# Patient Record
Sex: Female | Born: 1965 | Race: White | Hispanic: No | Marital: Married | Smoking: Never smoker
Health system: Southern US, Community
[De-identification: ages and names within clinical notes are randomized; demographics above are authoritative.]

## PROBLEM LIST (undated history)

## (undated) DIAGNOSIS — Z8614 Personal history of Methicillin resistant Staphylococcus aureus infection: Secondary | ICD-10-CM

## (undated) DIAGNOSIS — D649 Anemia, unspecified: Secondary | ICD-10-CM

## (undated) DIAGNOSIS — F419 Anxiety disorder, unspecified: Secondary | ICD-10-CM

## (undated) DIAGNOSIS — M75120 Complete rotator cuff tear or rupture of unspecified shoulder, not specified as traumatic: Secondary | ICD-10-CM

## (undated) DIAGNOSIS — R4589 Other symptoms and signs involving emotional state: Secondary | ICD-10-CM

## (undated) DIAGNOSIS — F329 Major depressive disorder, single episode, unspecified: Secondary | ICD-10-CM

## (undated) DIAGNOSIS — F322 Major depressive disorder, single episode, severe without psychotic features: Secondary | ICD-10-CM

## (undated) DIAGNOSIS — K56609 Unspecified intestinal obstruction, unspecified as to partial versus complete obstruction: Secondary | ICD-10-CM

## (undated) DIAGNOSIS — F32A Depression, unspecified: Secondary | ICD-10-CM

## (undated) DIAGNOSIS — K219 Gastro-esophageal reflux disease without esophagitis: Secondary | ICD-10-CM

## (undated) DIAGNOSIS — Z9889 Other specified postprocedural states: Secondary | ICD-10-CM

## (undated) DIAGNOSIS — J302 Other seasonal allergic rhinitis: Secondary | ICD-10-CM

## (undated) DIAGNOSIS — R112 Nausea with vomiting, unspecified: Secondary | ICD-10-CM

## (undated) DIAGNOSIS — R4689 Other symptoms and signs involving appearance and behavior: Secondary | ICD-10-CM

## (undated) DIAGNOSIS — C44519 Basal cell carcinoma of skin of other part of trunk: Secondary | ICD-10-CM

## (undated) DIAGNOSIS — J189 Pneumonia, unspecified organism: Secondary | ICD-10-CM

## (undated) DIAGNOSIS — N261 Atrophy of kidney (terminal): Secondary | ICD-10-CM

## (undated) DIAGNOSIS — A159 Respiratory tuberculosis unspecified: Secondary | ICD-10-CM

## (undated) DIAGNOSIS — M1990 Unspecified osteoarthritis, unspecified site: Secondary | ICD-10-CM

## (undated) DIAGNOSIS — K589 Irritable bowel syndrome without diarrhea: Secondary | ICD-10-CM

## (undated) HISTORY — PX: CARPAL TUNNEL RELEASE: SHX101

## (undated) HISTORY — DX: Other symptoms and signs involving emotional state: R45.89

## (undated) HISTORY — PX: DILATION AND CURETTAGE OF UTERUS: SHX78

## (undated) HISTORY — PX: COLONOSCOPY: SHX174

## (undated) HISTORY — PX: TUBAL LIGATION: SHX77

## (undated) HISTORY — DX: Major depressive disorder, single episode, severe without psychotic features: F32.2

## (undated) HISTORY — PX: HEMORROIDECTOMY: SUR656

## (undated) HISTORY — DX: Other symptoms and signs involving appearance and behavior: R46.89

## (undated) HISTORY — DX: Basal cell carcinoma of skin of other part of trunk: C44.519

## (undated) HISTORY — PX: ABDOMINAL HYSTERECTOMY: SHX81

## (undated) HISTORY — PX: ERCP: SHX60

---

## 2000-10-11 HISTORY — PX: CHOLECYSTECTOMY: SHX55

## 2004-01-23 ENCOUNTER — Ambulatory Visit (HOSPITAL_COMMUNITY): Admission: RE | Admit: 2004-01-23 | Discharge: 2004-01-23 | Payer: Self-pay | Admitting: Internal Medicine

## 2004-01-29 ENCOUNTER — Observation Stay (HOSPITAL_COMMUNITY): Admission: RE | Admit: 2004-01-29 | Discharge: 2004-01-30 | Payer: Self-pay | Admitting: Internal Medicine

## 2004-12-07 ENCOUNTER — Ambulatory Visit: Payer: Self-pay | Admitting: Internal Medicine

## 2005-10-11 HISTORY — PX: DIAGNOSTIC LAPAROSCOPY: SUR761

## 2006-09-13 ENCOUNTER — Ambulatory Visit (HOSPITAL_COMMUNITY): Admission: RE | Admit: 2006-09-13 | Discharge: 2006-09-13 | Payer: Self-pay | Admitting: Internal Medicine

## 2006-09-13 ENCOUNTER — Ambulatory Visit: Payer: Self-pay | Admitting: Internal Medicine

## 2006-09-16 ENCOUNTER — Ambulatory Visit: Payer: Self-pay | Admitting: Internal Medicine

## 2006-09-20 ENCOUNTER — Encounter (INDEPENDENT_AMBULATORY_CARE_PROVIDER_SITE_OTHER): Payer: Self-pay | Admitting: Specialist

## 2006-09-20 ENCOUNTER — Ambulatory Visit: Payer: Self-pay | Admitting: Internal Medicine

## 2006-09-21 ENCOUNTER — Inpatient Hospital Stay (HOSPITAL_COMMUNITY): Admission: RE | Admit: 2006-09-21 | Discharge: 2006-09-25 | Payer: Self-pay | Admitting: Internal Medicine

## 2006-09-29 ENCOUNTER — Ambulatory Visit (HOSPITAL_COMMUNITY): Admission: RE | Admit: 2006-09-29 | Discharge: 2006-09-29 | Payer: Self-pay | Admitting: Internal Medicine

## 2006-10-06 ENCOUNTER — Ambulatory Visit: Payer: Self-pay | Admitting: Internal Medicine

## 2006-10-11 DIAGNOSIS — Z8614 Personal history of Methicillin resistant Staphylococcus aureus infection: Secondary | ICD-10-CM

## 2006-10-11 HISTORY — DX: Personal history of Methicillin resistant Staphylococcus aureus infection: Z86.14

## 2007-01-16 ENCOUNTER — Ambulatory Visit: Payer: Self-pay | Admitting: Internal Medicine

## 2009-04-15 ENCOUNTER — Ambulatory Visit (HOSPITAL_COMMUNITY): Admission: RE | Admit: 2009-04-15 | Discharge: 2009-04-15 | Payer: Self-pay | Admitting: Internal Medicine

## 2010-07-21 ENCOUNTER — Ambulatory Visit: Payer: Self-pay | Admitting: Internal Medicine

## 2010-08-24 ENCOUNTER — Ambulatory Visit (HOSPITAL_COMMUNITY): Admission: RE | Admit: 2010-08-24 | Discharge: 2010-08-24 | Payer: Self-pay | Admitting: Internal Medicine

## 2010-10-31 ENCOUNTER — Encounter (INDEPENDENT_AMBULATORY_CARE_PROVIDER_SITE_OTHER): Payer: Self-pay | Admitting: Internal Medicine

## 2011-02-26 NOTE — Op Note (Signed)
NAMERUNA, WHITTINGHAM               ACCOUNT NO.:  [id number]   MEDICAL RECORD NO.:  [id number]          PATIENT TYPE:  OBV   LOCATION:  A337                          FACILITY:  [facility]   PHYSICIAN:  R. Roetta Sessions, M.D. DATE OF BIRTH:  [birth year]   DATE OF PROCEDURE:  09/20/2006  DATE OF DISCHARGE:                               OPERATIVE REPORT   PROCEDURE:  ERCP with sphincterotomy.   INDICATIONS FOR PROCEDURE:  The patient is a 45 year old lady status  post distant cholecystectomy.  She was seen by [place] in early 2005 for  papillary stenosis and intermittent biliary obstruction.  She underwent  ERCP with sphincterotomy.  This alleviated her biliary symptoms  completely until recently when she has had recurrent, nearly identical,  biliary type pain with a temporal significant bump in her transaminases.   An ultrasound of the biliary tree revealed some central intrahepatic  biliary dilation.  She comes for ERCP with probable sphincterotomy as  appropriate.  This approach has been discussed with the patient and the  patient's husband at length last week and again today at the bedside.  We talked about the increased risk of pancreatitis in this setting post  procedure, risk of perforation, bleeding, reaction to medications.  Her  questions were answered, she is agreeable.  I offered her a referral in  to [facility] for intervention with biliary manometry, etc. and  she wished to have biliary decompression done here.   PROCEDURE NOTE:  The patient was placed under general anesthesia by Dr.  [facility].  The patient received Levaquin 250 mg IV prior  to the procedure.   INSTRUMENT:  Olympus video chip side-viewing duodenoscope.   FINDINGS:  Cursory examination of the distal esophagus, stomach and  duodenum through the second portion was undertaken.  The ampulla of  Vater was readily identified on the medial wall of the second portion of  the duodenum.  The scope was  pulled back to the short position 55 cm  from the incisors, scout film was taken.  Examination of the ampulla and  periampullary tissue revealed some adenomatous appearing periampullary  mucosa.  The papilla itself was pinpoint and there was no evidence of  prior sphincterotomy.  Using the __________ sphincterotome, the ampulla  was approached and using guide wire palpation a deep cannulation of the  guide wire fairly rapidly obtained.  As the sphincterotome passed  through the ampullary orifice, the thick stenosis was noted to become  dilated and there was a gush of bile with advancement with the ever so  slightly __________ sphincterotome across this orifice; please see  photos.  A cholangiogram was performed.  The biliary tree appeared  normal status post cholecystectomy, although it was noted not to drain  very well.  The sphincterotome was pulled across the ampullary orifice  at 12 o'clock position and, using the Erbe Unit, a generous  sphincterotomy was performed with excellent flow of dark yellow bile.  Contrast was reinjected into the biliary tree and the scope was pulled  back to the stomach and we watched rapid emptying of the  biliary tree.  There was no filling defect or stricture seen.  Subsequently, the  duodenoscope was reintroduced into the duodenum and biopsies of  periampullary tissue were taken for histologic study.   The pancreatic duct was not manipulated, i.e. injected (it was not  injected or cannulated).  The patient tolerated the procedure well and  is being taken to the recovery room.   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  The stenotic papilla was  somewhat dilated with passage of the sphincterotome.  Normal  cholangiogram status post cholecystectomy, although the biliary tree  would not drain well.  Status post endoscopic sphincterotomy and  periampullary tissue biopsy at the  conclusion of the procedure.  Pancreatic duct not cannulated or injected.   RECOMMENDATIONS:  1. Twenty-four hour observation.  Will advance her diet later today.      If she is doing well, will recheck labs tomorrow morning.  2. No aspirin or arthritis medications for the next 10 days.      Jonathon Bellows, M.D.  Electronically Signed     RMR/MEDQ  D:  09/20/2006  T:  09/20/2006  Job:  [id number]   cc:   Fara Chute  Fax: [phone]

## 2011-02-26 NOTE — H&P (Signed)
NAMEANSHU, WEHNER               ACCOUNT NO.:  [id number]   MEDICAL RECORD NO.:  [id number]          PATIENT TYPE:  OUT   LOCATION:  RAD                           FACILITY:  [facility]   PHYSICIAN:  R. Roetta Sessions, M.D. DATE OF BIRTH:  [birth year]   DATE OF ADMISSION:  09/29/2006  DATE OF DISCHARGE:  LH                              HISTORY & PHYSICAL   Ms. Cordle had an ERCP sphincterotomy in the last week for a papillary  stenosis.  She had pain postprocedure and found to have retroperitoneal  air on a CT.  She was treated with NG tube decompression and n.p.o.  status.  Repeat CT five days ago demonstrated less retroperitoneal air  but no sign of extravasation of oral contrast, etc.  Clinically, she  improved and was discharged.  She called the office today stating she  was still having some pain similar to that immediately post ERCP but it  had lessened significantly.  She had stopped taking any pain  medications, no fever, no chills.  She was at work when she called.  Given the potential complicated nature of this scenario, I have had her  come over to [facility] and she had a CT with IV and oral  contrast which I personally reviewed with Dr. Alver Fisher.  This study looks  really good.  Almost all her retroperitoneal has resolved.  No fluid  collection, no abscess and there was air in the biliary tree.  Her CBC  is reported to be entirely normal which I do not the report yet for  review.  I have seen her today in the surgery area.  She looks well.  Temperature 98, pulse 82, respiratory rate 18, BP 129/81.  She is not  jaundiced.  Abdomen is flat and soft.  She has a very minimal if any  right upper quadrant abdominal tenderness.   ASSESSMENT:  Resolving retroperitoneal irritation/perforation.  This has  more than likely sealed several days ago.  She continues on Cipro and  Flagyl.  She is eating.  We called in a prescription for Vicodin 5/500  one q.6h.  She has Darvocet on hand  for migraine headaches.  She wants  to use Darvocet and use Vicodin for a backup.  Plan to see her back in  the office next week.      Jonathon Bellows, M.D.  Electronically Signed     RMR/MEDQ  D:  09/29/2006  T:  09/29/2006  Job:  [id number]   cc:   Patient's chart

## 2011-02-26 NOTE — Discharge Summary (Signed)
Marie Hurley, WEIGAND               ACCOUNT NO.:  [id number]   MEDICAL RECORD NO.:  [id number]          PATIENT TYPE:  INP   LOCATION:  A337                          FACILITY:  [facility]   PHYSICIAN:  Kassie Mends, M.D.      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   HOSPITAL COURSE:  Ms. Fojtik is a 45 year old female who has a  significant past medical history of having a cholecystectomy.  She was  seen in 2005 for papillary stenosis.  She had an ERCP with  sphincterotomy which alleviated her biliary symptoms.  She began to have  identical biliary type pain, with a bump in her transaminases.  An  ultrasound revealed central intrahepatic biliary dilatation.  She  presented for ERCP on September 20, 2006.  On the day postprocedure, she  developed nausea, vomiting, and abdominal pain.  She was afebrile and  hemodynamically stable.  CT scan of the abdomen and pelvis revealed  extensive retroperitoneal gas in the upper abdomen, predominantly in the  right anterior perirenal and perinephric spaces.  An NG tube was placed.  Surgery was consulted.  She was started on Cipro and Flagyl.  Her white  count was elevated at 11.9, with 85% neutrophils.  She remained on Cipro  and Flagyl and n.p.o. until repeat CT scan was performed on hospital day  4.  The CT scan of the abdomen without IV contrast but with p.o.  contrast again showed extensive retroperitoneal air which had decreased  and no extravasation of oral contrast.  The NG tube was removed, and her  diet was advanced.  On the day of discharge, she had no nausea,  vomiting, or abdominal pain.  She was tolerating a regular diet.  She  was  not requiring any antiemetics or pain medicine.  She was afebrile  and hemodynamically stable.  Her white count was 6.9.  She did have a  potassium of 3.2.  She is discharged on oral potassium supplementation  and continued on antibiotics to complete a 7-day course.   DISCHARGE MEDICATIONS:  1. Cipro 500 mg p.o. q.12 h. for 3 days.  2. Flagyl 500 mg q.8 h. for 3 days.  3. Compazine 10 mg q.8 h. as needed for nausea and vomiting.   FOLLOWUP:  Dr. Jena Gauss will contact Ms. Delford Field to schedule a follow-up  appointment.      Kassie Mends, M.D.  Electronically Signed     SM/MEDQ  D:  09/26/2006  T:  09/27/2006  Job:  [id number]   cc:   Fara Chute  Fax: [phone]   R. Roetta Sessions, M.D.  [address]  [place]  [place] [address]

## 2011-02-26 NOTE — Op Note (Signed)
NAME:  Marie Hurley, Marie Hurley                         ACCOUNT NO.:  [id number]   MEDICAL RECORD NO.:  [id number]                   PATIENT TYPE:  AMB   LOCATION:  DAY                                  FACILITY:  [facility]   PHYSICIAN:  R. Roetta Sessions, M.D.              DATE OF BIRTH:  Jan 10, 1966   DATE OF PROCEDURE:  DATE OF DISCHARGE:                                 OPERATIVE REPORT   PROCEDURE:  Diagnostic endoscopic retrograde cholangiopancreatography (ERCP)  with sphincterotomy and balloon dredging of bile duct.   INDICATIONS FOR PROCEDURE:  The patient is a 45 year old lady with right  upper quadrant biliary-type pain; and, on at least 2 episodes of significant  elevation in her transaminases was noted.  MRCP suggested a meniscus/cutoff  distal CBD suggestive of a stone.  She has continued to have waxing and  waning symptoms even through repeat LFTs came back normal.  ERCP is now  being done to further evaluate her symptoms.  I talked to her and her  husband at length about the likelihood of sphincterotomy with balloon  dredging of the bile duct, potential for pancreatic duct stent placement.  We talked about at least a 1 in 10 chance of pancreatitis as well as a risk  of perforation, bleeding, interaction to medications.  All questions were  answered.  All parties were agreeable.  Please see the documentation in the  medical record.   PROCEDURE NOTE:  The patient was placed in semi-prone position on the  fluoroscopy table in the radiology department.  O2 saturation, blood  pressure, and pulse oximetry were monitored throughout the entire procedure.   CONSCIOUS SEDATION:  Versed 5 mg IV, Demerol 100 mg IV in divided doses.  Cetacaine spray for topical oropharyngeal anesthesia.  Levaquin 500 mg IV  prior to procedure.   INSTRUMENT:  Olympus, video chip, therapeutic, side-viewing duodenoscope.   FINDINGS:  A cursory examination of the distal esophagus, stomach, and  duodenum revealed no  abnormalities. The ampulla of Vater was readily  identified on the medial wall of the second portion of the duodenum.  It was  somewhat bulging, but otherwise appeared normal.  The scope was pulled back  to the short position, 55 cm from the incisors.  A scout film was taken.  Using the Microvasive sphincterotome the ampulla was impacted and a deep  cannulation of the biliary tree was easily accomplished.   Cholangiogram was performed.  Cholangiogram revealed an undilated biliary  tree.  There were no obvious filling defects seen initially status post  cholecystectomy.  The pancreatic duct could not be injected.  The catheter  was pulled out of the biliary tree and the biliary tree was observed. It  appeared not to drain well at all.  Subsequently the sphincter and catheter  was reintroduced into the bile duct.  A 0.035 safety wire was placed deep in  the biliary tree.  Under fluoroscopic  control a sphincterotomy was performed  at the 12 o'clock position using the Erbie unit.  This was done without  difficulty.  There appeared to be a very good flow of contrast and bile  after this maneuver was accomplished.  I railed a graduated balloon deep  into the biliary tree at the level of the common hepatic duct and partially  inflated it and did a balloon occlusion cholangiogram. No stone material was  recovered; but, again, the bile duct appeared to drain very well.   I was planing to put a pancreatic duct stent in to protect the pancreas;  however. Because the biliary portion of the procedure went very well and I  was unable to initially inject the pancreatic duct, I elected not to pursue  a pancreatogram/PD stent placement.  The patient tolerated the relatively  brief procedure well and was reacted in the radiology unit.   IMPRESSION:  Somewhat bulging appearing ampulla of Vater.  Cholangiogram  appeared normal, status post cholecystectomy.  Bile duct appeared not to  drain spontaneously  status post endoscopic sphincterotomy with balloon  dredging.  Pancreatic duct not injected.   RECOMMENDATIONS:  1. A 15-minute delayed film pending.  2. Review of all films with the radiologist, pending.   PLAN:  1. Plan will be overnight observation.  2. Recheck amylase and LFTs tomorrow morning.  3. Further recommendations to follow.      ___________________________________________                                            Jonathon Bellows, M.D.   RMR/MEDQ  D:  01/29/2004  T:  01/29/2004  Job:  [id number]   cc:   Almetta Lovely  [address], [address]  [place]  [place] [address]  Fax: [phone]   Fara Chute  [address] [place]  [place] [address]  Fax: [phone]   R. Roetta Sessions, M.D.  [address]  [place]  [place] [address]  Fax: [phone]

## 2011-03-01 ENCOUNTER — Ambulatory Visit (INDEPENDENT_AMBULATORY_CARE_PROVIDER_SITE_OTHER): Payer: PRIVATE HEALTH INSURANCE | Admitting: Internal Medicine

## 2011-03-01 DIAGNOSIS — R1011 Right upper quadrant pain: Secondary | ICD-10-CM

## 2011-03-01 DIAGNOSIS — R1013 Epigastric pain: Secondary | ICD-10-CM

## 2011-03-01 DIAGNOSIS — K589 Irritable bowel syndrome without diarrhea: Secondary | ICD-10-CM

## 2011-04-12 ENCOUNTER — Ambulatory Visit (INDEPENDENT_AMBULATORY_CARE_PROVIDER_SITE_OTHER): Payer: Self-pay | Admitting: Internal Medicine

## 2011-06-07 ENCOUNTER — Other Ambulatory Visit (INDEPENDENT_AMBULATORY_CARE_PROVIDER_SITE_OTHER): Payer: Self-pay | Admitting: *Deleted

## 2011-06-07 DIAGNOSIS — R1011 Right upper quadrant pain: Secondary | ICD-10-CM

## 2011-06-07 DIAGNOSIS — R194 Change in bowel habit: Secondary | ICD-10-CM

## 2011-06-07 MED ORDER — NORTRIPTYLINE HCL 10 MG PO CAPS: ORAL_CAPSULE | ORAL | Status: DC

## 2011-06-07 NOTE — Telephone Encounter (Signed)
Prescription last filled 04/23/2011

## 2011-06-25 ENCOUNTER — Telehealth (INDEPENDENT_AMBULATORY_CARE_PROVIDER_SITE_OTHER): Payer: Self-pay | Admitting: *Deleted

## 2011-06-25 NOTE — Telephone Encounter (Signed)
Toshi left message, wants [name] to call, she has question about a couple of prescriptions, wants you to call her today

## 2011-06-25 NOTE — Telephone Encounter (Signed)
Rec'd fax request from [facility] for refills on Clonazepam 1 mg #90 - 1 Tablet Three Times daily As Needed- last filled 05/25/2011, and Hydrocodone/APAP/5/500 #20 - Take 1 Tablet Once Daily as Needed for Migraine Headache. Last filled 05/26/11. I gave this to Delrae Rend to address and she called Dr. Karilyn Cota. She states that per Dr. Karilyn Cota the patient will need to have PCP address these prescriptions. I called [place] and made her aware.

## 2011-07-11 ENCOUNTER — Emergency Department (HOSPITAL_COMMUNITY)
Admission: EM | Admit: 2011-07-11 | Discharge: 2011-07-11 | Disposition: A | Discharge status: Home / Self Care | Payer: PRIVATE HEALTH INSURANCE | Attending: Emergency Medicine | Admitting: Emergency Medicine

## 2011-07-11 ENCOUNTER — Emergency Department (HOSPITAL_COMMUNITY): Payer: PRIVATE HEALTH INSURANCE

## 2011-07-11 ENCOUNTER — Encounter: Payer: Self-pay | Admitting: *Deleted

## 2011-07-11 DIAGNOSIS — G43909 Migraine, unspecified, not intractable, without status migrainosus: Secondary | ICD-10-CM | POA: Insufficient documentation

## 2011-07-11 DIAGNOSIS — Z79899 Other long term (current) drug therapy: Secondary | ICD-10-CM | POA: Insufficient documentation

## 2011-07-11 DIAGNOSIS — R1084 Generalized abdominal pain: Secondary | ICD-10-CM | POA: Insufficient documentation

## 2011-07-11 DIAGNOSIS — K219 Gastro-esophageal reflux disease without esophagitis: Secondary | ICD-10-CM | POA: Insufficient documentation

## 2011-07-11 DIAGNOSIS — R630 Anorexia: Secondary | ICD-10-CM | POA: Insufficient documentation

## 2011-07-11 DIAGNOSIS — K589 Irritable bowel syndrome without diarrhea: Secondary | ICD-10-CM | POA: Insufficient documentation

## 2011-07-11 HISTORY — DX: Gastro-esophageal reflux disease without esophagitis: K21.9

## 2011-07-11 HISTORY — DX: Unspecified intestinal obstruction, unspecified as to partial versus complete obstruction: K56.609

## 2011-07-11 HISTORY — DX: Irritable bowel syndrome, unspecified: K58.9

## 2011-07-11 LAB — DIFFERENTIAL
Basophils Absolute: 0 10*3/uL (ref 0.0–0.1)
Basophils Relative: 1 % (ref 0–1)
Eosinophils Relative: 6 % — ABNORMAL HIGH (ref 0–5)
Lymphocytes Relative: 50 % — ABNORMAL HIGH (ref 12–46)
Neutro Abs: 2.9 10*3/uL (ref 1.7–7.7)

## 2011-07-11 LAB — CBC
HCT: 36.4 % (ref 36.0–46.0)
Hemoglobin: 12.2 g/dL (ref 12.0–15.0)
MCH: 30 pg (ref 26.0–34.0)
MCHC: 33.5 g/dL (ref 30.0–36.0)
MCV: 89.4 fL (ref 78.0–100.0)
Platelets: 388 K/uL (ref 150–400)
RBC: 4.07 MIL/uL (ref 3.87–5.11)
RDW: 13.4 % (ref 11.5–15.5)
WBC: 8.2 K/uL (ref 4.0–10.5)

## 2011-07-11 LAB — BASIC METABOLIC PANEL WITH GFR
BUN: 4 mg/dL — ABNORMAL LOW (ref 6–23)
CO2: 32 meq/L (ref 19–32)
Calcium: 9.8 mg/dL (ref 8.4–10.5)
Chloride: 101 meq/L (ref 96–112)
Creatinine, Ser: 0.83 mg/dL (ref 0.50–1.10)
GFR calc Af Amer: >60 mL/min (ref >60)
GFR calc non Af Amer: >60 mL/min (ref >60)
Glucose, Bld: 86 mg/dL (ref 70–99)
Potassium: 3.7 meq/L (ref 3.5–5.1)
Sodium: 140 meq/L (ref 135–145)

## 2011-07-11 LAB — HEPATIC FUNCTION PANEL
ALT: 19 U/L (ref 0–35)
AST: 17 U/L (ref 0–37)
Albumin: 3.9 g/dL (ref 3.5–5.2)
Alkaline Phosphatase: 58 U/L (ref 39–117)
Bilirubin, Direct: 0.1 mg/dL (ref 0.0–0.3)
Indirect Bilirubin: 0.1 mg/dL — ABNORMAL LOW (ref 0.3–0.9)
Total Bilirubin: 0.2 mg/dL — ABNORMAL LOW (ref 0.3–1.2)
Total Protein: 7.1 g/dL (ref 6.0–8.3)

## 2011-07-11 LAB — URINALYSIS, ROUTINE W REFLEX MICROSCOPIC
Bilirubin Urine: NEGATIVE
Glucose, UA: NEGATIVE mg/dL
Hgb urine dipstick: NEGATIVE
Ketones, ur: NEGATIVE mg/dL
Leukocytes, UA: NEGATIVE
Nitrite: NEGATIVE
Protein, ur: NEGATIVE mg/dL
Specific Gravity, Urine: 1.01 (ref 1.005–1.030)
Urobilinogen, UA: 0.2 mg/dL (ref 0.0–1.0)
pH: 7.5 (ref 5.0–8.0)

## 2011-07-11 MED ORDER — SODIUM CHLORIDE 0.9 % IV SOLN
999.0000 mL | Freq: Once | INTRAVENOUS | Status: AC
  Administered 2011-07-11: 999 mL via INTRAVENOUS

## 2011-07-11 MED ORDER — ONDANSETRON HCL 4 MG/2ML IJ SOLN
4.0000 mg | Freq: Once | INTRAMUSCULAR | Status: AC
  Administered 2011-07-11: 4 mg via INTRAVENOUS
  Filled 2011-07-11: qty 2

## 2011-07-11 MED ORDER — HYDROMORPHONE HCL 1 MG/ML IJ SOLN
1.0000 mg | Freq: Once | INTRAMUSCULAR | Status: AC
  Administered 2011-07-11: 1 mg via INTRAVENOUS
  Filled 2011-07-11: qty 1

## 2011-07-11 MED ORDER — ONDANSETRON HCL 4 MG PO TABS: 4.0000 mg | ORAL_TABLET | Freq: Four times a day (QID) | ORAL | Status: AC

## 2011-07-11 NOTE — ED Notes (Signed)
Pt has significant hx of bowel obstructions per pt.

## 2011-07-11 NOTE — ED Notes (Signed)
Upper abd pain since 1500, nausea

## 2011-07-11 NOTE — ED Provider Notes (Signed)
History     CSN: [id number] Arrival date & time: 07/11/2011  3:29 AM  Chief Complaint  Patient presents with  . Abdominal Pain    (Consider location/radiation/quality/duration/timing/severity/associated sxs/prior treatment) HPI Comments: Examined at 36.  Patient is a 45 y.o. female presenting with abdominal pain. The history is provided by the patient.  Abdominal Pain The primary symptoms of the illness include abdominal pain. The current episode started 3 to 5 hours ago. The onset of the illness was sudden. The problem has not changed since onset. The abdominal pain is located in the LUQ, RUQ and epigastric region. The abdominal pain does not radiate. The severity of the abdominal pain is 9/10. The abdominal pain is relieved by nothing. The abdominal pain is exacerbated by coughing and deep breathing.  The patient states that she believes she is currently not pregnant. The patient has not had a change in bowel habit. Additional symptoms associated with the illness include anorexia. Symptoms associated with the illness do not include chills, diaphoresis, heartburn, urgency, hematuria or back pain. Significant associated medical issues include GERD.    Past Medical History  Diagnosis Date  . Bowel obstruction   . IBS (irritable bowel syndrome)   . GERD (gastroesophageal reflux disease)   . Migraine     Past Surgical History  Procedure Date  . Tubal ligation   . Cholecystectomy   . Carpal tunnel release   . Ercp     No family history on file.  History  Substance Use Topics  . Smoking status: Never Smoker   . Smokeless tobacco: Not on file  . Alcohol Use: 0.5 oz/week    1 drink(s) per week    OB History    Grav Para Term Preterm Abortions TAB SAB Ect Mult Living                  Review of Systems  Constitutional: Negative for chills and diaphoresis.  Gastrointestinal: Positive for abdominal pain and anorexia. Negative for heartburn.  Genitourinary: Negative for  urgency and hematuria.  Musculoskeletal: Negative for back pain.  All other systems reviewed and are negative.    Allergies  Penicillins and Phenergan  Home Medications   Current Outpatient Rx  Name Route Sig Dispense Refill  . CLONAZEPAM 1 MG PO TABS Oral Take 1 mg by mouth 3 (three) times daily as needed.      Marland Kitchen DICYCLOMINE HCL 10 MG PO CAPS Oral Take 10 mg by mouth 4 (four) times daily -  before meals and at bedtime.      Marland Kitchen ESOMEPRAZOLE MAGNESIUM 40 MG PO CPDR Oral Take 40 mg by mouth 2 times daily at 12 noon and 4 pm.      . HYDROCODONE-ACETAMINOPHEN 5-500 MG PO CAPS Oral Take 1 capsule by mouth every 6 (six) hours as needed.      Marland Kitchen HYOSCYAMINE SULFATE 0.375 MG PO TB12 Oral Take 0.375 mg by mouth every 12 (twelve) hours as needed.      Marland Kitchen NORTRIPTYLINE HCL 10 MG PO CAPS Oral Take 10 mg by mouth at bedtime.      Marland Kitchen NORTRIPTYLINE HCL 10 MG PO CAPS  Take 1 Capsule In The Morning And 2 In The Evening 90 capsule 3    BP 140/88  Pulse 89  Temp(Src) 97.8 F (36.6 C) (Oral)  Resp 15  Ht 5\' 3"  (1.6 m)  Wt 127 lb (57.607 kg)  BMI 22.50 kg/m2  SpO2 100%  LMP 06/24/2011  Physical Exam  Nursing note and vitals reviewed. Constitutional: She is oriented to person, place, and time. She appears well-developed and well-nourished. She appears distressed.  HENT:  Head: Normocephalic.  Eyes: EOM are normal.  Neck: Normal range of motion. Neck supple.  Cardiovascular: Normal rate, normal heart sounds and intact distal pulses.   Pulmonary/Chest: Effort normal and breath sounds normal.  Abdominal: Soft. She exhibits no mass. There is tenderness. There is no rebound and no guarding.  Musculoskeletal: Normal range of motion.  Neurological: She is alert and oriented to person, place, and time.  Skin: Skin is warm and dry.    ED Course  Procedures (including critical care time)  Labs Reviewed  DIFFERENTIAL - Abnormal; Notable for the following:    Neutrophils Relative 36 (*)     Lymphocytes Relative 50 (*)    Lymphs Abs 4.1 (*)    Eosinophils Relative 6 (*)    All other components within normal limits  BASIC METABOLIC PANEL - Abnormal; Notable for the following:    BUN 4 (*)    All other components within normal limits  HEPATIC FUNCTION PANEL - Abnormal; Notable for the following:    Total Bilirubin 0.2 (*)    Indirect Bilirubin 0.1 (*)    All other components within normal limits  CBC  URINALYSIS, ROUTINE W REFLEX MICROSCOPIC   Dg Abd Acute W/chest  07/11/2011  *RADIOLOGY REPORT*  Clinical Data: Abdominal pain and nausea.  ACUTE ABDOMEN SERIES (ABDOMEN 2 VIEW & CHEST 1 VIEW)  Comparison: CT 09/29/2006  Findings: Normal heart size and pulmonary vascularity.  No focal consolidation in the lungs.  No blunting of costophrenic angles.  Scattered gas and stool in the colon.  Scattered air-fluid levels in the colon suggesting liquid stool.  No small or large bowel dilatation.  No free intra-abdominal air.  No radiopaque stones.  IMPRESSION: No evidence of active pulmonary disease.  Nonobstructive bowel gas pattern.  Liquid stool in the colon.  Original Report Authenticated By: Marlon Pel, M.D.     No diagnosis found.    Patient with abdominal pain that began last night associated with nausea. No vomiting, diarrhea, fever, chills, back pain. Patient with h/o IBS and GERD. Labs unremarkable, xrays with colon suggesting liquid stool. Patient given IVF, antiemetic and analgesic with relief of pain. Reviewed results with patient.Pt stable in ED with no significant deterioration in condition.Pt feels improved after observation and/or treatment in ED. MDM Reviewed: previous chart, nursing note and vitals Reviewed previous: labs and x-ray Interpretation: labs and x-ray Total time providing critical care: 40.          Nicoletta Dress. Colon Branch, MD 07/11/11 1610

## 2011-08-25 ENCOUNTER — Telehealth (INDEPENDENT_AMBULATORY_CARE_PROVIDER_SITE_OTHER): Payer: Self-pay | Admitting: Internal Medicine

## 2011-08-25 NOTE — Telephone Encounter (Signed)
Needs her Nortriptyline refilled. Would like to see if the doctor would please up the mg. The return phone number is [phone].

## 2011-08-25 NOTE — Telephone Encounter (Signed)
I forwarded this to Terri to address as Dr. Karilyn Cota is out of the office.

## 2011-08-25 NOTE — Telephone Encounter (Signed)
I advised her to call Dr. Sherril Croon office for the increase

## 2011-09-06 ENCOUNTER — Ambulatory Visit (INDEPENDENT_AMBULATORY_CARE_PROVIDER_SITE_OTHER): Payer: PRIVATE HEALTH INSURANCE | Admitting: Internal Medicine

## 2011-09-13 ENCOUNTER — Other Ambulatory Visit: Payer: Self-pay | Admitting: Obstetrics & Gynecology

## 2011-09-23 ENCOUNTER — Other Ambulatory Visit (INDEPENDENT_AMBULATORY_CARE_PROVIDER_SITE_OTHER): Payer: Self-pay | Admitting: Internal Medicine

## 2011-09-23 DIAGNOSIS — K219 Gastro-esophageal reflux disease without esophagitis: Secondary | ICD-10-CM

## 2011-10-20 ENCOUNTER — Telehealth (INDEPENDENT_AMBULATORY_CARE_PROVIDER_SITE_OTHER): Payer: Self-pay | Admitting: *Deleted

## 2011-10-20 NOTE — Telephone Encounter (Signed)
Marie Hurley called and says that she is having abdominal tenderness. Recently had a Physical by her PCP and she felt that she Marie Hurley) needed to see another Gastroenterologist for further evaluation of this and also she needed a Surgical Referral for excessive hemorrhoids. Marie Hurley had an appointment with Gastroenterologist in [place] on the same day that she had an appointment here but canceled both. She says that she wants to stay with Dr. Karilyn Cota and ask if he would make a referral to a Surgeon. I told her that I would ask Dr. Karilyn Cota and get back to her. She also mentioned that the next appointment that she had been given here was 12-21-11.  I spoke with Dr.Rehman and he states that he will need to re-evaluate her before making an appointment for a Surgical Consult. An appointment has been given for January 21 @ 2:30 PM, Marie Hurley was called and made aware.

## 2011-11-01 ENCOUNTER — Ambulatory Visit (INDEPENDENT_AMBULATORY_CARE_PROVIDER_SITE_OTHER): Payer: PRIVATE HEALTH INSURANCE | Admitting: Internal Medicine

## 2011-11-01 ENCOUNTER — Other Ambulatory Visit (INDEPENDENT_AMBULATORY_CARE_PROVIDER_SITE_OTHER): Payer: Self-pay | Admitting: *Deleted

## 2011-11-01 ENCOUNTER — Encounter (INDEPENDENT_AMBULATORY_CARE_PROVIDER_SITE_OTHER): Payer: Self-pay | Admitting: Internal Medicine

## 2011-11-01 VITALS — BP 130/68 | HR 66 | Temp 97.8°F | Resp 12 | Ht 63.0 in | Wt 129.0 lb

## 2011-11-01 DIAGNOSIS — R1011 Right upper quadrant pain: Secondary | ICD-10-CM | POA: Insufficient documentation

## 2011-11-01 DIAGNOSIS — G43909 Migraine, unspecified, not intractable, without status migrainosus: Secondary | ICD-10-CM | POA: Insufficient documentation

## 2011-11-01 DIAGNOSIS — K649 Unspecified hemorrhoids: Secondary | ICD-10-CM | POA: Insufficient documentation

## 2011-11-01 DIAGNOSIS — R1010 Upper abdominal pain, unspecified: Secondary | ICD-10-CM | POA: Insufficient documentation

## 2011-11-01 DIAGNOSIS — R109 Unspecified abdominal pain: Secondary | ICD-10-CM

## 2011-11-01 DIAGNOSIS — K589 Irritable bowel syndrome without diarrhea: Secondary | ICD-10-CM

## 2011-11-01 MED ORDER — HYOSCYAMINE SULFATE ER 0.375 MG PO TB12: 375.0000 ug | ORAL_TABLET | Freq: Two times a day (BID) | ORAL | Status: DC

## 2011-11-01 MED ORDER — NORTRIPTYLINE HCL 25 MG PO CAPS: 25.0000 mg | ORAL_CAPSULE | Freq: Two times a day (BID) | ORAL | Status: DC

## 2011-11-01 NOTE — Patient Instructions (Signed)
Take hyoscyamine points 375 mg twice daily. Please note dicyclomine has been discontinued. Pamelor increased to 25 mg twice daily by mouth. Consultation with Dr. Malvin Johns regarding problem with hemorrhoids. Physician will contact you with results of CT.

## 2011-11-01 NOTE — Progress Notes (Signed)
Presenting complaint; Abdomen hemorrhoids. Recurrent upper abdominal pain. Subjective: Marie Hurley is 46 year old Caucasian female who is here for scheduled visit. She was last seen in may 2012 for recurrent RUQ and epigastric pain as well as irregular bowel movements. She continues to experience pain in her upper abdomen right often if not every day. Now she is also complaining of having problem with hemorrhoids. She has soreness in a recurrent rectal bleeding. She has tried various topicals but nothing has worked. She either has diarrhea and/or constipation and she has bleeding with either situation. She denies frank bleeding or melena. Her appetite is very good but she always watching her diet and staying on bland foods. Her weight has been stable since her last visit. 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 On spell in September 2012 landed her in emergency room when she was seen by Dr. Greta Doom and a blood work was normal including LFTs she's had 3 more spells of pain but she decided to stay at home. She generally recovers in 2-3 days. He also complains of flatulence and lower back pain in she's constipated she continues to have intermittent episodes of migraine which also seemed to trigger abdominal pain. She feels quite frustrated with her ongoing symptoms as she is unable to work. Current Medications: Current Outpatient Prescriptions  Medication Sig Dispense Refill  . Ascorbic Acid (VITAMIN C) 1000 MG tablet Take 1,000 mg by mouth daily.      Marland Kitchen b complex vitamins capsule Take 1 capsule by mouth daily.      . clonazePAM (KLONOPIN) 1 MG tablet Take 1 mg by mouth 3 (three) times daily as needed.        . dicyclomine (BENTYL) 10 MG capsule Take 10 mg by mouth 4 (four) times daily -  before meals and at bedtime.        . hydrocodone-acetaminophen (LORCET-HD) 5-500 MG  per capsule Take 1 capsule by mouth every 6 (six) hours as needed.        . hyoscyamine (LEVBID) 0.375 MG 12 hr tablet Take 0.375 mg by mouth every 12 (twelve) hours as needed.        . Multiple Vitamin (DAILY VITAMIN PO) Take by mouth daily.      Marland Kitchen NEXIUM 40 MG capsule TAKE (1) CAPSULE TWICE DAILY.  60 each  5  . nortriptyline (PAMELOR) 10 MG capsule Take 1 Capsule In The Morning And 2 In The Evening  90 capsule  3  . polyethylene glycol (MIRALAX / GLYCOLAX) packet Take 17 g by mouth 2 (two) times daily.        Objective: Blood pressure 130/68, pulse 66, temperature 97.8 F (36.6 C), temperature source Oral, resp. rate 12, height 5\' 3"  (1.6 m), weight 129 lb (58.514 kg), last menstrual period 09/23/2011. Conjunctiva is pink. Sclera is nonicteric Oral pharyngeal mucosa is normal. No neck masses or thyromegaly noted. Cardiac exam with regular rhythm normal S1 and S2. No murmur or gallop noted. Lungs are clear to auscultation. Abdomen is symmetrical. Bowel sounds are hyperactive. No bruits noted. On palpation soft abdomen with moderate tenderness across upper abdomen with some guarding in mid epigastric region. No superficial tenderness noted. No organomegaly or masses. Rectal examination reveals prominent skin tag and visible Cosa of the dentate line which is intensely erythematous no active bleeding noted. Digital exam reveals  no stool in the vault. No clubbing or edema noted. No LE edema or clubbing noted.  Assessment: #1. Recurrent hematochezia felt to be secondary to hemorrhoids; she appears to have both internal and external hemorrhoids. She will be referred for surgical opinion. Her last colonoscopy was about 10 years ago. Unfortunately this problem will come back unless her bowels move regularly. #2. Chronic upper abdominal pain with intermittent episodes of intractable right upper quadrant biliary type of pain but LFTs have been repeatedly normal. She had ultrasound in November 2011  revealing normal-size common bile duct. Some of her symptoms are suggestive of biliary colic and left-sided pain may be secondary to IBS. Please note patient had 2 ERCPs with sphincterotomy by Dr. Jena Gauss. First procedure provider Nedra Hai for about 2 years for the second one did not. She was sent over to [facility] over 2 years ago also seen by Dr. Parks Ranger group at [facility]. I believe her abdomen needs to be surveyed with CT to make sure she has developed chronic pancreatitis or other abnormalities.    Plan: Surgical consultation for further treatment of her hemorrhoids. Increase Pamelor to 25 mg by mouth twice a day which is primarily for her abdominal pain but may also help with migraine Abdominopelvic CT with contrast. I told patient that I will confer with Dr. Danny Lawless of [place] to determine if she would benefit from biliary manometry.

## 2011-11-08 ENCOUNTER — Ambulatory Visit (HOSPITAL_COMMUNITY): Payer: PRIVATE HEALTH INSURANCE

## 2011-11-08 ENCOUNTER — Telehealth (INDEPENDENT_AMBULATORY_CARE_PROVIDER_SITE_OTHER): Payer: Self-pay | Admitting: *Deleted

## 2011-11-08 NOTE — Telephone Encounter (Signed)
LM stating she would like to speak with Reba. She has questions about apts Dr. Karilyn Cota has made for her. The return phone number is [phone].

## 2011-11-09 NOTE — Telephone Encounter (Signed)
This really needs to go to [place] as I am unaware of the appts we made for patient while in the office.  I will have to talk with Dewayne Hatch or Dewayne Hatch can call patient to discuss these appts.  

## 2011-11-09 NOTE — Telephone Encounter (Signed)
Spoke to [place] and answered questions

## 2011-11-10 ENCOUNTER — Ambulatory Visit (HOSPITAL_COMMUNITY): Payer: PRIVATE HEALTH INSURANCE

## 2011-11-17 ENCOUNTER — Ambulatory Visit (HOSPITAL_COMMUNITY): Payer: PRIVATE HEALTH INSURANCE

## 2011-11-23 ENCOUNTER — Ambulatory Visit (INDEPENDENT_AMBULATORY_CARE_PROVIDER_SITE_OTHER): Payer: PRIVATE HEALTH INSURANCE | Admitting: Internal Medicine

## 2011-11-25 ENCOUNTER — Telehealth (INDEPENDENT_AMBULATORY_CARE_PROVIDER_SITE_OTHER): Payer: Self-pay | Admitting: *Deleted

## 2011-11-25 DIAGNOSIS — R140 Abdominal distension (gaseous): Secondary | ICD-10-CM

## 2011-11-25 DIAGNOSIS — K589 Irritable bowel syndrome without diarrhea: Secondary | ICD-10-CM

## 2011-11-25 NOTE — Telephone Encounter (Signed)
Patient left a voice message at 4:44 pm , stating that she is having another episode with her stomach. She wants to know if she should go for lab work or have done the CT that she has not had done yet or be worked in to see doctor tomorrow. At 4:50 pm I talked with Humna she states that she and her husband went out to eat for anniversary. As she was eating her stomach began to swell or expand. She says she looks like she is 4 months pregnant. Can't pass gas, had a small BM this morning and has the feeling that she needs to have BM but she can't. I told her that I would talk with Dr. Karilyn Cota and call her back. 5:02 pm - Per Dr. Karilyn Cota the patient needs to have LFT's drawn now. Lab order noted , faxed and patient made aware.

## 2011-11-26 LAB — HEPATIC FUNCTION PANEL
Albumin: 4.1 g/dL (ref 3.5–5.2)
Alkaline Phosphatase: 55 U/L (ref 39–117)
Bilirubin, Direct: 0.1 mg/dL (ref 0.0–0.3)
Indirect Bilirubin: 0.2 mg/dL (ref 0.0–0.9)
Total Bilirubin: 0.3 mg/dL (ref 0.3–1.2)

## 2011-12-02 ENCOUNTER — Ambulatory Visit (HOSPITAL_COMMUNITY): Payer: PRIVATE HEALTH INSURANCE

## 2011-12-07 ENCOUNTER — Ambulatory Visit (HOSPITAL_COMMUNITY): Payer: PRIVATE HEALTH INSURANCE

## 2011-12-09 ENCOUNTER — Telehealth (INDEPENDENT_AMBULATORY_CARE_PROVIDER_SITE_OTHER): Payer: Self-pay | Admitting: *Deleted

## 2011-12-09 NOTE — Telephone Encounter (Addendum)
Emaline left a voice message on 12-08-11. She is asking if we have heard anything from Dr. Fredric Mare as to whom he may want to refer her to to do the procedure,since she cannot go to [facility].She ask for a call at [phone].Forwarded to Dr. Karilyn Cota to address

## 2011-12-09 NOTE — Telephone Encounter (Signed)
I haven't heard anything, may need to send to Dr Karilyn Cota, he has talked to Dr Marilynne Drivers

## 2011-12-09 NOTE — Telephone Encounter (Signed)
Marie Hurley, please call [name]; patient needs ov with Dr. Logan Bores for ERCP with biliary manometry.

## 2011-12-09 NOTE — Telephone Encounter (Signed)
Caitrin called back and wants to sch'd appt to see Dr Karilyn Cota to talk about ERCP, appt sch'd 01/03/12, she is aware

## 2011-12-09 NOTE — Telephone Encounter (Signed)
Per [name], they don't do ERCP w/ biliary manometry, [facility] is the only one that does those, Kailah will think about it and let me know

## 2011-12-10 ENCOUNTER — Ambulatory Visit (HOSPITAL_COMMUNITY)
Admission: RE | Admit: 2011-12-10 | Discharge: 2011-12-10 | Disposition: A | Discharge status: Home / Self Care | Payer: PRIVATE HEALTH INSURANCE | Source: Ambulatory Visit | Attending: Internal Medicine | Admitting: Internal Medicine

## 2011-12-10 DIAGNOSIS — N83209 Unspecified ovarian cyst, unspecified side: Secondary | ICD-10-CM | POA: Insufficient documentation

## 2011-12-10 DIAGNOSIS — R1011 Right upper quadrant pain: Secondary | ICD-10-CM | POA: Insufficient documentation

## 2011-12-10 DIAGNOSIS — R1012 Left upper quadrant pain: Secondary | ICD-10-CM | POA: Insufficient documentation

## 2011-12-10 DIAGNOSIS — R1010 Upper abdominal pain, unspecified: Secondary | ICD-10-CM

## 2011-12-10 MED ORDER — IOHEXOL 300 MG/ML  SOLN
100.0000 mL | Freq: Once | INTRAMUSCULAR | Status: AC | PRN
  Administered 2011-12-10: 100 mL via INTRAVENOUS

## 2011-12-10 NOTE — Telephone Encounter (Signed)
Patient has office visit to discuss treatment options

## 2011-12-13 ENCOUNTER — Telehealth (INDEPENDENT_AMBULATORY_CARE_PROVIDER_SITE_OTHER): Payer: Self-pay | Admitting: *Deleted

## 2011-12-13 NOTE — Telephone Encounter (Signed)
addressed

## 2011-12-13 NOTE — Telephone Encounter (Addendum)
Mrs. Marie Hurley called our office leaving a message today. She wanted to know if we had received the test results from her CT done on 12-10-11. I will address this with Dr. Karilyn Cota then call the patient. Per Dr. Karilyn Cota CT normal. The patient has an appointment on 01-03-12 and at that time her condition will be discussed.Patient called and made aware.

## 2012-01-03 ENCOUNTER — Encounter (INDEPENDENT_AMBULATORY_CARE_PROVIDER_SITE_OTHER): Payer: Self-pay | Admitting: Internal Medicine

## 2012-01-03 ENCOUNTER — Ambulatory Visit (INDEPENDENT_AMBULATORY_CARE_PROVIDER_SITE_OTHER): Payer: PRIVATE HEALTH INSURANCE | Admitting: Internal Medicine

## 2012-01-03 VITALS — BP 108/78 | HR 74 | Temp 98.0°F | Resp 16 | Ht 63.0 in | Wt 125.4 lb

## 2012-01-03 DIAGNOSIS — K8020 Calculus of gallbladder without cholecystitis without obstruction: Secondary | ICD-10-CM

## 2012-01-03 DIAGNOSIS — K8050 Calculus of bile duct without cholangitis or cholecystitis without obstruction: Secondary | ICD-10-CM | POA: Insufficient documentation

## 2012-01-03 NOTE — Patient Instructions (Signed)
Will arrange for consultation with Dr. Gayland Curry off [facility].

## 2012-01-03 NOTE — Progress Notes (Signed)
Presenting complaint;   Followup for abdominal pain.  Subjective:  Marie Hurley is a 46 year old Caucasian female who is here to discuss results of blood work and abdominopelvic CT. She is accompanied by her sister and has multiple written questions. About 6 weeks ago she had LFTs following an episode of severe right upper quadrant pain. Transaminases are mildly elevated. On 12/10/2011 she had abdominopelvic CT and there was no evidence of biliary dilation. I had recommended an effort to tertiary Center for biliary manometry. She has had 2 ERCPs in the past and second ERCP was complicated by microperforation healed with medical therapy. She has done her own research by going online. She is interested in seeing Dr. Gayland Curry of [facility]. She continues to complain of intermittent postprandial pain which is centered in right lower quadrant but other times she has abdominal pain in other regions. She has daily abdominal pain  She denies vomiting fever or chills.  Current Medications: Current Outpatient Prescriptions  Medication Sig Dispense Refill  . Ascorbic Acid (VITAMIN C) 1000 MG tablet Take 1,000 mg by mouth daily.      Marland Kitchen b complex vitamins capsule Take 1 capsule by mouth daily.      . clonazePAM (KLONOPIN) 1 MG tablet Take 1 mg by mouth 3 (three) times daily as needed.        . fexofenadine (ALLEGRA) 180 MG tablet Take 180 mg by mouth daily.      . hydrocodone-acetaminophen (LORCET-HD) 5-500 MG per capsule Take 1 capsule by mouth every 6 (six) hours as needed.        . hydrocortisone (ANUSOL-HC) 2.5 % rectal cream Place 1 application rectally 2 (two) times daily.      . hyoscyamine (LEVBID) 0.375 MG 12 hr tablet Take 1 tablet (375 mcg total) by mouth 2 (two) times daily before a meal.  60 tablet  5  . Multiple Vitamin (DAILY VITAMIN PO) Take by mouth daily.      Marland Kitchen NEXIUM 40 MG capsule TAKE (1) CAPSULE TWICE DAILY.  60 each  5  . nortriptyline (PAMELOR) 25 MG capsule Take 1 capsule (25 mg  total) by mouth 2 (two) times daily.  60 capsule  5  . polyethylene glycol (MIRALAX / GLYCOLAX) packet Take 17 g by mouth 2 (two) times daily.         Objective: Blood pressure 108/78, pulse 74, temperature 98 F (36.7 C), temperature source Oral, resp. rate 16, height 5\' 3"  (1.6 m), weight 125 lb 6.4 oz (56.881 kg), last menstrual period 12/22/2011.  patient does not appear to be in any distress.  Abdomen is symmetrical. It is soft but with diffuse tenderness on mild as well as deep palpation. She is most tender and right upper quadrant. Organomegaly or masses noted.  Labs/studies Results: LFTs 11/25/2011. Bilirubin oh 0.3 AP 55 AST 73 ALT 39 albumin 4.1. 5 months ago AST was 17 and ALT 19. Abdominopelvic CT from 12/10/2011 failed to show biliary dilation or pancreatic abnormality.   Assessment:  Patient has chronic abdominal pain which appears to be multifactorial in one component of this pain is SOD dysfunction or biliary colic. Some of her pain would appear to be due to irritable bowel syndrome but I believe most of her pain is superficial in musculoskeletal. Therapeutic ERCP with only help her better we pain which seemed to have been most bothersome to her. One also has to wonder if she has abdominal migraine; however she has not responded to TCA.   Plan:  Will refer her to Dr. Gayland Curry of due to biliary service for better he manometry and sphincterotomy. All of patient's questions were answered.

## 2012-01-13 ENCOUNTER — Telehealth (INDEPENDENT_AMBULATORY_CARE_PROVIDER_SITE_OTHER): Payer: Self-pay | Admitting: *Deleted

## 2012-01-13 NOTE — Telephone Encounter (Signed)
Dr. Karilyn Cota - [facility] has called a few times wanting to know if you have spoken with Dr. Wyline Mood about her? She states that she has not heard from them as of yet. Dewayne Hatch has faxed all of her information. She may be reached at [phone].

## 2012-02-10 ENCOUNTER — Telehealth (INDEPENDENT_AMBULATORY_CARE_PROVIDER_SITE_OTHER): Payer: Self-pay | Admitting: *Deleted

## 2012-02-10 NOTE — Telephone Encounter (Signed)
Patient wanted you to know she will have her ERCP 02/11/12

## 2012-02-21 ENCOUNTER — Telehealth (INDEPENDENT_AMBULATORY_CARE_PROVIDER_SITE_OTHER): Payer: Self-pay | Admitting: *Deleted

## 2012-02-21 NOTE — Telephone Encounter (Signed)
Marie Hurley called to ask how soon her bowel movements (functions) would return to normal. Post ERCP she had diarrhea 1-3 days , now constipation which causes bloating and discomfort and on yesterday she had to take something. There was not made a post Op visit for her at [facility]. Per Dr. Karilyn Cota he has already spoken with Marie Hurley about the ERCP and advised her of what to do, and that is take 17 grams of Miralax every day. Office appointment in 3 months.

## 2012-02-22 NOTE — Telephone Encounter (Signed)
Apt has been scheduled for 05/23/12 at 2:00 pm with Dr. Karilyn Cota.

## 2012-02-23 ENCOUNTER — Other Ambulatory Visit (INDEPENDENT_AMBULATORY_CARE_PROVIDER_SITE_OTHER): Payer: Self-pay | Admitting: Internal Medicine

## 2012-02-23 ENCOUNTER — Telehealth (INDEPENDENT_AMBULATORY_CARE_PROVIDER_SITE_OTHER): Payer: Self-pay | Admitting: *Deleted

## 2012-02-23 NOTE — Telephone Encounter (Signed)
Already addressed

## 2012-02-23 NOTE — Telephone Encounter (Signed)
Please make an appointment in 3 months

## 2012-02-23 NOTE — Telephone Encounter (Signed)
Apt has been scheduled for 05/23/12 at 2:00 pm with Dr. Rehman. 

## 2012-02-23 NOTE — Telephone Encounter (Signed)
Needs Miralax refilled

## 2012-03-29 ENCOUNTER — Telehealth (INDEPENDENT_AMBULATORY_CARE_PROVIDER_SITE_OTHER): Payer: Self-pay | Admitting: *Deleted

## 2012-03-29 NOTE — Telephone Encounter (Signed)
Patient was called and made aware. 

## 2012-03-29 NOTE — Telephone Encounter (Signed)
This recommendation will not come from [place]. She has to apply for disability and they will ask for GI records which I would be glad to send. Please let patient know.

## 2012-03-29 NOTE — Telephone Encounter (Signed)
Patient wants to know if you would be willing to write her a letter that she can her lawyer stating that you feel she needs to be on disability due to her medical condition

## 2012-05-18 ENCOUNTER — Other Ambulatory Visit (INDEPENDENT_AMBULATORY_CARE_PROVIDER_SITE_OTHER): Payer: Self-pay | Admitting: Internal Medicine

## 2012-05-23 ENCOUNTER — Ambulatory Visit (INDEPENDENT_AMBULATORY_CARE_PROVIDER_SITE_OTHER): Payer: PRIVATE HEALTH INSURANCE | Admitting: Internal Medicine

## 2012-05-23 ENCOUNTER — Encounter (INDEPENDENT_AMBULATORY_CARE_PROVIDER_SITE_OTHER): Payer: Self-pay | Admitting: Internal Medicine

## 2012-05-23 VITALS — BP 110/70 | HR 74 | Temp 97.7°F | Resp 20 | Ht 63.0 in | Wt 124.5 lb

## 2012-05-23 DIAGNOSIS — R109 Unspecified abdominal pain: Secondary | ICD-10-CM

## 2012-05-23 NOTE — Progress Notes (Signed)
Presenting complaint;  Persistent abdominal pain.  Subjective:  Patient is 46 year old Caucasian female who is here for scheduled visit. I last saw on 01/03/2012 and I felt her right upper quadrant pain was secondary to injury colic since she was documented to have bumped in her transaminases. She was therefore referred to Dr. Duffy Rhody branch of [facility]. She had ERCP on 02/21/2012. There was no room for sphincterotomy to be enlarged, she therefore had sphincter dilated to 10 mm with a balloon(sphincteroplasty). She has noted decrease in intensity of her pain episodes and then also occurring less frequently. However she has had 2 bad episodes since her ERCP. She is staying on bland diet. Continues to complain of postprandial bloating and has pain primarily across her upper abdomen and some at lower abdomen. Bowels are moving as long as she stays on MiraLax. She states her abdominal pain triggers a migraine and migraine triggers or abdominal pain. She has an appointment to see another neurologist in [place]. She feels her depression is secondary to her symptoms and she is unable to work and therefore applying for disability. Her weight has been stable since her last visit. No melena or rectal bleeding reported. Heartburn is well controlled with therapy. She is not having any side effects with current dose of nortriptyline.  Current Medications: Current Outpatient Prescriptions  Medication Sig Dispense Refill  . b complex vitamins capsule Take 1 capsule by mouth daily.      . Calcium Carbonate Antacid (ALKA-SELTZER ANTACID PO) Take by mouth as needed.      . clonazePAM (KLONOPIN) 1 MG tablet Take 1 mg by mouth 3 (three) times daily as needed.        . fexofenadine (ALLEGRA) 180 MG tablet Take 180 mg by mouth daily.      . hydrocodone-acetaminophen (LORCET-HD) 5-500 MG per capsule Take 1 capsule by mouth every 6 (six) hours as needed.        . hydrocortisone (ANUSOL-HC) 2.5 % rectal cream  Place 1 application rectally 2 (two) times daily.      . hyoscyamine (LEVBID) 0.375 MG 12 hr tablet TAKE 1 TABLET TWICE DAILY BEFORE MEAL.  60 tablet  5  . Multiple Vitamin (DAILY VITAMIN PO) Take by mouth daily.      Marland Kitchen NEXIUM 40 MG capsule TAKE (1) CAPSULE TWICE DAILY.  60 each  5  . nortriptyline (PAMELOR) 25 MG capsule TAKE ONE CAPSULE TWICE DAILY  30 capsule  5  . polyethylene glycol powder (GLYCOLAX/MIRALAX) powder 17 GRAMS IN 8 OZ LIQUID TWICE DAILY AS NEEDED  527 g  5    Objective: Blood pressure 110/70, pulse 74, temperature 97.7 F (36.5 C), temperature source Oral, resp. rate 20, height 5\' 3"  (1.6 m), weight 124 lb 8 oz (56.473 kg), last menstrual period 05/23/2012. Patient is in no acute distress. She is quite anxious and in tears at times. Conjunctiva is pink. Sclera is nonicteric Oropharyngeal mucosa is normal. No neck masses or thyromegaly noted. Cardiac exam with regular rhythm normal S1 and S2. No murmur or gallop noted. Lungs are clear to auscultation. Abdomen is flat. Bowel sounds are normal. She has generalized tenderness on superficial and deep palpation but most pronounced at epigastric region. No organomegaly or masses noted.  No LE edema or clubbing noted.    Assessment:  Marie Hurley has chronic abdominal pain appears to be multifactorial. She is still having biliary type of pain these episodes are not as severe or frequent as they used to be prior  to going dilation of biliary sphincter 3 months ago at [facility]. Per Dr. Theodis Shove note there was no room for sphincterotomy to be extended. Some of her her pain appears to be wall pain and some secondary to IBS. I also wonder if she has abdominal migraine among other things. There appears to be significant interaction between her abdominal pain and migraine. One leads to the other and vice versa. I believe if her migraine could be controlled more effectively may have less abdominal pain. She is seen a neurologist in  [place] I hope active therapy for her migraine can be found.   Plan:  Continue nortriptyline at 25 mg by mouth daily seen by neurologist. LFTs if she has another episode of severe right upper quadrant or epigastric pain. Office visit in 4 months.

## 2012-05-23 NOTE — Patient Instructions (Signed)
LFTs if you have another episode of severe right upper quadrant pain. Please do blood work within 4-6 hours after onset of this pain.

## 2012-05-25 ENCOUNTER — Telehealth (INDEPENDENT_AMBULATORY_CARE_PROVIDER_SITE_OTHER): Payer: Self-pay | Admitting: *Deleted

## 2012-05-25 NOTE — Telephone Encounter (Signed)
Patient wants to go ahead and be referred to Dr. Lovell Sheehan for her hemorrhoids.  Any day anytime.  She said that Dr. Karilyn Cota had told her once she had her procedure at tertiary center then they would worry about the hemorrhoids.  She wants to proceed.

## 2012-05-28 ENCOUNTER — Encounter (INDEPENDENT_AMBULATORY_CARE_PROVIDER_SITE_OTHER): Payer: Self-pay | Admitting: Internal Medicine

## 2012-05-31 NOTE — Telephone Encounter (Signed)
Dr.Rehman may we go ahead and make the surgical consult?

## 2012-05-31 NOTE — Telephone Encounter (Signed)
Can refer to Dr. Lovell Sheehan

## 2012-06-01 NOTE — Telephone Encounter (Signed)
appt w/ Dr Leretha Pol 06/08/12 @ 1045 (1030), patient aware

## 2012-07-06 NOTE — H&P (Signed)
  NTS SOAP Note  Vital Signs:  Vitals as of: 07/06/2012: Systolic 149: Diastolic 88: Heart Rate 85: Temp 99.42F: Height 4ft 3in: Weight 132Lbs 0 Ounces: BMI 23  BMI : 23.38 kg/m2  Subjective: This 14 Years 68 Months old Female presents for of    HEMORRHOIDS: ,Has had intermittent problems with hemorrhoids for many years since her 9's.  Made worse with childbirth.  Occassionally are sore and swollen.  Does have bowel constipation and takes miralax daily.Occassional blood on toilet paper when she wipes herself.  Has had TCS by Dr. Karilyn Cota in the past.  Review of Symptoms:  Constitutional:  fatigue    headaches Eyes:unremarkable   sinus problems Cardiovascular:  unremarkable   Respiratory:unremarkable   Gastrointestin    abdominal pain,nausea,vomiting,constipation,heartburn Genitourinary:unremarkable       joint and neck pain   hay fever   Past Medical History:    Reviewed   Past Medical History  Surgical History: multiple ERCP's, left foot, lap cholecystectomy, right carpal tunnel release BTL Medical Problems: migraines, GERD, IBS Psychiatric History:  Depression Allergies: pcn,phenergan Medications: hyoscyamine, nortrypthyline, nexium, miralax, flexeril, lortab, clonazepam, inderal la 60mg    Social History:Reviewed  Social History  Preferred Language: English ([place]) Race:  White Ethnicity: Hispanic / Latino Age: 46 Years 6 Months Marital Status:  M Alcohol: socially Recreational drug(s):  No   Smoking Status: Never smoker reviewed on 07/06/2012  Family History:  Reviewed   Family History  Is there a family history of:CAD, arthritis    Objective Information: General:  Well appearing, well nourished in no distress. Head:Atraumatic; no masses; no abnormalities Neck:  Supple without lymphadenopathy.  Heart:  RRR, no murmur or gallop.  Normal S1, S2.  No S3, S4.  Lungs:    CTA bilaterally, no wheezes,  rhonchi, rales.  Breathing unlabored. Abdomen:Soft, NT/ND, no HSM, no masses.   Multiple external hemorrhoidal tissue noted.  Sphinter tone normal.  ?internal hemorrhoids also present.  Assessment:Hemorrhoidal disease  Diagnosis &amp; Procedure: DiagnosisCode: 455.6, ProcedureCode: 45409,    Plan:Scheduled for extensive hemorroidectomy on 07/14/12.  She knows that they could recur and she may need further surgery.   Patient Education:Alternative treatments to surgery were discussed with patient (and family).  Risks and benefits  of procedure were fully explained to the patient (and family) who gave informed consent. Patient/family questions were addressed.  Follow-up:Pending Surgery

## 2012-07-07 ENCOUNTER — Encounter (HOSPITAL_COMMUNITY): Payer: Self-pay | Admitting: Pharmacy Technician

## 2012-07-10 ENCOUNTER — Inpatient Hospital Stay (HOSPITAL_COMMUNITY): Admission: RE | Admit: 2012-07-10 | Discharge: 2012-07-10 | Payer: PRIVATE HEALTH INSURANCE | Source: Ambulatory Visit

## 2012-07-10 NOTE — Patient Instructions (Addendum)
20 Marie Hurley  07/10/2012   Your procedure is scheduled on:   07/14/2012  Report to [facility] at  1040  AM.  Call this number if you have problems the morning of surgery: [phone]   Remember:   Do not eat food:After Midnight.  May have clear liquids:until Midnight .    Take these medicines the morning of surgery with A SIP OF WATER: nexium,nortriptyline,klonopin,allegra,lorcet,levbid   Do not wear jewelry, make-up or nail polish.  Do not wear lotions, powders, or perfumes. You may wear deodorant.  Do not shave 48 hours prior to surgery. Men may shave face and neck.  Do not bring valuables to the hospital.  Contacts, dentures or bridgework may not be worn into surgery.  Leave suitcase in the car. After surgery it may be brought to your room.  For patients admitted to the hospital, checkout time is 11:00 AM the day of discharge.   Patients discharged the day of surgery will not be allowed to drive home.  Name and phone number of your driver: family  Special Instructions: Shower using CHG 2 nights before surgery and the night before surgery.  If you shower the day of surgery use CHG.  Use special wash - you have one bottle of CHG for all showers.  You should use approximately 1/3 of the bottle for each shower.   Please read over the following fact sheets that you were given: Pain Booklet, MRSA Information, Surgical Site Infection Prevention, Anesthesia Post-op Instructions and Care and Recovery After Surgery Hemorrhoidectomy Hemorrhoidectomy is surgery to remove hemorrhoids. Hemorrhoids are veins that have become swollen in the rectum. The rectum is the area from the bottom end of the intestines to the opening where bowel movements leave the body. Hemorrhoids can be uncomfortable. They can cause itching, bleeding and pain if a blood clot forms in them (thrombose). If hemorrhoids are small, surgery may not be needed. But if they cover a larger area, surgery is usually suggested.    LET YOUR CAREGIVER KNOW ABOUT:   Any allergies.   All medications you are taking, including:   Herbs, eyedrops, over-the-counter medications and creams.   Blood thinners (anticoagulants), aspirin or other drugs that could affect blood clotting.   Use of steroids (by mouth or as creams).   Previous problems with anesthetics, including local anesthetics.   Possibility of pregnancy, if this applies.   Any history of blood clots.   Any history of bleeding or other blood problems.   Previous surgery.   Smoking history.   Other health problems.  RISKS AND COMPLICATIONS All surgery carries some risk. However, hemorrhoid surgery usually goes smoothly. Possible complications could include:  Urinary retention.   Bleeding.   Infection.   A painful incision.   A reaction to the anesthesia (this is not common).  BEFORE THE PROCEDURE   Stop using aspirin and non-steroidal anti-inflammatory drugs (NSAIDs) for pain relief. This includes prescription drugs and over-the-counter drugs such as ibuprofen and naproxen. Also stop taking vitamin E. If possible, do this two weeks before your surgery.   If you take blood-thinners, ask your healthcare provider when you should stop taking them.   You will probably have blood and urine tests done several days before your surgery.   Do not eat or drink for about 8 hours before the surgery.   Arrive at least an hour before the surgery, or whenever your surgeon recommends. This will give you time to check in and  fill out any needed paperwork.   Hemorrhoidectomy is often an outpatient procedure. This means you will be able to go home the same day. Sometimes, though, people stay overnight in the hospital after the procedure. Ask your surgeon what to expect. Either way, make arrangements in advance for someone to drive you home.  PROCEDURE   The preparation:   You will change into a hospital gown.   You will be given an IV. A needle will be  inserted in your arm. Medication can flow directly into your body through this needle.   You might be given an enema to clear your rectum.   Once in the operating room, you will probably lie on your side or be repositioned later to lying on your stomach.   You will be given anesthesia (medication) so you will not feel anything during the surgery. The surgery often is done with local anesthesia (the area near the hemorrhoids will be numb and you will be drowsy but awake). Sometimes, general anesthesia is used (you will be asleep during the procedure).   The procedure:   There are a few different procedures for hemorrhoids. Be sure to ask you surgeon about the procedure, the risks and benefits.   Be sure to ask about what you need to do to take care of the wound, if there is one.  AFTER THE PROCEDURE  You will stay in a recovery area until the anesthesia has worn off. Your blood pressure and pulse will be checked every so often.   You may feel a lot of pain in the area of the rectum.   Take all pain medication prescribed by your surgeon. Ask before taking any over-the-counter pain medicines.   Sometimes sitting in a warm bath can help relieve your pain.   To make sure you have bowel movements without straining:   You will probably need to take stool softeners (usually a pill) for a few days.   You should drink 8 to 10 glasses of water each day.   Your activity will be restricted for awhile. Ask your caregiver for a list of what you should and should not do while you recover.  Document Released: 07/25/2009 Document Revised: 09/16/2011 Document Reviewed: 07/25/2009 [facility] Patient Information 2012 [place], [place].PATIENT INSTRUCTIONS POST-ANESTHESIA  IMMEDIATELY FOLLOWING SURGERY:  Do not drive or operate machinery for the first twenty four hours after surgery.  Do not make any important decisions for twenty four hours after surgery or while taking narcotic pain medications or  sedatives.  If you develop intractable nausea and vomiting or a severe headache please notify your doctor immediately.  FOLLOW-UP:  Please make an appointment with your surgeon as instructed. You do not need to follow up with anesthesia unless specifically instructed to do so.  WOUND CARE INSTRUCTIONS (if applicable):  Keep a dry clean dressing on the anesthesia/puncture wound site if there is drainage.  Once the wound has quit draining you may leave it open to air.  Generally you should leave the bandage intact for twenty four hours unless there is drainage.  If the epidural site drains for more than 36-48 hours please call the anesthesia department.  QUESTIONS?:  Please feel free to call your physician or the hospital operator if you have any questions, and they will be happy to assist you.

## 2012-07-14 ENCOUNTER — Ambulatory Visit: Admit: 2012-07-14 | Payer: PRIVATE HEALTH INSURANCE | Admitting: General Surgery

## 2012-07-14 SURGERY — HEMORRHOIDECTOMY
Anesthesia: General

## 2012-08-23 ENCOUNTER — Other Ambulatory Visit (INDEPENDENT_AMBULATORY_CARE_PROVIDER_SITE_OTHER): Payer: Self-pay | Admitting: Internal Medicine

## 2012-08-28 ENCOUNTER — Other Ambulatory Visit (HOSPITAL_COMMUNITY): Payer: PRIVATE HEALTH INSURANCE

## 2012-08-28 ENCOUNTER — Encounter (INDEPENDENT_AMBULATORY_CARE_PROVIDER_SITE_OTHER): Payer: Self-pay

## 2012-08-28 NOTE — Telephone Encounter (Signed)
Send to primary care 

## 2012-08-28 NOTE — Telephone Encounter (Signed)
This should go to primary care. This is not a GI medication

## 2012-09-04 ENCOUNTER — Ambulatory Visit: Admit: 2012-09-04 | Payer: Self-pay | Admitting: General Surgery

## 2012-09-04 SURGERY — HEMORRHOIDECTOMY
Anesthesia: General

## 2012-09-25 ENCOUNTER — Encounter (INDEPENDENT_AMBULATORY_CARE_PROVIDER_SITE_OTHER): Payer: Self-pay | Admitting: Internal Medicine

## 2012-09-25 ENCOUNTER — Ambulatory Visit (INDEPENDENT_AMBULATORY_CARE_PROVIDER_SITE_OTHER): Payer: PRIVATE HEALTH INSURANCE | Admitting: Internal Medicine

## 2012-09-25 VITALS — BP 112/70 | HR 72 | Temp 98.4°F | Resp 18 | Ht 63.0 in | Wt 129.9 lb

## 2012-09-25 DIAGNOSIS — K219 Gastro-esophageal reflux disease without esophagitis: Secondary | ICD-10-CM

## 2012-09-25 DIAGNOSIS — R1011 Right upper quadrant pain: Secondary | ICD-10-CM

## 2012-09-25 DIAGNOSIS — R1013 Epigastric pain: Secondary | ICD-10-CM

## 2012-09-25 NOTE — Progress Notes (Signed)
Presenting complaint;  Followup for epigastric and right upper quadrant abdominal pain.  Subjective:  Patient is 46 year old Caucasian female who is here for scheduled visit. She was last seen 4 months ago and begun on nortriptyline. Her dose was doubled from 25 mg daily to 25 mg twice a day by her neurologist. She feels little bit better than on her last visit. She has had few episodes of significant right upper quadrant/epigastric pain but she was not able to go to the lab for blood work . She has some pain virtually every day primarily in epigastric region. Facet pain does not occur daily she has at least 2-3 episodes per week. She believes nortriptyline has helped this pain. She also believes that balloon dilation of biliary sphincter that she underwent in May 2013 at [facility] may have helped. Her heartburn is well controlled with therapy. Only time she has heartburn is with certain foods. She is using MiraLax at least 4-5 times each week. On an average she takes 30 pain pills in a month both for migraine and abdominal pain. She is also taking ibuprofen and alternate Naprosyn primarily for her migraine no more than 10 pills of each per month. She has one or 2 episodes of hematochezia per month. She has hemorrhoids.  Current Medications: Current Outpatient Prescriptions  Medication Sig Dispense Refill  . b complex vitamins capsule Take 1 capsule by mouth daily.      . Calcium Carbonate Antacid (ALKA-SELTZER ANTACID PO) Take 2 tablets by mouth 2 (two) times daily as needed. Upset stomach.      . clonazePAM (KLONOPIN) 1 MG tablet Take 1 mg by mouth 3 (three) times daily as needed. Anxiety/depression.      . cyclobenzaprine (FLEXERIL) 10 MG tablet Take 10 mg by mouth 3 (three) times daily as needed. Migraines.      . dimenhyDRINATE (DRAMAMINE) 50 MG tablet Take 50 mg by mouth every 8 (eight) hours as needed. Upset stomach.      . fexofenadine (ALLEGRA) 180 MG tablet Take 180 mg by mouth daily.      .  hydrocodone-acetaminophen (LORCET-HD) 5-500 MG per capsule Take 1 capsule by mouth every 6 (six) hours as needed. Migraines/stomach pain.      . hydrocortisone (ANUSOL-HC) 2.5 % rectal cream Place 1 application rectally 2 (two) times daily.      Marland Kitchen ibuprofen (ADVIL,MOTRIN) 200 MG tablet Take 400 mg by mouth 2 (two) times daily as needed. Headaches/pain.      . Multiple Vitamin (DAILY VITAMIN PO) Take 1 tablet by mouth daily.       . naproxen sodium (ALEVE) 220 MG tablet Take 440 mg by mouth 2 (two) times daily as needed. Headaches/pain.      Marland Kitchen NEXIUM 40 MG capsule TAKE ONE CAPSULE TWICE DAILY  60 capsule  5  . nortriptyline (PAMELOR) 25 MG capsule TAKE ONE CAPSULE TWICE DAILY  30 capsule  5  . polyethylene glycol powder (GLYCOLAX/MIRALAX) powder 17 g daily. Patient states that she may take 1-2 doses a day,depending on her bowel movements      . propranolol ER (INDERAL LA) 60 MG 24 hr capsule Take 60 mg by mouth daily.      Marland Kitchen SALINE NASAL SPRAY NA Place 1 spray into the nose 4 (four) times daily as needed. Allergies.         Objective: Blood pressure 112/70, pulse 72, temperature 98.4 F (36.9 C), temperature source Oral, resp. rate 18, height 5\' 3"  (1.6 m), weight  129 lb 14.4 oz (58.922 kg), last menstrual period 09/21/2012. Patient is alert and in no acute distress. Conjunctiva is pink. Sclera is nonicteric Oropharyngeal mucosa is normal. No neck masses or thyromegaly noted. Cardiac exam with regular rhythm normal S1 and S2. No murmur or gallop noted. Lungs are clear to auscultation. Abdomen. Bowel sounds are normal. Abdomen is soft with mild to moderate midepigastric tenderness mild tenderness below the right costal margin and right midabdomen. No organomegaly or masses.  No LE edema or clubbing noted.   Assessment:  #1. Chronic abdominal pain which is primarily centered in right upper quadrant and epigastric region. Her pain is multi-factorial. Some of her pain appears to be abdominal  wall pain. She also appears to have biliary type of pain and epigastric pain may be part and parcel of her very pain or secondary to chronic dyspepsia. She may be slightly better since she's been on nortriptyline. #2. Chronic GERD. She is doing well with therapy. #3. Chronic constipation. He has history of IBS but her constipation is predominantly secondary to medications.   Plan:  She will continue current therapy. She will go to the lab for LFTs if she has another episode of significant right upper quadrant pain. Office visit in one year.

## 2012-09-25 NOTE — Patient Instructions (Signed)
Will check LFTs if you have another episode of severe RUQ abdominal pain.

## 2012-09-26 ENCOUNTER — Telehealth (INDEPENDENT_AMBULATORY_CARE_PROVIDER_SITE_OTHER): Payer: Self-pay | Admitting: *Deleted

## 2012-09-26 NOTE — Telephone Encounter (Signed)
Patient was seen in the office on 09/25/12 at this time she states that her PCP had placed her on an anti depressant. She called today with the name of it Citalopram 20 mg she takes 1 by mouth at bedtime This has been added to her medication list

## 2012-09-28 ENCOUNTER — Telehealth (INDEPENDENT_AMBULATORY_CARE_PROVIDER_SITE_OTHER): Payer: Self-pay | Admitting: *Deleted

## 2012-09-28 NOTE — Telephone Encounter (Signed)
We rec'd a fax request for refill on Nortriptyline 25 mg Per Dr.Rehman may call in the following  Nortriptyline 25 mg take 1 by mouth twice a day # 60 with 2 refills This was called to [facility] Also advised that the patient would have to call for further refills Patient called and made aware

## 2012-10-10 ENCOUNTER — Other Ambulatory Visit (INDEPENDENT_AMBULATORY_CARE_PROVIDER_SITE_OTHER): Payer: Self-pay | Admitting: Internal Medicine

## 2012-12-02 ENCOUNTER — Other Ambulatory Visit (INDEPENDENT_AMBULATORY_CARE_PROVIDER_SITE_OTHER): Payer: Self-pay | Admitting: Internal Medicine

## 2012-12-19 ENCOUNTER — Telehealth (INDEPENDENT_AMBULATORY_CARE_PROVIDER_SITE_OTHER): Payer: Self-pay | Admitting: *Deleted

## 2012-12-19 DIAGNOSIS — G8929 Other chronic pain: Secondary | ICD-10-CM

## 2012-12-19 NOTE — Telephone Encounter (Signed)
Marie Hurley stating she is have stomach spasms again. It is like when she had a blockage before and would like to see if Dr. Karilyn Cota would order lab work to check her LFT's? Her return phone number is [phone].

## 2012-12-19 NOTE — Telephone Encounter (Signed)
Per Dr.Rehman may order LFT

## 2012-12-19 NOTE — Telephone Encounter (Signed)
Per Dr.Rehman the patient may have LFT. Patient was called and made aware. She states that the pain stopped during the night. Since her last visit her mother died , and her son is trying to get joint custody of his son She has had some constipation , did not have bowel movement until Thursday morning , then again this morning, they were really good , she went about 4 times. This is about the time the pain stopped.  Marie Hurley says that she had spasms on yesterday, questions if there may still be a blockage?  She ask if this stress could cause IBS, cause this horrible pain?

## 2012-12-19 NOTE — Telephone Encounter (Signed)
Patient was called and advised that she should get the lab results.  And, yes stress can cause IBD to flare.

## 2012-12-21 ENCOUNTER — Telehealth (INDEPENDENT_AMBULATORY_CARE_PROVIDER_SITE_OTHER): Payer: Self-pay | Admitting: *Deleted

## 2012-12-21 NOTE — Telephone Encounter (Signed)
Marie Hurley would like for [name] to return her call at [phone]. She is wanting to let her know why she didn't get her lab work done.

## 2012-12-21 NOTE — Telephone Encounter (Signed)
I returned Marie Hurley's call. She states that she did not go and get the lab work done the other night because as she had explained that the pain had stopped after she started having BM's. Secondly, her husband is the only one working right down and there are many bills [employer]) coming in. She felt that the labs would have been normal ending with another bill. She stresses that Dr.Rehman had told her that she would need to get the lab work within 4-6 hours of the onset of pain. She is very concerned that she may have another blockage, and says that it has not been a year yet , will be in May. The Doctors have told her that this could happen again (Blockage)  Question is ? Is this a blockage or IBS due to the amount of stress that she is under (refer to previous note) Questions if Dr.Rehman would want to see her?

## 2012-12-27 NOTE — Telephone Encounter (Signed)
Dr. Rehman is aware. 

## 2013-01-10 ENCOUNTER — Other Ambulatory Visit (INDEPENDENT_AMBULATORY_CARE_PROVIDER_SITE_OTHER): Payer: Self-pay | Admitting: Internal Medicine

## 2013-01-11 ENCOUNTER — Telehealth (INDEPENDENT_AMBULATORY_CARE_PROVIDER_SITE_OTHER): Payer: Self-pay | Admitting: *Deleted

## 2013-01-11 NOTE — Telephone Encounter (Signed)
LM stating she needed her Nortriptyline refilled. Marie Hurley would like to see if Marie Hurley would write the rx for 90 pills instead of 60. Her return phone number is [phone].

## 2013-01-11 NOTE — Telephone Encounter (Signed)
Dr.Rehman would you write this for [place] and she ask that it be filled for 90 verses 60. FYI : Her father died on 2913/01/02 , and her nephew's grandfather , Marin Comment passed away on 22-Jan-2023 (house fire)

## 2013-01-14 NOTE — Telephone Encounter (Signed)
You can fill script for 30 or 90 days.

## 2013-01-15 ENCOUNTER — Encounter (INDEPENDENT_AMBULATORY_CARE_PROVIDER_SITE_OTHER): Payer: Self-pay | Admitting: *Deleted

## 2013-01-15 NOTE — Telephone Encounter (Signed)
Prescription for Nortriptyline 25 mg take 1 by mouth twice a day #90 with 2 refills called to [facility]. Patient called and made aware.

## 2013-04-16 ENCOUNTER — Other Ambulatory Visit (INDEPENDENT_AMBULATORY_CARE_PROVIDER_SITE_OTHER): Payer: Self-pay | Admitting: Internal Medicine

## 2013-04-18 ENCOUNTER — Telehealth (INDEPENDENT_AMBULATORY_CARE_PROVIDER_SITE_OTHER): Payer: Self-pay | Admitting: *Deleted

## 2013-04-18 NOTE — Telephone Encounter (Signed)
Marie Hurley is having stomach spasm and constipation x5 days. PCP gave her Linzess that didn't help so she took 1/2 bottle of Magnesium Citrate with Murelax. Pia Mau have moved very little. Now is sick to stomach, nauseated, burping with vomiting that started this am. The return phone number is [phone].

## 2013-04-18 NOTE — Telephone Encounter (Signed)
Per Dr.Rehman the patient should get 2 enema's and use . Patient called and made aware.

## 2013-06-15 ENCOUNTER — Other Ambulatory Visit (INDEPENDENT_AMBULATORY_CARE_PROVIDER_SITE_OTHER): Payer: Self-pay | Admitting: Internal Medicine

## 2013-08-03 ENCOUNTER — Telehealth (INDEPENDENT_AMBULATORY_CARE_PROVIDER_SITE_OTHER): Payer: Self-pay | Admitting: *Deleted

## 2013-08-03 ENCOUNTER — Telehealth: Payer: Self-pay | Admitting: Internal Medicine

## 2013-08-03 DIAGNOSIS — R109 Unspecified abdominal pain: Secondary | ICD-10-CM

## 2013-08-03 DIAGNOSIS — R112 Nausea with vomiting, unspecified: Secondary | ICD-10-CM

## 2013-08-03 LAB — HEPATIC FUNCTION PANEL
Bilirubin, Direct: 0.1 mg/dL (ref 0.0–0.3)
Total Bilirubin: 0.5 mg/dL (ref 0.3–1.2)

## 2013-08-03 NOTE — Telephone Encounter (Signed)
Patient called states that she would like to know about having blood work, she is having another spell with stomach, N/V and abdominal Pain. Ann called the patient to let her know that we got the message,she advised Dewayne Hatch that she was on her way here until I reached Dr.Rehman and would set here in our office. Dr.Rehman was paged.

## 2013-08-03 NOTE — Telephone Encounter (Signed)
Per Dr.Rehman'e previous OV note , if patient had this pain , she was to have LFT's drawn,. I also reviewed this with Birdena Jubilee who states that she is in agreement. Order done and given to patient.

## 2013-08-03 NOTE — Telephone Encounter (Signed)
Robin with [facility] labs called me after hours this evening to inform me pts liver profile was completely normal

## 2013-08-05 NOTE — Telephone Encounter (Signed)
I went over the results of LFT with patient.

## 2013-08-09 ENCOUNTER — Other Ambulatory Visit (INDEPENDENT_AMBULATORY_CARE_PROVIDER_SITE_OTHER): Payer: Self-pay | Admitting: Internal Medicine

## 2013-08-09 NOTE — Telephone Encounter (Signed)
Patient was called and made aware (message left) that the prescription was called into her pharmacy and that per Dr.Rehman she could take more than 20 mg of Citalopram while on th Nexium.

## 2013-08-09 NOTE — Telephone Encounter (Signed)
Please call patient and let her know that she should not take more than 20 mg of citalopram while on Nexium.

## 2013-08-27 ENCOUNTER — Other Ambulatory Visit (HOSPITAL_COMMUNITY): Payer: Self-pay | Admitting: Internal Medicine

## 2013-08-27 DIAGNOSIS — Z139 Encounter for screening, unspecified: Secondary | ICD-10-CM

## 2013-08-31 ENCOUNTER — Ambulatory Visit (HOSPITAL_COMMUNITY): Payer: PRIVATE HEALTH INSURANCE

## 2013-09-18 ENCOUNTER — Telehealth (INDEPENDENT_AMBULATORY_CARE_PROVIDER_SITE_OTHER): Payer: Self-pay | Admitting: *Deleted

## 2013-09-18 DIAGNOSIS — R109 Unspecified abdominal pain: Secondary | ICD-10-CM

## 2013-09-18 DIAGNOSIS — R112 Nausea with vomiting, unspecified: Secondary | ICD-10-CM

## 2013-09-18 LAB — HEPATIC FUNCTION PANEL
AST: 23 U/L (ref 0–37)
Alkaline Phosphatase: 48 U/L (ref 39–117)
Bilirubin, Direct: 0.1 mg/dL (ref 0.0–0.3)
Indirect Bilirubin: 0.2 mg/dL (ref 0.0–0.9)
Total Bilirubin: 0.3 mg/dL (ref 0.3–1.2)

## 2013-09-18 NOTE — Telephone Encounter (Signed)
Marie Hurley is having another episode with her stomach. This is the 2nd one within a week. She started vomiting violently this morning with pain in the middle of her stomach. The return phone number is [phone].

## 2013-09-18 NOTE — Telephone Encounter (Signed)
Per Dr.Rehman LFt's . Order given.

## 2013-10-11 DIAGNOSIS — C44519 Basal cell carcinoma of skin of other part of trunk: Secondary | ICD-10-CM

## 2013-10-11 HISTORY — DX: Basal cell carcinoma of skin of other part of trunk: C44.519

## 2013-10-12 ENCOUNTER — Telehealth (INDEPENDENT_AMBULATORY_CARE_PROVIDER_SITE_OTHER): Payer: Self-pay | Admitting: *Deleted

## 2013-10-12 NOTE — Telephone Encounter (Signed)
Marie Hurley is needing a new Rx for Levbid 3 times daily. She is running out early scence she was told she could use it more than twice daily if needed. Will not have any more after Monday, 10/15/13. Would also like to make sure a standing order in on file with the lab to have her blood work when needed. Her return number is [phone].

## 2013-10-12 NOTE — Telephone Encounter (Signed)
Marie Hurley would like to see if Dr. Laural Golden would hand write her a Rx for lab work incase she can not make it to [place]. He had given her one before.

## 2013-10-15 ENCOUNTER — Other Ambulatory Visit (INDEPENDENT_AMBULATORY_CARE_PROVIDER_SITE_OTHER): Payer: Self-pay | Admitting: *Deleted

## 2013-10-15 MED ORDER — HYOSCYAMINE SULFATE ER 0.375 MG PO TB12: ORAL_TABLET | ORAL | Status: DC

## 2013-10-15 NOTE — Telephone Encounter (Signed)
I have sent these request to [facility] Patient will be contacted when ready.

## 2013-10-15 NOTE — Telephone Encounter (Signed)
Patient is requesting to take this three times daily , prn. Also she would like a written prescription for her to have lab work if needed when she experiences pain.

## 2013-10-23 ENCOUNTER — Other Ambulatory Visit (HOSPITAL_COMMUNITY): Payer: Self-pay | Admitting: Orthopedic Surgery

## 2013-10-23 DIAGNOSIS — M542 Cervicalgia: Secondary | ICD-10-CM

## 2013-10-25 ENCOUNTER — Other Ambulatory Visit (INDEPENDENT_AMBULATORY_CARE_PROVIDER_SITE_OTHER): Payer: Self-pay | Admitting: Internal Medicine

## 2013-10-25 ENCOUNTER — Ambulatory Visit (HOSPITAL_COMMUNITY): Payer: 59

## 2013-10-25 MED ORDER — B COMPLEX VITAMINS PO CAPS: 1.0000 | ORAL_CAPSULE | Freq: Three times a day (TID) | ORAL | Status: DC

## 2013-10-25 MED ORDER — HYOSCYAMINE SULFATE ER 0.375 MG PO TB12: ORAL_TABLET | ORAL | Status: DC

## 2013-10-25 NOTE — Telephone Encounter (Signed)
Prescription for Levbid sent to her pharmacy. She can pick up paperwork for LFTs Monday or Terri can write one today.

## 2013-10-25 NOTE — Telephone Encounter (Signed)
Patient was called and made aware. The order to have on hand for lab work when needed is being mailed.

## 2013-10-26 ENCOUNTER — Ambulatory Visit (HOSPITAL_COMMUNITY)
Admission: RE | Admit: 2013-10-26 | Discharge: 2013-10-26 | Disposition: A | Discharge status: Home / Self Care | Payer: 59 | Source: Ambulatory Visit | Attending: Internal Medicine | Admitting: Internal Medicine

## 2013-10-26 ENCOUNTER — Ambulatory Visit (HOSPITAL_COMMUNITY): Admission: RE | Admit: 2013-10-26 | Payer: 59 | Source: Ambulatory Visit

## 2013-10-26 DIAGNOSIS — Z139 Encounter for screening, unspecified: Secondary | ICD-10-CM

## 2013-10-26 DIAGNOSIS — Z1231 Encounter for screening mammogram for malignant neoplasm of breast: Secondary | ICD-10-CM | POA: Insufficient documentation

## 2013-10-29 ENCOUNTER — Telehealth (INDEPENDENT_AMBULATORY_CARE_PROVIDER_SITE_OTHER): Payer: Self-pay | Admitting: *Deleted

## 2013-10-29 NOTE — Telephone Encounter (Signed)
Needs to speak with [name] about the medications called in at [facility] for her. One was B complex and she is already taking that. The return phone number is [phone].

## 2013-10-30 ENCOUNTER — Ambulatory Visit (HOSPITAL_COMMUNITY): Payer: 59

## 2013-10-30 ENCOUNTER — Ambulatory Visit (HOSPITAL_COMMUNITY)
Admission: RE | Admit: 2013-10-30 | Discharge: 2013-10-30 | Disposition: A | Discharge status: Home / Self Care | Payer: 59 | Source: Ambulatory Visit | Attending: Orthopedic Surgery | Admitting: Orthopedic Surgery

## 2013-10-30 DIAGNOSIS — M79609 Pain in unspecified limb: Secondary | ICD-10-CM | POA: Insufficient documentation

## 2013-10-30 DIAGNOSIS — M542 Cervicalgia: Secondary | ICD-10-CM | POA: Insufficient documentation

## 2013-10-30 DIAGNOSIS — E041 Nontoxic single thyroid nodule: Secondary | ICD-10-CM | POA: Insufficient documentation

## 2013-10-30 DIAGNOSIS — M47812 Spondylosis without myelopathy or radiculopathy, cervical region: Secondary | ICD-10-CM | POA: Insufficient documentation

## 2013-10-30 NOTE — Telephone Encounter (Signed)
I have called [facility] and changed the sig to three times daily per Dr.Rehman's instruction. The vitamin B complex , we are not sure how this was escribed to [facility]. Patient will be made aware.

## 2013-10-30 NOTE — Telephone Encounter (Signed)
I have talked with Marie Hurley. She states that she had been told by Dr.Rehman that she soul take the Levbid three times a day prn for her spasms. As some days are worse than others.She is asking that we change the prescription to three a day instead of twice a day. Also the pharmacy delivered vitamin B complex to her, she is asking why , as she has been taking this one. This will be addressed with Dr.Rehman then the patient contacted.

## 2013-11-02 ENCOUNTER — Other Ambulatory Visit (HOSPITAL_COMMUNITY): Payer: Self-pay | Admitting: Orthopedic Surgery

## 2013-11-02 DIAGNOSIS — R52 Pain, unspecified: Secondary | ICD-10-CM

## 2013-11-07 ENCOUNTER — Ambulatory Visit (HOSPITAL_COMMUNITY)
Admission: RE | Admit: 2013-11-07 | Discharge: 2013-11-07 | Disposition: A | Discharge status: Home / Self Care | Payer: 59 | Source: Ambulatory Visit | Attending: Orthopedic Surgery | Admitting: Orthopedic Surgery

## 2013-11-09 ENCOUNTER — Other Ambulatory Visit (INDEPENDENT_AMBULATORY_CARE_PROVIDER_SITE_OTHER): Payer: Self-pay | Admitting: Internal Medicine

## 2013-12-03 ENCOUNTER — Ambulatory Visit (INDEPENDENT_AMBULATORY_CARE_PROVIDER_SITE_OTHER): Payer: 59 | Admitting: Internal Medicine

## 2013-12-03 ENCOUNTER — Telehealth (INDEPENDENT_AMBULATORY_CARE_PROVIDER_SITE_OTHER): Payer: Self-pay | Admitting: *Deleted

## 2013-12-03 NOTE — Telephone Encounter (Signed)
Needs a new Rx for polyethylene glycol powder. She would like to get 2 tablets twice daily. [facility]'s return phone number is [phone].

## 2013-12-03 NOTE — Telephone Encounter (Signed)
I would like for you to look at

## 2013-12-04 ENCOUNTER — Other Ambulatory Visit (INDEPENDENT_AMBULATORY_CARE_PROVIDER_SITE_OTHER): Payer: Self-pay | Admitting: Internal Medicine

## 2013-12-04 MED ORDER — POLYETHYLENE GLYCOL 3350 17 GM/SCOOP PO POWD: 1.0000 | Freq: Two times a day (BID) | ORAL | Status: DC

## 2013-12-04 NOTE — Telephone Encounter (Signed)
Patient was called to let her know the Rx has been sent in and voices understood.

## 2013-12-04 NOTE — Telephone Encounter (Signed)
Prescription sent to patient's pharmacy; Please let her know

## 2013-12-05 ENCOUNTER — Telehealth (INDEPENDENT_AMBULATORY_CARE_PROVIDER_SITE_OTHER): Payer: Self-pay | Admitting: *Deleted

## 2013-12-05 NOTE — Telephone Encounter (Signed)
Rec'd a phone call from [place] at [facility]. They needed clarification on the order they rec'd. Per Dr.Rehman the patient is to take 2 capfuls by mouth twice daily. This was given verbal to [place].

## 2013-12-11 ENCOUNTER — Ambulatory Visit (INDEPENDENT_AMBULATORY_CARE_PROVIDER_SITE_OTHER): Payer: 59 | Admitting: Internal Medicine

## 2013-12-11 ENCOUNTER — Encounter (INDEPENDENT_AMBULATORY_CARE_PROVIDER_SITE_OTHER): Payer: Self-pay | Admitting: Internal Medicine

## 2013-12-11 VITALS — BP 114/70 | HR 72 | Temp 98.7°F | Resp 18 | Ht 63.0 in | Wt 130.4 lb

## 2013-12-11 DIAGNOSIS — K219 Gastro-esophageal reflux disease without esophagitis: Secondary | ICD-10-CM

## 2013-12-11 DIAGNOSIS — K589 Irritable bowel syndrome without diarrhea: Secondary | ICD-10-CM

## 2013-12-11 DIAGNOSIS — G8929 Other chronic pain: Secondary | ICD-10-CM

## 2013-12-11 DIAGNOSIS — R1011 Right upper quadrant pain: Secondary | ICD-10-CM

## 2013-12-11 NOTE — Patient Instructions (Signed)
Colonoscopy be scheduled in April 2015.

## 2013-12-11 NOTE — Progress Notes (Signed)
Presenting complaint;  Followup for abdominal pain, GERD and constipation. Patient complains of rectal bleeding.  Subjective:  Patient is 48 year old Caucasian female who has multiple GI problems and is here for scheduled visit. She was last seen in December 2013. She says heartburn is generally well controlled with double dose medications. She has breakthrough symptoms within a day of missing not taking her medication. Last week she had terrible case of heartburn when she did not take the evening dose. She denies dysphagia nausea or vomiting. She says her bowels move better with polythene glycol. However she continues to complain of epigastric and right upper quadrant pain. She has some degree of pain every day. She had at least 2-3 episodes every week when she has moderate to severe pain lasting from several minutes to hours. Her worst episode of pain occurs when she has migraine. She seemed to have less pain when she's not constipated. She also complains of rectal bleeding which only occurs with her bowel movements and she passes fresh blood. Lately she seen blood every week. She says she donates blood regularly and she has never received letter from [employer] stating that any of the test is abnormal. She states she will be 48 later this week and wants to go ahead with colonoscopy.  Current Medications: Current Outpatient Prescriptions  Medication Sig Dispense Refill  . b complex vitamins capsule Take 1 capsule by mouth daily.      . Calcium Carbonate Antacid (ALKA-SELTZER ANTACID PO) Take 2 tablets by mouth 2 (two) times daily as needed. Upset stomach.      . clonazePAM (KLONOPIN) 1 MG tablet Take 1 mg by mouth 3 (three) times daily as needed. Anxiety/depression.      . cyclobenzaprine (FLEXERIL) 10 MG tablet Take 10 mg by mouth 3 (three) times daily as needed. Migraines.      . dimenhyDRINATE (DRAMAMINE) 50 MG tablet Take 50 mg by mouth every 8 (eight) hours as needed. Upset stomach.      .  fexofenadine (ALLEGRA) 180 MG tablet Take 180 mg by mouth daily.      . hydrocortisone-pramoxine (ANALPRAM-HC) 2.5-1 % rectal cream Place 1 application rectally as needed.       . hyoscyamine (LEVBID) 0.375 MG 12 hr tablet 3 (three) times daily. TAKE 1 TABLET TWICE DAILY BEFORE MEAL.      Marland Kitchen ibuprofen (ADVIL,MOTRIN) 200 MG tablet Take 400 mg by mouth 2 (two) times daily as needed. Headaches/pain.      . Multiple Vitamin (DAILY VITAMIN PO) Take 1 tablet by mouth daily.       . naproxen sodium (ALEVE) 220 MG tablet Take 440 mg by mouth 2 (two) times daily as needed. Headaches/pain.      Marland Kitchen NEXIUM 40 MG capsule TAKE ONE CAPSULE TWICE DAILY  60 capsule  5  . polyethylene glycol powder (GLYCOLAX/MIRALAX) powder Take 255 g (1 Container total) by mouth 2 (two) times daily.  1054 g  5  . SALINE NASAL SPRAY NA Place 1 spray into the nose 4 (four) times daily as needed. Allergies.       No current facility-administered medications for this visit.     Objective: Blood pressure 114/70, pulse 72, temperature 98.7 F (37.1 C), temperature source Oral, resp. rate 18, height 5\' 3"  (1.6 m), weight 130 lb 6.4 oz (59.149 kg), last menstrual period 11/26/2013. Patient is alert and in no acute distress. Conjunctiva is pink. Sclera is nonicteric Oropharyngeal mucosa is normal. No neck masses or thyromegaly  noted. Cardiac exam with regular rhythm normal S1 and S2. No murmur or gallop noted. Lungs are clear to auscultation. Abdomen symmetrical. Bowel sounds are hyperactive. Abdomen is soft with tenderness at epigastrium and right upper quadrant both on superficial and deep palpation. No organomegaly or masses.  No LE edema or clubbing noted. She has a tattoo of right distal forearm.  Lab data reviewed; LFTs from September 18 2013. Bilirubin 0.3, AP 48, AST 23, ALT 15, total protein 7.2 and albumin 4.0.   Assessment:  #1. Right upper quadrant abdominal pain. She has history of SOD dysfunction and has undergone  3 ERCPs last of which was in May 2013 at [facility] by Dr. Cherlynn Kaiser. Branch. She has had normal LFTs in October and December last year when she had RUQ pain. Suspect her pain is secondary to IBS, superficial pain and she possibly has abdominal migraine. She may also have intermittent biliary spasm and LFTs ever means normal. #2. GERD. She is requiring double dose PPI for symptom control. #3. Constipation predominant IBS. She is doing better with therapy. #4. Recurrent hematochezia secondary to hemorrhoids.   Plan: Patient will continue GI medications at current dose. Colonoscopy to be scheduled in April 2015 with propofol. Need for hemorrhoidal treatment if any will be made based on colonoscopy. Office visit in one year.

## 2013-12-12 ENCOUNTER — Other Ambulatory Visit (INDEPENDENT_AMBULATORY_CARE_PROVIDER_SITE_OTHER): Payer: Self-pay | Admitting: *Deleted

## 2013-12-12 ENCOUNTER — Telehealth (INDEPENDENT_AMBULATORY_CARE_PROVIDER_SITE_OTHER): Payer: Self-pay | Admitting: *Deleted

## 2013-12-12 DIAGNOSIS — R1011 Right upper quadrant pain: Secondary | ICD-10-CM

## 2013-12-12 DIAGNOSIS — K5900 Constipation, unspecified: Secondary | ICD-10-CM

## 2013-12-12 DIAGNOSIS — Z1211 Encounter for screening for malignant neoplasm of colon: Secondary | ICD-10-CM

## 2013-12-12 MED ORDER — SOD PHOS MONO-SOD PHOS DIBASIC 1.102-0.398 G PO TABS: 32.0000 | ORAL_TABLET | Freq: Once | ORAL | Status: DC

## 2013-12-12 NOTE — Telephone Encounter (Signed)
Patient needs osmo pill prep 

## 2014-01-24 ENCOUNTER — Encounter (HOSPITAL_COMMUNITY): Payer: Self-pay | Admitting: Pharmacy Technician

## 2014-01-28 ENCOUNTER — Telehealth (INDEPENDENT_AMBULATORY_CARE_PROVIDER_SITE_OTHER): Payer: Self-pay | Admitting: *Deleted

## 2014-01-28 NOTE — Telephone Encounter (Signed)
Would like to speak with [name]. She is scheduled to have a TCS Thursday and has some questions to ask [name] before then. The return phone number is [phone].

## 2014-01-29 NOTE — Telephone Encounter (Signed)
Patient was called, message left. I informed her that today was a clinic day, and that I would try and return her call as I could.

## 2014-01-29 NOTE — Telephone Encounter (Signed)
Patient l/m wanting to know if dr Laural Golden could still do tcs while she has her monthly and i called patient back and left her a message that he could

## 2014-01-31 NOTE — Patient Instructions (Signed)
Marie Hurley  01/31/2014   Your procedure is scheduled on:  02/07/2014  Report to [facility] at  30  AM.  Call this number if you have problems the morning of surgery: [phone]   Remember:   Do not eat food or drink liquids after midnight.   Take these medicines the morning of surgery with A SIP OF WATER: hydrocodone, nexium, clonazepam,flexaril, allegra,levobid   Do not wear jewelry, make-up or nail polish.  Do not wear lotions, powders, or perfumes.   Do not shave 48 hours prior to surgery. Men may shave face and neck.  Do not bring valuables to the hospital.  [facility] is not responsible for any belongings or valuables.               Contacts, dentures or bridgework may not be worn into surgery.  Leave suitcase in the car. After surgery it may be brought to your room.  For patients admitted to the hospital, discharge time is determined by your treatment team.               Patients discharged the day of surgery will not be allowed to drive home.  Name and phone number of your driver: family Special Instructions: Shower using CHG 2 nights before surgery and the night before surgery.  If you shower the day of surgery use CHG.  Use special wash - you have one bottle of CHG for all showers.  You should use approximately 1/3 of the bottle for each shower.   Please read over the following fact sheets that you were given: Pain Booklet, Coughing and Deep Breathing, Surgical Site Infection Prevention, Anesthesia Post-op Instructions and Care and Recovery After Surgery Colonoscopy A colonoscopy is an exam to look at the entire large intestine (colon). This exam can help find problems such as tumors, polyps, inflammation, and areas of bleeding. The exam takes about 1 hour.  LET [facility] CARE PROVIDER KNOW ABOUT:   Any allergies you have.  All medicines you are taking, including vitamins, herbs, eye drops, creams, and over-the-counter medicines.  Previous problems you or  members of your family have had with the use of anesthetics.  Any blood disorders you have.  Previous surgeries you have had.  Medical conditions you have. RISKS AND COMPLICATIONS  Generally, this is a safe procedure. However, as with any procedure, complications can occur. Possible complications include:  Bleeding.  Tearing or rupture of the colon wall.  Reaction to medicines given during the exam.  Infection (rare). BEFORE THE PROCEDURE   Ask your health care provider about changing or stopping your regular medicines.  You may be prescribed an oral bowel prep. This involves drinking a large amount of medicated liquid, starting the day before your procedure. The liquid will cause you to have multiple loose stools until your stool is almost clear or light green. This cleans out your colon in preparation for the procedure.  Do not eat or drink anything else once you have started the bowel prep, unless your health care provider tells you it is safe to do so.  Arrange for someone to drive you home after the procedure. PROCEDURE   You will be given medicine to help you relax (sedative).  You will lie on your side with your knees bent.  A long, flexible tube with a light and camera on the end (colonoscope) will be inserted through the rectum and into the colon. The camera sends video back  to a computer screen as it moves through the colon. The colonoscope also releases carbon dioxide gas to inflate the colon. This helps your health care provider see the area better.  During the exam, your health care provider may take a small tissue sample (biopsy) to be examined under a microscope if any abnormalities are found.  The exam is finished when the entire colon has been viewed. AFTER THE PROCEDURE   Do not drive for 24 hours after the exam.  You may have a small amount of blood in your stool.  You may pass moderate amounts of gas and have mild abdominal cramping or bloating. This is  caused by the gas used to inflate your colon during the exam.  Ask when your test results will be ready and how you will get your results. Make sure you get your test results. Document Released: 09/24/2000 Document Revised: 07/18/2013 Document Reviewed: 06/04/2013 [facility] Patient Information 2014 [place]. PATIENT INSTRUCTIONS POST-ANESTHESIA  IMMEDIATELY FOLLOWING SURGERY:  Do not drive or operate machinery for the first twenty four hours after surgery.  Do not make any important decisions for twenty four hours after surgery or while taking narcotic pain medications or sedatives.  If you develop intractable nausea and vomiting or a severe headache please notify your doctor immediately.  FOLLOW-UP:  Please make an appointment with your surgeon as instructed. You do not need to follow up with anesthesia unless specifically instructed to do so.  WOUND CARE INSTRUCTIONS (if applicable):  Keep a dry clean dressing on the anesthesia/puncture wound site if there is drainage.  Once the wound has quit draining you may leave it open to air.  Generally you should leave the bandage intact for twenty four hours unless there is drainage.  If the epidural site drains for more than 36-48 hours please call the anesthesia department.  QUESTIONS?:  Please feel free to call your physician or the hospital operator if you have any questions, and they will be happy to assist you.

## 2014-02-01 ENCOUNTER — Encounter (HOSPITAL_COMMUNITY)
Admission: RE | Admit: 2014-02-01 | Discharge: 2014-02-01 | Disposition: A | Discharge status: Home / Self Care | Payer: PRIVATE HEALTH INSURANCE | Source: Ambulatory Visit | Attending: Internal Medicine | Admitting: Internal Medicine

## 2014-02-01 ENCOUNTER — Other Ambulatory Visit (INDEPENDENT_AMBULATORY_CARE_PROVIDER_SITE_OTHER): Payer: Self-pay | Admitting: *Deleted

## 2014-02-01 ENCOUNTER — Encounter (HOSPITAL_COMMUNITY): Payer: Self-pay

## 2014-02-01 DIAGNOSIS — Z01810 Encounter for preprocedural cardiovascular examination: Secondary | ICD-10-CM | POA: Insufficient documentation

## 2014-02-01 DIAGNOSIS — Z1211 Encounter for screening for malignant neoplasm of colon: Secondary | ICD-10-CM

## 2014-02-01 DIAGNOSIS — Z01812 Encounter for preprocedural laboratory examination: Secondary | ICD-10-CM | POA: Insufficient documentation

## 2014-02-01 HISTORY — DX: Other specified postprocedural states: Z98.890

## 2014-02-01 HISTORY — DX: Unspecified osteoarthritis, unspecified site: M19.90

## 2014-02-01 HISTORY — DX: Depression, unspecified: F32.A

## 2014-02-01 HISTORY — DX: Other seasonal allergic rhinitis: J30.2

## 2014-02-01 HISTORY — DX: Major depressive disorder, single episode, unspecified: F32.9

## 2014-02-01 HISTORY — DX: Anxiety disorder, unspecified: F41.9

## 2014-02-01 HISTORY — DX: Other specified postprocedural states: R11.2

## 2014-02-01 LAB — BASIC METABOLIC PANEL
BUN: 10 mg/dL (ref 6–23)
CHLORIDE: 102 meq/L (ref 96–112)
CO2: 26 mEq/L (ref 19–32)
Calcium: 9.4 mg/dL (ref 8.4–10.5)
Creatinine, Ser: 0.71 mg/dL (ref 0.50–1.10)
GFR calc non Af Amer: >90 mL/min (ref >90)
GLUCOSE: 83 mg/dL (ref 70–99)
POTASSIUM: 3.6 meq/L — AB (ref 3.7–5.3)
Sodium: 140 mEq/L (ref 137–147)

## 2014-02-01 LAB — HEMOGLOBIN AND HEMATOCRIT, BLOOD
HCT: 29.4 % — ABNORMAL LOW (ref 36.0–46.0)
Hemoglobin: 9.3 g/dL — ABNORMAL LOW (ref 12.0–15.0)

## 2014-02-01 NOTE — Pre-Procedure Instructions (Signed)
Pt. Given info to set up MyChart at home. 

## 2014-02-04 ENCOUNTER — Telehealth (INDEPENDENT_AMBULATORY_CARE_PROVIDER_SITE_OTHER): Payer: Self-pay | Admitting: *Deleted

## 2014-02-04 NOTE — Progress Notes (Signed)
Dr Laural Golden and Dr Patsey Berthold aware of HEM 9.3.

## 2014-02-04 NOTE — Telephone Encounter (Signed)
Per Dr.Rehman the patient will need to have a repeat lab as they discussed earlier.  As for the pain she is having with the arm,rotor cuff,into hand, she will need to see her PCP. A voice message was left for her on [phone] with those instructions.

## 2014-02-04 NOTE — Telephone Encounter (Signed)
Marie Hurley received Dr. Olevia Perches message about her iron being low. She was on her menstrual that was not heavy, would say moderate. She also has been swollen in her legs and hands. Her shoulder is hurting from the rotator cuff down to her hand. Didn't say which one. Would like to speak with [name] to see if there is something else that can be done before the lads. The return phone number is [phone].

## 2014-02-05 ENCOUNTER — Other Ambulatory Visit (HOSPITAL_COMMUNITY): Payer: Self-pay | Admitting: Orthopedic Surgery

## 2014-02-05 DIAGNOSIS — M25511 Pain in right shoulder: Secondary | ICD-10-CM

## 2014-02-07 ENCOUNTER — Encounter (HOSPITAL_COMMUNITY)
Admission: RE | Disposition: A | Discharge status: Home / Self Care | Payer: Self-pay | Source: Ambulatory Visit | Attending: Internal Medicine

## 2014-02-07 ENCOUNTER — Encounter (HOSPITAL_COMMUNITY): Payer: PRIVATE HEALTH INSURANCE | Admitting: Anesthesiology

## 2014-02-07 ENCOUNTER — Ambulatory Visit (HOSPITAL_COMMUNITY)
Admission: RE | Admit: 2014-02-07 | Discharge: 2014-02-07 | Disposition: A | Discharge status: Home / Self Care | Payer: PRIVATE HEALTH INSURANCE | Source: Ambulatory Visit | Attending: Internal Medicine | Admitting: Internal Medicine

## 2014-02-07 ENCOUNTER — Ambulatory Visit (HOSPITAL_COMMUNITY): Payer: PRIVATE HEALTH INSURANCE | Admitting: Anesthesiology

## 2014-02-07 ENCOUNTER — Encounter (HOSPITAL_COMMUNITY): Payer: Self-pay | Admitting: *Deleted

## 2014-02-07 DIAGNOSIS — F411 Generalized anxiety disorder: Secondary | ICD-10-CM | POA: Insufficient documentation

## 2014-02-07 DIAGNOSIS — Z79899 Other long term (current) drug therapy: Secondary | ICD-10-CM | POA: Insufficient documentation

## 2014-02-07 DIAGNOSIS — G8929 Other chronic pain: Secondary | ICD-10-CM | POA: Insufficient documentation

## 2014-02-07 DIAGNOSIS — K589 Irritable bowel syndrome without diarrhea: Secondary | ICD-10-CM | POA: Insufficient documentation

## 2014-02-07 DIAGNOSIS — Z1211 Encounter for screening for malignant neoplasm of colon: Secondary | ICD-10-CM | POA: Insufficient documentation

## 2014-02-07 DIAGNOSIS — Z880 Allergy status to penicillin: Secondary | ICD-10-CM | POA: Insufficient documentation

## 2014-02-07 DIAGNOSIS — F329 Major depressive disorder, single episode, unspecified: Secondary | ICD-10-CM | POA: Insufficient documentation

## 2014-02-07 DIAGNOSIS — R1031 Right lower quadrant pain: Secondary | ICD-10-CM | POA: Insufficient documentation

## 2014-02-07 DIAGNOSIS — R1013 Epigastric pain: Secondary | ICD-10-CM | POA: Insufficient documentation

## 2014-02-07 DIAGNOSIS — M129 Arthropathy, unspecified: Secondary | ICD-10-CM | POA: Insufficient documentation

## 2014-02-07 DIAGNOSIS — F3289 Other specified depressive episodes: Secondary | ICD-10-CM | POA: Insufficient documentation

## 2014-02-07 DIAGNOSIS — Z888 Allergy status to other drugs, medicaments and biological substances status: Secondary | ICD-10-CM | POA: Insufficient documentation

## 2014-02-07 DIAGNOSIS — K219 Gastro-esophageal reflux disease without esophagitis: Secondary | ICD-10-CM | POA: Insufficient documentation

## 2014-02-07 DIAGNOSIS — K644 Residual hemorrhoidal skin tags: Secondary | ICD-10-CM | POA: Insufficient documentation

## 2014-02-07 DIAGNOSIS — K5900 Constipation, unspecified: Secondary | ICD-10-CM | POA: Insufficient documentation

## 2014-02-07 DIAGNOSIS — Z85828 Personal history of other malignant neoplasm of skin: Secondary | ICD-10-CM | POA: Insufficient documentation

## 2014-02-07 HISTORY — PX: COLONOSCOPY WITH PROPOFOL: SHX5780

## 2014-02-07 LAB — CBC
HEMATOCRIT: 31.8 % — AB (ref 36.0–46.0)
Hemoglobin: 10.5 g/dL — ABNORMAL LOW (ref 12.0–15.0)
MCH: 26.1 pg (ref 26.0–34.0)
MCHC: 33 g/dL (ref 30.0–36.0)
MCV: 78.9 fL (ref 78.0–100.0)
PLATELETS: 399 10*3/uL (ref 150–400)
RBC: 4.03 MIL/uL (ref 3.87–5.11)
RDW: 15.6 % — ABNORMAL HIGH (ref 11.5–15.5)
WBC: 7 10*3/uL (ref 4.0–10.5)

## 2014-02-07 LAB — FERRITIN: FERRITIN: 7 ng/mL — AB (ref 10–291)

## 2014-02-07 LAB — IRON AND TIBC
Iron: 86 ug/dL (ref 42–135)
Saturation Ratios: 19 % — ABNORMAL LOW (ref 20–55)
TIBC: 445 ug/dL (ref 250–470)
UIBC: 359 ug/dL (ref 125–400)

## 2014-02-07 LAB — FOLATE: Folate: >20 ng/mL

## 2014-02-07 LAB — VITAMIN B12: Vitamin B-12: 1005 pg/mL — ABNORMAL HIGH (ref 211–911)

## 2014-02-07 SURGERY — COLONOSCOPY WITH PROPOFOL
Anesthesia: Monitor Anesthesia Care | Site: Rectum

## 2014-02-07 SURGERY — COLONOSCOPY WITH PROPOFOL
Anesthesia: Monitor Anesthesia Care

## 2014-02-07 MED ORDER — ONDANSETRON HCL 4 MG/2ML IJ SOLN: 4.0000 mg | Freq: Once | INTRAMUSCULAR | Status: DC | PRN

## 2014-02-07 MED ORDER — FENTANYL CITRATE 0.05 MG/ML IJ SOLN: 25.0000 ug | INTRAMUSCULAR | Status: DC | PRN

## 2014-02-07 MED ORDER — PROPOFOL 10 MG/ML IV BOLUS
INTRAVENOUS | Status: AC
  Filled 2014-02-07: qty 20

## 2014-02-07 MED ORDER — GLYCOPYRROLATE 0.2 MG/ML IJ SOLN
0.2000 mg | Freq: Once | INTRAMUSCULAR | Status: AC
  Administered 2014-02-07: 0.2 mg via INTRAVENOUS

## 2014-02-07 MED ORDER — MIDAZOLAM HCL 2 MG/2ML IJ SOLN
1.0000 mg | INTRAMUSCULAR | Status: DC | PRN
  Administered 2014-02-07: 2 mg via INTRAVENOUS

## 2014-02-07 MED ORDER — MIDAZOLAM HCL 2 MG/2ML IJ SOLN
INTRAMUSCULAR | Status: AC
  Filled 2014-02-07: qty 2

## 2014-02-07 MED ORDER — STERILE WATER FOR IRRIGATION IR SOLN
Status: DC | PRN
  Administered 2014-02-07: 08:00:00

## 2014-02-07 MED ORDER — FENTANYL CITRATE 0.05 MG/ML IJ SOLN
INTRAMUSCULAR | Status: DC | PRN
  Administered 2014-02-07 (×4): 25 ug via INTRAVENOUS

## 2014-02-07 MED ORDER — FENTANYL CITRATE 0.05 MG/ML IJ SOLN
25.0000 ug | INTRAMUSCULAR | Status: AC
  Administered 2014-02-07 (×2): 25 ug via INTRAVENOUS

## 2014-02-07 MED ORDER — FENTANYL CITRATE 0.05 MG/ML IJ SOLN
INTRAMUSCULAR | Status: AC
  Filled 2014-02-07: qty 2

## 2014-02-07 MED ORDER — LACTATED RINGERS IV SOLN
INTRAVENOUS | Status: DC
  Administered 2014-02-07: 07:00:00 via INTRAVENOUS

## 2014-02-07 MED ORDER — ONDANSETRON HCL 4 MG/2ML IJ SOLN
4.0000 mg | Freq: Once | INTRAMUSCULAR | Status: AC
  Administered 2014-02-07: 4 mg via INTRAVENOUS

## 2014-02-07 MED ORDER — ONDANSETRON HCL 4 MG/2ML IJ SOLN
INTRAMUSCULAR | Status: AC
  Filled 2014-02-07: qty 2

## 2014-02-07 MED ORDER — PROPOFOL INFUSION 10 MG/ML OPTIME
INTRAVENOUS | Status: DC | PRN
  Administered 2014-02-07: 60 ug/kg/min via INTRAVENOUS

## 2014-02-07 MED ORDER — MIDAZOLAM HCL 5 MG/5ML IJ SOLN
INTRAMUSCULAR | Status: DC | PRN
  Administered 2014-02-07: 2 mg via INTRAVENOUS
  Administered 2014-02-07: 3 mg via INTRAVENOUS
  Administered 2014-02-07: 1 mg via INTRAVENOUS

## 2014-02-07 MED ORDER — GLYCOPYRROLATE 0.2 MG/ML IJ SOLN
INTRAMUSCULAR | Status: AC
  Filled 2014-02-07: qty 1

## 2014-02-07 SURGICAL SUPPLY — 8 items
FLOOR PAD 36X40 (MISCELLANEOUS) ×3
KIT CLEAN ENDO COMPLIANCE (KITS) ×3 IMPLANT
LUBRICANT JELLY 4.5OZ STERILE (MISCELLANEOUS) ×3 IMPLANT
MANIFOLD NEPTUNE II (INSTRUMENTS) ×3 IMPLANT
PAD FLOOR 36X40 (MISCELLANEOUS) ×1 IMPLANT
TUBING ENDO SMARTCAP PENTAX (MISCELLANEOUS) ×3 IMPLANT
TUBING IRRIGATION ENDOGATOR (MISCELLANEOUS) ×3 IMPLANT
WATER STERILE IRR 1000ML POUR (IV SOLUTION) ×3 IMPLANT

## 2014-02-07 NOTE — H&P (Signed)
Marie Hurley is an 47 y.o. female.   Chief Complaint: Patient is here for colonoscopy. HPI: Patient is 48 year old Caucasian female who is here for screening colonoscopy. She had preprocedure lab studies her hemoglobin was noted to be 9.3 g. Patient says her hemoglobin was 13.1 g 9 weeks ago. About 2 weeks prior to her blood draw she donated blood. She has history of IBS but lately has been having constipation. She also has history of hemorrhoids with intermittent hematochezia but none recently. Her periods have not been heavy. She has chronic epigastric and RUQ pain which is felt to be multifactorial. She has undergone extensive evaluation in the past. History is negative for CRC.  Past Medical History  Diagnosis Date  . Bowel obstruction   . IBS (irritable bowel syndrome)   . GERD (gastroesophageal reflux disease)   . Migraine   . Basal cell carcinoma of chest wall 2015    rIGHT IN THE MIDDLE OF THE PATIENT'S CHEST  . Seasonal allergies   . Arthritis   . Depression   . Anxiety   . PONV (postoperative nausea and vomiting)     Past Surgical History  Procedure Laterality Date  . Tubal ligation    . Cholecystectomy    . Carpal tunnel release    . Ercp    . Dilation and curettage of uterus  '91 & '96    Family History  Problem Relation Age of Onset  . Hypertension Mother   . Heart disease Mother   . COPD Mother   . Heart disease Father   . Diabetes Father   . Hypertension Father   . Heart disease Sister   . Healthy Son   . Healthy Daughter    Social History:  reports that she has never smoked. She has never used smokeless tobacco. She reports that she drinks about .5 ounces of alcohol per week. She reports that she does not use illicit drugs.  Allergies:  Allergies  Allergen Reactions  . Penicillins Hives  . Promethazine Hcl Nausea And Vomiting    Medications Prior to Admission  Medication Sig Dispense Refill  . B Complex-C (B-COMPLEX WITH VITAMIN C) tablet Take 1  tablet by mouth daily.      . Calcium Carbonate Antacid (ALKA-SELTZER ANTACID PO) Take 2 tablets by mouth 2 (two) times daily as needed. Upset stomach.      . clonazePAM (KLONOPIN) 1 MG tablet Take 1 mg by mouth 3 (three) times daily as needed. Anxiety/depression.      . cyclobenzaprine (FLEXERIL) 10 MG tablet Take 10 mg by mouth 3 (three) times daily as needed for muscle spasms (migraines).       Marland Kitchen dimenhyDRINATE (DRAMAMINE) 50 MG tablet Take 50 mg by mouth every 8 (eight) hours as needed. Upset stomach.      . fexofenadine (ALLEGRA) 180 MG tablet Take 180 mg by mouth daily.      Marland Kitchen HYDROcodone-acetaminophen (NORCO/VICODIN) 5-325 MG per tablet Take 1 tablet by mouth every 6 (six) hours as needed (migraines).      . hydrocortisone-pramoxine (ANALPRAM-HC) 2.5-1 % rectal cream Place 1 application rectally as needed for hemorrhoids.       . hyoscyamine (LEVBID) 0.375 MG 12 hr tablet Take 0.375 mg by mouth 3 (three) times daily before meals.       Marland Kitchen ibuprofen (ADVIL,MOTRIN) 200 MG tablet Take 400 mg by mouth 2 (two) times daily as needed. Headaches/pain.      . Multiple Vitamin (DAILY VITAMIN PO)  Take 1 tablet by mouth daily.       . Multiple Vitamins-Minerals (AIRBORNE PO) Take 1 tablet by mouth daily.      . naproxen sodium (ALEVE) 220 MG tablet Take 440 mg by mouth 2 (two) times daily as needed. Headaches/pain.      Marland Kitchen NEXIUM 40 MG capsule TAKE ONE CAPSULE TWICE DAILY  60 capsule  5  . polyethylene glycol powder (GLYCOLAX/MIRALAX) powder Take 255 g (1 Container total) by mouth 2 (two) times daily.  1054 g  5  . SALINE NASAL SPRAY NA Place 1 spray into the nose 4 (four) times daily as needed. Allergies.      . sodium phosphates (OSMOPREP) 1.102-0.398 G TABS Take 32 tablets by mouth once.  32 tablet  0    No results found for this or any previous visit (from the past 48 hour(s)). No results found.  ROS  Blood pressure 118/82, pulse 87, temperature 98.1 F (36.7 C), temperature source Oral, resp.  rate 20, height 5\' 3"  (1.6 m), weight 134 lb (60.782 kg), last menstrual period 01/30/2014, SpO2 100.00%. Physical Exam  Constitutional: She appears well-developed and well-nourished.  HENT:  Mouth/Throat: Oropharynx is clear and moist.  Eyes: Conjunctivae are normal. No scleral icterus.  Neck: No thyromegaly present.  Cardiovascular: Normal rate, regular rhythm and normal heart sounds.   No murmur heard. Respiratory: Effort normal and breath sounds normal.  GI: Soft. She exhibits no mass. Tenderness: mild tenderness at epigastrium and RUQ.  Musculoskeletal: She exhibits no edema.  Lymphadenopathy:    She has no cervical adenopathy.  Neurological: She is alert.  Skin: Skin is warm and dry.     Assessment/Plan Average risk screening colonoscopy with MAC. Will check CBC, serum iron, TIBC and ferritin as well as B12 and folate levels.   U  02/07/2014, 7:21 AM

## 2014-02-07 NOTE — Discharge Instructions (Signed)
Resume usual medications and diet. No driving for 24 hours. Physician will call with results of blood tests.  Colonoscopy, Care After Refer to this sheet in the next few weeks. These instructions provide you with information on caring for yourself after your procedure. Your health care provider may also give you more specific instructions. Your treatment has been planned according to current medical practices, but problems sometimes occur. Call your health care provider if you have any problems or questions after your procedure. WHAT TO EXPECT AFTER THE PROCEDURE  After your procedure, it is typical to have the following:  A small amount of blood in your stool.  Moderate amounts of gas and mild abdominal cramping or bloating. HOME CARE INSTRUCTIONS  Do not drive, operate machinery, or sign important documents for 24 hours.  You may shower and resume your regular physical activities, but move at a slower pace for the first 24 hours.  Take frequent rest periods for the first 24 hours.  Walk around or put a warm pack on your abdomen to help reduce abdominal cramping and bloating.  Drink enough fluids to keep your urine clear or pale yellow.  You may resume your normal diet as instructed by your health care provider. Avoid heavy or fried foods that are hard to digest.  Avoid drinking alcohol for 24 hours or as instructed by your health care provider.  Only take over-the-counter or prescription medicines as directed by your health care provider.  If a tissue sample (biopsy) was taken during your procedure:  Do not take aspirin or blood thinners for 7 days, or as instructed by your health care provider.  Do not drink alcohol for 7 days, or as instructed by your health care provider.  Eat soft foods for the first 24 hours. SEEK MEDICAL CARE IF: You have persistent spotting of blood in your stool 2 3 days after the procedure. SEEK IMMEDIATE MEDICAL CARE IF:  You have more than a  small spotting of blood in your stool.  You pass large blood clots in your stool.  Your abdomen is swollen (distended).  You have nausea or vomiting.  You have a fever.  You have increasing abdominal pain that is not relieved with medicine. Document Released: 05/11/2004 Document Revised: 07/18/2013 Document Reviewed: 06/04/2013 [facility] Patient Information 2014 [place].

## 2014-02-07 NOTE — Op Note (Signed)
COLONOSCOPY PROCEDURE REPORT  PATIENT:  Marie Hurley  MR#:  [id number] Birthdate:  19-Mar-1966, 48 y.o., female Endoscopist:  Dr. Rogene Houston, MD Referred By:  Dr. Glenda Chroman, MD Procedure Date: 02/07/2014  Procedure:   Colonoscopy  Indications:  Patient is 48 year old Caucasian female who is undergoing average risk screening colonoscopy. Patient had to procedure blood work and hemoglobin was noted to be 9.3. Her hemoglobin was 13.1 g 9 weeks ago. It was checked this morning it is 10.5 g. Iron studies B12 and folate levels are pending.  Informed Consent:  The procedure and risks were reviewed with the patient and informed consent was obtained.  Medications:  Monitored anesthesia care. He see anesthesia records for details.  Description of procedure:  After a digital rectal exam was performed, that colonoscope was advanced from the anus through the rectum and colon to the area of the cecum, ileocecal valve and appendiceal orifice. The cecum was deeply intubated. These structures were well-seen and photographed for the record. From the level of the cecum and ileocecal valve, the scope was slowly and cautiously withdrawn. The mucosal surfaces were carefully surveyed utilizing scope tip to flexion to facilitate fold flattening as needed. The scope was pulled down into the rectum where a thorough exam including retroflexion was performed.  Findings:   Prep satisfactory. Some fecal matter noted in in the cecum. Most of it was suctioned out. Position was changed in order to see appendiceal orifice. Normal mucosa throughout without polyps or other abnormalities. Normal rectal mucosa. Small hemorrhoids below the dentate line, single anal papilla and anal skin tags.   Therapeutic/Diagnostic Maneuvers Performed:   None  Complications:  None  Cecal Withdrawal Time:  9 minutes  Impression:  Normal colonoscopy except external hemorrhoids and single small anal papilla.  Recommendations:   Standard instructions given. Next screening exam in 10 years. I will be contacting patient with results of iron studies, B12 and folate levels.  Rogene Houston  02/07/2014 8:14 AM  CC: Dr. Glenda Chroman., MD & Dr. Rayne Du ref. provider found

## 2014-02-07 NOTE — Anesthesia Postprocedure Evaluation (Signed)
  Anesthesia Post-op Note  Patient: Marie Hurley  Procedure(s) Performed: Procedure(s) with comments: COLONOSCOPY WITH PROPOFOL (N/A) - entered cecum @ 0756 ; total cecal withdrawal time- 9 min  Patient Location: PACU  Anesthesia Type:MAC  Level of Consciousness: awake, alert , oriented and patient cooperative  Airway and Oxygen Therapy: Patient Spontanous Breathing  Post-op Pain: none  Post-op Assessment: Post-op Vital signs reviewed, Patient's Cardiovascular Status Stable, Respiratory Function Stable, Patent Airway, No signs of Nausea or vomiting and Pain level controlled  Post-op Vital Signs: Reviewed and stable  Last Vitals:  Filed Vitals:   02/07/14 0730  BP: 111/74  Pulse:   Temp:   Resp: 74    Complications: No apparent anesthesia complications

## 2014-02-07 NOTE — Anesthesia Preprocedure Evaluation (Signed)
Anesthesia Evaluation  Patient identified by MRN, date of birth, ID band Patient awake    Reviewed: Allergy & Precautions, H&P , NPO status , Patient's Chart, lab work & pertinent test results  History of Anesthesia Complications (+) PONV and history of anesthetic complications  Airway Mallampati: I TM Distance: >3 FB     Dental  (+) Teeth Intact   Pulmonary neg pulmonary ROS,  breath sounds clear to auscultation        Cardiovascular negative cardio ROS  Rhythm:Regular     Neuro/Psych  Headaches, PSYCHIATRIC DISORDERS Anxiety Depression    GI/Hepatic GERD-  Medicated and Controlled,  Endo/Other    Renal/GU      Musculoskeletal   Abdominal   Peds  Hematology   Anesthesia Other Findings   Reproductive/Obstetrics                           Anesthesia Physical Anesthesia Plan  ASA: II  Anesthesia Plan: MAC   Post-op Pain Management:    Induction: Intravenous  Airway Management Planned: Simple Face Mask  Additional Equipment:   Intra-op Plan:   Post-operative Plan:   Informed Consent: I have reviewed the patients History and Physical, chart, labs and discussed the procedure including the risks, benefits and alternatives for the proposed anesthesia with the patient or authorized representative who has indicated his/her understanding and acceptance.     Plan Discussed with:   Anesthesia Plan Comments:         Anesthesia Quick Evaluation

## 2014-02-07 NOTE — Transfer of Care (Signed)
Immediate Anesthesia Transfer of Care Note  Patient: Marie Hurley  Procedure(s) Performed: Procedure(s) with comments: COLONOSCOPY WITH PROPOFOL (N/A) - entered cecum @ 0756 ; total cecal withdrawal time- 9 min  Patient Location: PACU  Anesthesia Type:MAC  Level of Consciousness: awake and patient cooperative  Airway & Oxygen Therapy: Patient Spontanous Breathing and Patient connected to nasal cannula oxygen  Post-op Assessment: Report given to PACU RN, Post -op Vital signs reviewed and stable and Patient moving all extremities  Post vital signs: Reviewed and stable  Complications: No apparent anesthesia complications

## 2014-02-08 ENCOUNTER — Encounter (HOSPITAL_COMMUNITY): Payer: Self-pay | Admitting: Internal Medicine

## 2014-02-11 ENCOUNTER — Telehealth (INDEPENDENT_AMBULATORY_CARE_PROVIDER_SITE_OTHER): Payer: Self-pay | Admitting: *Deleted

## 2014-02-11 ENCOUNTER — Ambulatory Visit (HOSPITAL_COMMUNITY): Payer: 59

## 2014-02-11 DIAGNOSIS — D649 Anemia, unspecified: Secondary | ICD-10-CM

## 2014-02-11 NOTE — Telephone Encounter (Signed)
Per Dr.Rehman the patient will need to have labs drawn in 8 weeks. 

## 2014-02-12 ENCOUNTER — Other Ambulatory Visit (HOSPITAL_COMMUNITY): Payer: Self-pay | Admitting: Orthopedic Surgery

## 2014-02-12 DIAGNOSIS — M25511 Pain in right shoulder: Secondary | ICD-10-CM

## 2014-02-20 ENCOUNTER — Ambulatory Visit (HOSPITAL_COMMUNITY): Payer: PRIVATE HEALTH INSURANCE

## 2014-02-20 ENCOUNTER — Other Ambulatory Visit (HOSPITAL_COMMUNITY): Payer: PRIVATE HEALTH INSURANCE

## 2014-02-27 ENCOUNTER — Ambulatory Visit (HOSPITAL_COMMUNITY): Payer: PRIVATE HEALTH INSURANCE

## 2014-02-27 ENCOUNTER — Other Ambulatory Visit (HOSPITAL_COMMUNITY): Payer: PRIVATE HEALTH INSURANCE

## 2014-03-06 ENCOUNTER — Encounter (INDEPENDENT_AMBULATORY_CARE_PROVIDER_SITE_OTHER): Payer: Self-pay | Admitting: *Deleted

## 2014-03-06 ENCOUNTER — Telehealth (INDEPENDENT_AMBULATORY_CARE_PROVIDER_SITE_OTHER): Payer: Self-pay | Admitting: *Deleted

## 2014-03-06 DIAGNOSIS — K589 Irritable bowel syndrome without diarrhea: Secondary | ICD-10-CM

## 2014-03-06 DIAGNOSIS — K649 Unspecified hemorrhoids: Secondary | ICD-10-CM

## 2014-03-06 NOTE — Telephone Encounter (Signed)
Per Dr.Rehman the patient will need to have labs drawn. 

## 2014-03-12 ENCOUNTER — Ambulatory Visit (HOSPITAL_COMMUNITY)
Admission: RE | Admit: 2014-03-12 | Discharge: 2014-03-12 | Disposition: A | Discharge status: Home / Self Care | Payer: PRIVATE HEALTH INSURANCE | Source: Ambulatory Visit | Attending: Orthopedic Surgery | Admitting: Orthopedic Surgery

## 2014-03-12 ENCOUNTER — Other Ambulatory Visit (HOSPITAL_COMMUNITY): Payer: PRIVATE HEALTH INSURANCE

## 2014-03-18 ENCOUNTER — Encounter (INDEPENDENT_AMBULATORY_CARE_PROVIDER_SITE_OTHER): Payer: Self-pay

## 2014-04-09 LAB — HEMOGLOBIN AND HEMATOCRIT, BLOOD
HCT: 35.3 % — ABNORMAL LOW (ref 36.0–46.0)
Hemoglobin: 11.7 g/dL — ABNORMAL LOW (ref 12.0–15.0)

## 2014-04-15 ENCOUNTER — Telehealth (INDEPENDENT_AMBULATORY_CARE_PROVIDER_SITE_OTHER): Payer: Self-pay | Admitting: *Deleted

## 2014-04-15 DIAGNOSIS — K649 Unspecified hemorrhoids: Secondary | ICD-10-CM

## 2014-04-15 DIAGNOSIS — K589 Irritable bowel syndrome without diarrhea: Secondary | ICD-10-CM

## 2014-04-15 NOTE — Telephone Encounter (Signed)
Per Dr.Rehman the patient will need to have labs drawn in 2 months. 

## 2014-04-23 ENCOUNTER — Ambulatory Visit (HOSPITAL_COMMUNITY): Payer: Self-pay | Admitting: Psychiatry

## 2014-05-02 ENCOUNTER — Other Ambulatory Visit (HOSPITAL_COMMUNITY): Payer: Self-pay | Admitting: Orthopedic Surgery

## 2014-05-02 ENCOUNTER — Ambulatory Visit (HOSPITAL_COMMUNITY)
Admission: RE | Admit: 2014-05-02 | Discharge: 2014-05-02 | Disposition: A | Discharge status: Home / Self Care | Payer: PRIVATE HEALTH INSURANCE | Source: Ambulatory Visit | Attending: Orthopedic Surgery | Admitting: Orthopedic Surgery

## 2014-05-02 ENCOUNTER — Ambulatory Visit (HOSPITAL_COMMUNITY): Admission: RE | Admit: 2014-05-02 | Payer: 59 | Source: Ambulatory Visit

## 2014-05-02 DIAGNOSIS — X58XXXA Exposure to other specified factors, initial encounter: Secondary | ICD-10-CM | POA: Insufficient documentation

## 2014-05-02 DIAGNOSIS — S46819A Strain of other muscles, fascia and tendons at shoulder and upper arm level, unspecified arm, initial encounter: Secondary | ICD-10-CM | POA: Insufficient documentation

## 2014-05-02 DIAGNOSIS — M67919 Unspecified disorder of synovium and tendon, unspecified shoulder: Secondary | ICD-10-CM | POA: Insufficient documentation

## 2014-05-02 DIAGNOSIS — M25519 Pain in unspecified shoulder: Secondary | ICD-10-CM | POA: Insufficient documentation

## 2014-05-02 DIAGNOSIS — M25511 Pain in right shoulder: Secondary | ICD-10-CM

## 2014-05-02 DIAGNOSIS — M719 Bursopathy, unspecified: Secondary | ICD-10-CM | POA: Insufficient documentation

## 2014-05-02 MED ORDER — POVIDONE-IODINE 10 % EX SOLN
CUTANEOUS | Status: AC
  Filled 2014-05-02: qty 15

## 2014-05-02 MED ORDER — SODIUM CHLORIDE 0.9 % IJ SOLN
INTRAMUSCULAR | Status: AC
  Filled 2014-05-02: qty 3

## 2014-05-02 MED ORDER — GADOBENATE DIMEGLUMINE 529 MG/ML IV SOLN
5.0000 mL | Freq: Once | INTRAVENOUS | Status: AC | PRN
Start: 2014-05-02 — End: 2014-05-02
  Administered 2014-05-02: 0.1 mL via INTRAVENOUS

## 2014-05-02 MED ORDER — IOHEXOL 300 MG/ML  SOLN
50.0000 mL | Freq: Once | INTRAMUSCULAR | Status: AC | PRN
  Administered 2014-05-02: 10 mL via INTRAVENOUS

## 2014-05-02 MED ORDER — POVIDONE-IODINE 10 % EX SOLN
CUTANEOUS | Status: AC
  Administered 2014-05-02: 12:00:00
  Filled 2014-05-02: qty 15

## 2014-05-02 MED ORDER — LIDOCAINE HCL (PF) 1 % IJ SOLN
INTRAMUSCULAR | Status: AC
  Administered 2014-05-02: 5 mL
  Filled 2014-05-02: qty 5

## 2014-05-09 ENCOUNTER — Ambulatory Visit (HOSPITAL_COMMUNITY): Payer: Self-pay | Admitting: Psychiatry

## 2014-05-21 ENCOUNTER — Encounter (HOSPITAL_BASED_OUTPATIENT_CLINIC_OR_DEPARTMENT_OTHER): Payer: Self-pay | Admitting: *Deleted

## 2014-05-21 NOTE — Progress Notes (Signed)
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

## 2014-05-24 ENCOUNTER — Ambulatory Visit (HOSPITAL_BASED_OUTPATIENT_CLINIC_OR_DEPARTMENT_OTHER): Payer: PRIVATE HEALTH INSURANCE | Admitting: Certified Registered"

## 2014-05-24 ENCOUNTER — Encounter (HOSPITAL_BASED_OUTPATIENT_CLINIC_OR_DEPARTMENT_OTHER)
Admission: RE | Disposition: A | Discharge status: Home / Self Care | Payer: Self-pay | Source: Ambulatory Visit | Attending: Orthopedic Surgery

## 2014-05-24 ENCOUNTER — Ambulatory Visit (HOSPITAL_BASED_OUTPATIENT_CLINIC_OR_DEPARTMENT_OTHER)
Admission: RE | Admit: 2014-05-24 | Discharge: 2014-05-24 | Disposition: A | Discharge status: Home / Self Care | Payer: PRIVATE HEALTH INSURANCE | Source: Ambulatory Visit | Attending: Orthopedic Surgery | Admitting: Orthopedic Surgery

## 2014-05-24 ENCOUNTER — Encounter (HOSPITAL_BASED_OUTPATIENT_CLINIC_OR_DEPARTMENT_OTHER): Payer: Self-pay | Admitting: Anesthesiology

## 2014-05-24 ENCOUNTER — Encounter (HOSPITAL_BASED_OUTPATIENT_CLINIC_OR_DEPARTMENT_OTHER): Payer: PRIVATE HEALTH INSURANCE | Admitting: Certified Registered"

## 2014-05-24 DIAGNOSIS — G43909 Migraine, unspecified, not intractable, without status migrainosus: Secondary | ICD-10-CM | POA: Insufficient documentation

## 2014-05-24 DIAGNOSIS — M75121 Complete rotator cuff tear or rupture of right shoulder, not specified as traumatic: Secondary | ICD-10-CM

## 2014-05-24 DIAGNOSIS — F329 Major depressive disorder, single episode, unspecified: Secondary | ICD-10-CM | POA: Insufficient documentation

## 2014-05-24 DIAGNOSIS — M67919 Unspecified disorder of synovium and tendon, unspecified shoulder: Secondary | ICD-10-CM | POA: Diagnosis not present

## 2014-05-24 DIAGNOSIS — M25819 Other specified joint disorders, unspecified shoulder: Secondary | ICD-10-CM | POA: Diagnosis present

## 2014-05-24 DIAGNOSIS — Z9089 Acquired absence of other organs: Secondary | ICD-10-CM | POA: Diagnosis not present

## 2014-05-24 DIAGNOSIS — K219 Gastro-esophageal reflux disease without esophagitis: Secondary | ICD-10-CM | POA: Insufficient documentation

## 2014-05-24 DIAGNOSIS — Z888 Allergy status to other drugs, medicaments and biological substances status: Secondary | ICD-10-CM | POA: Diagnosis not present

## 2014-05-24 DIAGNOSIS — F411 Generalized anxiety disorder: Secondary | ICD-10-CM | POA: Insufficient documentation

## 2014-05-24 DIAGNOSIS — M129 Arthropathy, unspecified: Secondary | ICD-10-CM | POA: Diagnosis not present

## 2014-05-24 DIAGNOSIS — Z79899 Other long term (current) drug therapy: Secondary | ICD-10-CM | POA: Insufficient documentation

## 2014-05-24 DIAGNOSIS — M7580 Other shoulder lesions, unspecified shoulder: Principal | ICD-10-CM

## 2014-05-24 DIAGNOSIS — Z880 Allergy status to penicillin: Secondary | ICD-10-CM | POA: Insufficient documentation

## 2014-05-24 DIAGNOSIS — F3289 Other specified depressive episodes: Secondary | ICD-10-CM | POA: Insufficient documentation

## 2014-05-24 DIAGNOSIS — M719 Bursopathy, unspecified: Secondary | ICD-10-CM | POA: Insufficient documentation

## 2014-05-24 DIAGNOSIS — M75120 Complete rotator cuff tear or rupture of unspecified shoulder, not specified as traumatic: Secondary | ICD-10-CM | POA: Diagnosis present

## 2014-05-24 HISTORY — DX: Personal history of Methicillin resistant Staphylococcus aureus infection: Z86.14

## 2014-05-24 HISTORY — DX: Complete rotator cuff tear or rupture of unspecified shoulder, not specified as traumatic: M75.120

## 2014-05-24 HISTORY — PX: SHOULDER ARTHROSCOPY WITH SUBACROMIAL DECOMPRESSION, ROTATOR CUFF REPAIR AND BICEP TENDON REPAIR: SHX5687

## 2014-05-24 LAB — POCT HEMOGLOBIN-HEMACUE: Hemoglobin: 10.4 g/dL — ABNORMAL LOW (ref 12.0–15.0)

## 2014-05-24 SURGERY — SHOULDER ARTHROSCOPY WITH SUBACROMIAL DECOMPRESSION, ROTATOR CUFF REPAIR AND BICEP TENDON REPAIR
Anesthesia: Regional | Site: Shoulder | Laterality: Right

## 2014-05-24 MED ORDER — SCOPOLAMINE 1 MG/3DAYS TD PT72
1.0000 | MEDICATED_PATCH | TRANSDERMAL | Status: DC
Start: 2014-05-24 — End: 2014-05-24
  Administered 2014-05-24: 1.5 mg via TRANSDERMAL

## 2014-05-24 MED ORDER — SUCCINYLCHOLINE CHLORIDE 20 MG/ML IJ SOLN
INTRAMUSCULAR | Status: DC | PRN
  Administered 2014-05-24: 100 mg via INTRAVENOUS

## 2014-05-24 MED ORDER — SENNA-DOCUSATE SODIUM 8.6-50 MG PO TABS: 2.0000 | ORAL_TABLET | Freq: Every day | ORAL | Status: DC

## 2014-05-24 MED ORDER — BUPIVACAINE-EPINEPHRINE (PF) 0.5% -1:200000 IJ SOLN
INTRAMUSCULAR | Status: DC | PRN
  Administered 2014-05-24: 30 mL via PERINEURAL

## 2014-05-24 MED ORDER — CEFAZOLIN SODIUM-DEXTROSE 2-3 GM-% IV SOLR
INTRAVENOUS | Status: AC
  Filled 2014-05-24: qty 50

## 2014-05-24 MED ORDER — OXYCODONE-ACETAMINOPHEN 10-325 MG PO TABS: 1.0000 | ORAL_TABLET | Freq: Four times a day (QID) | ORAL | Status: DC | PRN

## 2014-05-24 MED ORDER — DEXAMETHASONE SODIUM PHOSPHATE 4 MG/ML IJ SOLN
INTRAMUSCULAR | Status: DC | PRN
  Administered 2014-05-24: 10 mg via INTRAVENOUS

## 2014-05-24 MED ORDER — OXYCODONE HCL 5 MG PO TABS: 5.0000 mg | ORAL_TABLET | Freq: Once | ORAL | Status: DC | PRN

## 2014-05-24 MED ORDER — CEFAZOLIN SODIUM-DEXTROSE 2-3 GM-% IV SOLR
2.0000 g | INTRAVENOUS | Status: AC
  Administered 2014-05-24: 2 g via INTRAVENOUS

## 2014-05-24 MED ORDER — MIDAZOLAM HCL 2 MG/2ML IJ SOLN
1.0000 mg | INTRAMUSCULAR | Status: DC | PRN
Start: 2014-05-24 — End: 2014-05-24
  Administered 2014-05-24: 2 mg via INTRAVENOUS

## 2014-05-24 MED ORDER — FENTANYL CITRATE 0.05 MG/ML IJ SOLN
INTRAMUSCULAR | Status: AC
  Filled 2014-05-24: qty 2

## 2014-05-24 MED ORDER — HYDROMORPHONE HCL PF 1 MG/ML IJ SOLN: 0.2500 mg | INTRAMUSCULAR | Status: DC | PRN

## 2014-05-24 MED ORDER — OXYCODONE HCL 5 MG/5ML PO SOLN: 5.0000 mg | Freq: Once | ORAL | Status: DC | PRN

## 2014-05-24 MED ORDER — ONDANSETRON HCL 4 MG PO TABS: 4.0000 mg | ORAL_TABLET | Freq: Three times a day (TID) | ORAL | Status: DC | PRN

## 2014-05-24 MED ORDER — MIDAZOLAM HCL 2 MG/2ML IJ SOLN
INTRAMUSCULAR | Status: AC
  Filled 2014-05-24: qty 2

## 2014-05-24 MED ORDER — LIDOCAINE HCL (CARDIAC) 20 MG/ML IV SOLN
INTRAVENOUS | Status: DC | PRN
  Administered 2014-05-24: 50 mg via INTRAVENOUS

## 2014-05-24 MED ORDER — LACTATED RINGERS IV SOLN
INTRAVENOUS | Status: DC
  Administered 2014-05-24: 08:00:00 via INTRAVENOUS

## 2014-05-24 MED ORDER — CYCLOBENZAPRINE HCL 10 MG PO TABS: 10.0000 mg | ORAL_TABLET | Freq: Three times a day (TID) | ORAL | Status: DC | PRN

## 2014-05-24 MED ORDER — PROMETHAZINE HCL 25 MG PO TABS: 25.0000 mg | ORAL_TABLET | Freq: Four times a day (QID) | ORAL | Status: DC | PRN

## 2014-05-24 MED ORDER — SODIUM CHLORIDE 0.9 % IR SOLN
Status: DC | PRN
  Administered 2014-05-24: 12000 mL

## 2014-05-24 MED ORDER — ONDANSETRON HCL 4 MG/2ML IJ SOLN: 4.0000 mg | Freq: Four times a day (QID) | INTRAMUSCULAR | Status: DC | PRN

## 2014-05-24 MED ORDER — FENTANYL CITRATE 0.05 MG/ML IJ SOLN
50.0000 ug | INTRAMUSCULAR | Status: DC | PRN
  Administered 2014-05-24: 100 ug via INTRAVENOUS

## 2014-05-24 MED ORDER — FENTANYL CITRATE 0.05 MG/ML IJ SOLN
INTRAMUSCULAR | Status: DC | PRN
  Administered 2014-05-24 (×2): 50 ug via INTRAVENOUS

## 2014-05-24 MED ORDER — PROPOFOL 10 MG/ML IV BOLUS
INTRAVENOUS | Status: DC | PRN
Start: 2014-05-24 — End: 2014-05-24
  Administered 2014-05-24: 150 mg via INTRAVENOUS

## 2014-05-24 MED ORDER — SCOPOLAMINE 1 MG/3DAYS TD PT72
MEDICATED_PATCH | TRANSDERMAL | Status: AC
  Filled 2014-05-24: qty 1

## 2014-05-24 SURGICAL SUPPLY — 73 items
ANCH SUT SWLK 19.1 CLS EYLT TL (Anchor) ×1 IMPLANT
ANCHOR SUT BIO SW 4.75 W/FIB (Anchor) ×3 IMPLANT
BLADE CUTTER GATOR 3.5 (BLADE) ×3 IMPLANT
BLADE GREAT WHITE 4.2 (BLADE) IMPLANT
BLADE GREAT WHITE 4.2MM (BLADE)
BLADE SURG 15 STRL LF DISP TIS (BLADE) IMPLANT
BLADE SURG 15 STRL SS (BLADE)
BUR OVAL 4.0 (BURR) ×3 IMPLANT
BUR OVAL 6.0 (BURR) ×3 IMPLANT
CANNULA 5.75X71 LONG (CANNULA) ×3 IMPLANT
CANNULA TWIST IN 8.25X7CM (CANNULA) ×6 IMPLANT
CLOSURE WOUND 1/2 X4 (GAUZE/BANDAGES/DRESSINGS) ×1
CUTTER MENISCUS  4.2MM (BLADE)
CUTTER MENISCUS 4.2MM (BLADE) IMPLANT
DECANTER SPIKE VIAL GLASS SM (MISCELLANEOUS) IMPLANT
DRAPE INCISE IOBAN 66X45 STRL (DRAPES) ×3 IMPLANT
DRAPE SHOULDER BEACH CHAIR (DRAPES) ×3 IMPLANT
DRAPE U 20/CS (DRAPES) ×3 IMPLANT
DRAPE U-SHAPE 47X51 STRL (DRAPES) ×3 IMPLANT
DRSG PAD ABDOMINAL 8X10 ST (GAUZE/BANDAGES/DRESSINGS) ×3 IMPLANT
DURAPREP 26ML APPLICATOR (WOUND CARE) ×3 IMPLANT
ELECT REM PT RETURN 9FT ADLT (ELECTROSURGICAL)
ELECTRODE REM PT RTRN 9FT ADLT (ELECTROSURGICAL) IMPLANT
FIBERSTICK 2 (SUTURE) IMPLANT
GAUZE SPONGE 4X4 12PLY STRL (GAUZE/BANDAGES/DRESSINGS) ×3 IMPLANT
GLOVE BIO SURGEON STRL SZ8 (GLOVE) ×3 IMPLANT
GLOVE BIOGEL M STRL SZ7.5 (GLOVE) ×3 IMPLANT
GLOVE BIOGEL PI IND STRL 8 (GLOVE) ×4 IMPLANT
GLOVE BIOGEL PI INDICATOR 8 (GLOVE) ×8
GLOVE ORTHO TXT STRL SZ7.5 (GLOVE) ×3 IMPLANT
GOWN STRL REUS W/ TWL LRG LVL3 (GOWN DISPOSABLE) ×1 IMPLANT
GOWN STRL REUS W/ TWL XL LVL3 (GOWN DISPOSABLE) ×3 IMPLANT
GOWN STRL REUS W/TWL LRG LVL3 (GOWN DISPOSABLE) ×3
GOWN STRL REUS W/TWL XL LVL3 (GOWN DISPOSABLE) ×9
IMMOBILIZER SHOULDER FOAM XLGE (SOFTGOODS) IMPLANT
KIT BIO-TENODESIS 3X8 DISP (MISCELLANEOUS)
KIT INSRT BABSR STRL DISP BTN (MISCELLANEOUS) IMPLANT
KIT SHOULDER TRACTION (DRAPES) ×3 IMPLANT
MANIFOLD NEPTUNE II (INSTRUMENTS) ×3 IMPLANT
NDL SUT 6 .5 CRC .975X.05 MAYO (NEEDLE) IMPLANT
NEEDLE MAYO TAPER (NEEDLE)
NEEDLE SCORPION MULTI FIRE (NEEDLE) IMPLANT
PACK ARTHROSCOPY DSU (CUSTOM PROCEDURE TRAY) ×3 IMPLANT
PACK BASIN DAY SURGERY FS (CUSTOM PROCEDURE TRAY) ×3 IMPLANT
PENCIL BUTTON HOLSTER BLD 10FT (ELECTRODE) IMPLANT
SET ARTHROSCOPY TUBING (MISCELLANEOUS) ×3
SET ARTHROSCOPY TUBING LN (MISCELLANEOUS) ×1 IMPLANT
SHEET MEDIUM DRAPE 40X70 STRL (DRAPES) ×3 IMPLANT
SLEEVE SCD COMPRESS KNEE MED (MISCELLANEOUS) ×3 IMPLANT
SLING ARM IMMOBILIZER LRG (SOFTGOODS) IMPLANT
SLING ARM IMMOBILIZER MED (SOFTGOODS) IMPLANT
SLING ARM LRG ADULT FOAM STRAP (SOFTGOODS) IMPLANT
SLING ARM MED ADULT FOAM STRAP (SOFTGOODS) IMPLANT
SLING ARM XL FOAM STRAP (SOFTGOODS) IMPLANT
SPONGE LAP 4X18 X RAY DECT (DISPOSABLE) IMPLANT
STRIP CLOSURE SKIN 1/2X4 (GAUZE/BANDAGES/DRESSINGS) ×2 IMPLANT
SUT FIBERWIRE #2 38 T-5 BLUE (SUTURE)
SUT MNCRL AB 4-0 PS2 18 (SUTURE) ×3 IMPLANT
SUT PDS AB 1 CT  36 (SUTURE)
SUT PDS AB 1 CT 36 (SUTURE) IMPLANT
SUT PROLENE 0 CT 1 CR/8 (SUTURE) IMPLANT
SUT TIGER TAPE 7 IN WHITE (SUTURE) IMPLANT
SUT VIC AB 3-0 SH 27 (SUTURE)
SUT VIC AB 3-0 SH 27X BRD (SUTURE) IMPLANT
SUT VICRYL 3-0 CR8 SH (SUTURE) ×3 IMPLANT
SUTURE FIBERWR #2 38 T-5 BLUE (SUTURE) IMPLANT
TAPE FIBER 2MM 7IN #2 BLUE (SUTURE) IMPLANT
TOWEL OR 17X24 6PK STRL BLUE (TOWEL DISPOSABLE) ×3 IMPLANT
TOWEL OR NON WOVEN STRL DISP B (DISPOSABLE) IMPLANT
TUBE CONNECTING 20'X1/4 (TUBING)
TUBE CONNECTING 20X1/4 (TUBING) IMPLANT
WAND STAR VAC 90 (SURGICAL WAND) ×3 IMPLANT
WATER STERILE IRR 1000ML POUR (IV SOLUTION) ×3 IMPLANT

## 2014-05-24 NOTE — Anesthesia Preprocedure Evaluation (Signed)
Anesthesia Evaluation  Patient identified by MRN, date of birth, ID band Patient awake    Reviewed: Allergy & Precautions, H&P , NPO status , Patient's Chart, lab work & pertinent test results  History of Anesthesia Complications (+) PONV  Airway Mallampati: II  Neck ROM: full    Dental   Pulmonary neg pulmonary ROS,          Cardiovascular negative cardio ROS      Neuro/Psych  Headaches, Anxiety Depression    GI/Hepatic GERD-  ,IBS   Endo/Other    Renal/GU      Musculoskeletal  (+) Arthritis -,   Abdominal   Peds  Hematology   Anesthesia Other Findings   Reproductive/Obstetrics                           Anesthesia Physical Anesthesia Plan  ASA: II  Anesthesia Plan: General and Regional   Post-op Pain Management: MAC Combined w/ Regional for Post-op pain   Induction: Intravenous  Airway Management Planned: Oral ETT  Additional Equipment:   Intra-op Plan:   Post-operative Plan: Extubation in OR  Informed Consent: I have reviewed the patients History and Physical, chart, labs and discussed the procedure including the risks, benefits and alternatives for the proposed anesthesia with the patient or authorized representative who has indicated his/her understanding and acceptance.     Plan Discussed with: CRNA, Anesthesiologist and Surgeon  Anesthesia Plan Comments:         Anesthesia Quick Evaluation

## 2014-05-24 NOTE — Discharge Instructions (Signed)
Diet: As you were doing prior to hospitalization  ° °Shower:  May shower but keep the wounds dry, use an occlusive plastic wrap, NO SOAKING IN TUB.  If the bandage gets wet, change with a clean dry gauze. ° °Dressing:  You may change your dressing 3-5 days after surgery.  Then change the dressing daily with sterile gauze dressing.   ° °There are sticky tapes (steri-strips) on your wounds and all the stitches are absorbable.  Leave the steri-strips in place when changing your dressings, they will peel off with time, usually 2-3 weeks. ° °Activity:  Increase activity slowly as tolerated, but follow the weight bearing instructions below.  No lifting or driving for 6 weeks. ° °Weight Bearing:   Sling at all times..   ° °To prevent constipation: you may use a stool softener such as - ° °Colace (over the counter) 100 mg by mouth twice a day  °Drink plenty of fluids (prune juice may be helpful) and high fiber foods °Miralax (over the counter) for constipation as needed.   ° °Itching:  If you experience itching with your medications, try taking only a single pain pill, or even half a pain pill at a time.  You may take up to 10 pain pills per day, and you can also use benadryl over the counter for itching or also to help with sleep.  ° °Precautions:  If you experience chest pain or shortness of breath - call 911 immediately for transfer to the hospital emergency department!! ° °If you develop a fever greater that 101 F, purulent drainage from wound, increased redness or drainage from wound, or calf pain -- Call the office at [phone]                                                °Follow- Up Appointment:  Please call for an appointment to be seen in 2 weeks [place] - [phone] ° ° °Post Anesthesia Home Care Instructions ° °Activity: °Get plenty of rest for the remainder of the day. A responsible adult should stay with you for 24 hours following the procedure.  °For the next 24 hours, DO NOT: °-Drive a  car °-Operate machinery °-Drink alcoholic beverages °-Take any medication unless instructed by your physician °-Make any legal decisions or sign important papers. ° °Meals: °Start with liquid foods such as gelatin or soup. Progress to regular foods as tolerated. Avoid greasy, spicy, heavy foods. If nausea and/or vomiting occur, drink only clear liquids until the nausea and/or vomiting subsides. Call your physician if vomiting continues. ° °Special Instructions/Symptoms: °Your throat may feel dry or sore from the anesthesia or the breathing tube placed in your throat during surgery. If this causes discomfort, gargle with warm salt water. The discomfort should disappear within 24 hours. ° ° °Regional Anesthesia Blocks ° °1. Numbness or the inability to move the "blocked" extremity may last from 3-48 hours after placement. The length of time depends on the medication injected and your individual response to the medication. If the numbness is not going away after 48 hours, call your surgeon. ° °2. The extremity that is blocked will need to be protected until the numbness is gone and the  Strength has returned. Because you cannot feel it, you will need to take extra care to avoid injury. Because it may be weak, you may have difficulty moving   it or using it. You may not know what position it is in without looking at it while the block is in effect. ° °3. For blocks in the legs and feet, returning to weight bearing and walking needs to be done carefully. You will need to wait until the numbness is entirely gone and the strength has returned. You should be able to move your leg and foot normally before you try and bear weight or walk. You will need someone to be with you when you first try to ensure you do not fall and possibly risk injury. ° °4. Bruising and tenderness at the needle site are common side effects and will resolve in a few days. ° °5. Persistent numbness or new problems with movement should be communicated to  the surgeon or the [facility] ([phone])/ [facility] ([phone]). ° ° ° °

## 2014-05-24 NOTE — Transfer of Care (Signed)
Immediate Anesthesia Transfer of Care Note  Patient: Marie Hurley  Procedure(s) Performed: Procedure(s): RIGHT SHOULDER ARTHROSCOPY DEBRIDEMENT LIMITED, DECOMPRESSION SUBACROMIAL PARTIAL ACROMIOPLASTY WITH ROTATOR CUFF REPAIR (Right)  Patient Location: PACU  Anesthesia Type:General  Level of Consciousness: awake, alert  and oriented  Airway & Oxygen Therapy: Patient Spontanous Breathing and Patient connected to face mask oxygen  Post-op Assessment: Report given to PACU RN and Post -op Vital signs reviewed and stable  Post vital signs: Reviewed and stable  Complications: No apparent anesthesia complications

## 2014-05-24 NOTE — Op Note (Signed)
05/24/2014  11:28 AM  PATIENT:  Marie Hurley    PRE-OPERATIVE DIAGNOSIS:  Right shoulder impingement syndrome with full-thickness rotator cuff tear, and biceps tendinosis  POST-OPERATIVE DIAGNOSIS:  Same  PROCEDURE:  RIGHT SHOULDER ARTHROSCOPY DEBRIDEMENT LIMITED, DECOMPRESSION SUBACROMIAL PARTIAL ACROMIOPLASTY WITH ROTATOR CUFF REPAIR and debridement of biceps tendon tear, 15%  SURGEON:  Johnny Bridge, MD  PHYSICIAN ASSISTANT: Joya Gaskins, OPA-C, present and scrubbed throughout the case, critical for completion in a timely fashion, and for retraction, instrumentation, and closure.  Second assistant: Concha Norway, PA Student  ANESTHESIA:   General  PREOPERATIVE INDICATIONS:  [name] is a  48 y.o. female with a diagnosis of right shoulder full-thickness rotator cuff tear that failed conservative measures and elected for surgical management.    The risks benefits and alternatives were discussed with the patient preoperatively including but not limited to the risks of infection, bleeding, nerve injury, cardiopulmonary complications, the need for revision surgery, among others, and the patient was willing to proceed.  OPERATIVE IMPLANTS: Arthrex bio composite 4.75 mm swivel lock x1 with inverted fiber tape posteriorly and an inverted FiberWire anteriorly  OPERATIVE FINDINGS: The glenohumeral articular cartilage is in good condition. The biceps tendon was 15% frayed on the undersurface. There is a little bit of fraying of the subscapularis, but overall in reasonably good condition. The   The supraspinatus had a fairly large tear, although is not quite big enough to warrant 2 anchors.These rotator cuff was delaminated, with a bilaminar type tear, the inferior aspect was fairly retracted, superior aspect was fairly poor quality. It was however adequately mobile for repair back to the tuberosity.  OPERATIVE PROCEDURE: The patient is brought to the operating room and placed in  supine position. General anesthesia was administered. IV antibiotics were given. The right upper extremity is prepped and draped in usual sterile fashion. She was in a semilateral decubitus position with all bony prominences padded. Time out was performed. Diagnostic arthroscopy was carried out the above-named findings. Arthroscopic shaver was used to debride the undersurface of the biceps as well as the upper border of the subscapularis, although this has not really detached off the bone. I went to the subacromial space and performed a complete bursectomy and CA ligament release, as well as acromioplasty with a bur, and performed a light tuboplasty as well. I used a scorpion suture passer and a bird beak to pass the posterior fiber tape sutures, and anterior FiberWire sutures.  And then anchored supraspinatus back to the tuberosity. Excellent fixation of the tendon was achieved.  The arthroscopic instruments were removed, the acromioplasty confirmed from multiple portals, and the portals closed with Monocryl followed by Steri-Strips and sterile gauze. She was awakened and returned to the PACU in stable and satisfactory condition.

## 2014-05-24 NOTE — Progress Notes (Signed)
Assisted Dr. Hodierne with right, ultrasound guided, interscalene  block. Side rails up, monitors on throughout procedure. See vital signs in flow sheet. Tolerated Procedure well. 

## 2014-05-24 NOTE — Anesthesia Procedure Notes (Addendum)
Anesthesia Regional Block:  Interscalene brachial plexus block  Pre-Anesthetic Checklist: ,, timeout performed, Correct Patient, Correct Site, Correct Laterality, Correct Procedure, Correct Position, site marked, Risks and benefits discussed,  Surgical consent,  Pre-op evaluation,  At surgeon's request and post-op pain management  Laterality: Right  Prep: chloraprep       Needles:  Injection technique: Single-shot  Needle Type: Echogenic Stimulator Needle     Needle Length: 5cm 5 cm Needle Gauge: 22 and 22 G    Additional Needles:  Procedures: ultrasound guided (picture in chart) and nerve stimulator Interscalene brachial plexus block  Nerve Stimulator or Paresthesia:  Response: biceps flexion, 0.45 mA,   Additional Responses:   Narrative:  Start time: 05/24/2014 8:40 AM End time: 05/24/2014 8:49 AM Injection made incrementally with aspirations every 5 mL.  Performed by: Personally  Anesthesiologist: Dr Marcie Bal  Additional Notes: Functioning IV was confirmed and monitors were applied.  A 75mm 22ga Arrow echogenic stimulator needle was used. Sterile prep and drape,hand hygiene and sterile gloves were used.  Negative aspiration and negative test dose prior to incremental administration of local anesthetic. The patient tolerated the procedure well.  Ultrasound guidance: relevent anatomy identified, needle position confirmed, local anesthetic spread visualized around nerve(s), vascular puncture avoided.  Image printed for medical record.    Procedure Name: Intubation Performed by: Terrance Mass Pre-anesthesia Checklist: Patient identified, Timeout performed, Emergency Drugs available, Suction available and Patient being monitored Patient Re-evaluated:Patient Re-evaluated prior to inductionOxygen Delivery Method: Circle system utilized Preoxygenation: Pre-oxygenation with 100% oxygen Intubation Type: IV induction Ventilation: Mask ventilation without  difficulty Laryngoscope Size: Miller and 2 Grade View: Grade II Tube type: Oral Number of attempts: 1 Airway Equipment and Method: Stylet Placement Confirmation: ETT inserted through vocal cords under direct vision,  breath sounds checked- equal and bilateral and positive ETCO2 Secured at: 23 cm Tube secured with: Tape Dental Injury: Teeth and Oropharynx as per pre-operative assessment

## 2014-05-24 NOTE — H&P (Signed)
PREOPERATIVE H&P  Chief Complaint: right shoulder pain  HPI: Marie Hurley is a 48 y.o. female who presents for preoperative history and physical with a diagnosis of right rotator cuff tear . Symptoms are rated as moderate to severe, and have been worsening.  This is significantly impairing activities of daily living.  She has elected for surgical management. Failed injections, NSAIDs contraindicated, 2 years of symptoms.  Past Medical History  Diagnosis Date  . Bowel obstruction   . IBS (irritable bowel syndrome)   . GERD (gastroesophageal reflux disease)   . Migraine   . Basal cell carcinoma of chest wall 2015    rIGHT IN THE MIDDLE OF THE PATIENT'S CHEST  . Seasonal allergies   . Arthritis   . Depression   . Anxiety   . PONV (postoperative nausea and vomiting)    Past Surgical History  Procedure Laterality Date  . Tubal ligation    . Carpal tunnel release      rt  . Ercp  2005,2007,2013    several  . Dilation and curettage of uterus  '91 & '96  . Colonoscopy with propofol N/A 02/07/2014    Procedure: COLONOSCOPY WITH PROPOFOL;  Surgeon: Rogene Houston, MD;  Location: [facility];  Service: Endoscopy;  Laterality: N/A;  entered cecum @ 0756 ; total cecal withdrawal time- 9 min  . Diagnostic laparoscopy  2007    exploratory-nic bowel duct  . Cholecystectomy  2002    lap choli  . Colonoscopy     History   Social History  . Marital Status: Married    Spouse Name: N/A    Number of Children: N/A  . Years of Education: N/A   Social History Main Topics  . Smoking status: Never Smoker   . Smokeless tobacco: Never Used  . Alcohol Use: 0.5 oz/week    1 drink(s) per week     Comment: rare  . Drug Use: No  . Sexual Activity: None   Other Topics Concern  . None   Social History Narrative  . None   Family History  Problem Relation Age of Onset  . Hypertension Mother   . Heart disease Mother   . COPD Mother   . Heart disease Father   . Diabetes Father   .  Hypertension Father   . Heart disease Sister   . Healthy Son   . Healthy Daughter    Allergies  Allergen Reactions  . Penicillins Hives  . Promethazine Hcl Nausea And Vomiting   Prior to Admission medications   Medication Sig Start Date End Date Taking? Authorizing Provider  B Complex-C (B-COMPLEX WITH VITAMIN C) tablet Take 1 tablet by mouth daily.   Yes Historical Provider, MD  Calcium Carbonate Antacid (ALKA-SELTZER ANTACID PO) Take 2 tablets by mouth 2 (two) times daily as needed. Upset stomach.   Yes Historical Provider, MD  hyoscyamine (LEVBID) 0.375 MG 12 hr tablet Take 0.375 mg by mouth 3 (three) times daily before meals.  10/25/13  Yes Rogene Houston, MD  Multiple Vitamin (DAILY VITAMIN PO) Take 1 tablet by mouth daily.    Yes Historical Provider, MD  Multiple Vitamins-Minerals (AIRBORNE PO) Take 1 tablet by mouth daily.   Yes Historical Provider, MD  NEXIUM 40 MG capsule TAKE ONE CAPSULE TWICE DAILY 08/09/13  Yes Rogene Houston, MD  clonazePAM (KLONOPIN) 1 MG tablet Take 1 mg by mouth 3 (three) times daily as needed. Anxiety/depression.    Historical Provider, MD  cyclobenzaprine (FLEXERIL)  10 MG tablet Take 10 mg by mouth 3 (three) times daily as needed for muscle spasms (migraines).     Historical Provider, MD  dimenhyDRINATE (DRAMAMINE) 50 MG tablet Take 50 mg by mouth every 8 (eight) hours as needed. Upset stomach.    Historical Provider, MD  fexofenadine (ALLEGRA) 180 MG tablet Take 180 mg by mouth daily.    Historical Provider, MD  HYDROcodone-acetaminophen (NORCO/VICODIN) 5-325 MG per tablet Take 1 tablet by mouth every 6 (six) hours as needed (migraines).    Historical Provider, MD  hydrocortisone-pramoxine [facility]) 2.5-1 % rectal cream Place 1 application rectally as needed for hemorrhoids.  09/16/13   Historical Provider, MD  ibuprofen (ADVIL,MOTRIN) 200 MG tablet Take 400 mg by mouth 2 (two) times daily as needed. Headaches/pain.    Historical Provider, MD  naproxen  sodium (ALEVE) 220 MG tablet Take 440 mg by mouth 2 (two) times daily as needed. Headaches/pain.    Historical Provider, MD  polyethylene glycol powder (GLYCOLAX/MIRALAX) powder Take 255 g (1 Container total) by mouth 2 (two) times daily. 12/04/13   Rogene Houston, MD  SALINE NASAL SPRAY NA Place 1 spray into the nose 4 (four) times daily as needed. Allergies.    Historical Provider, MD     Positive ROS: All other systems have been reviewed and were otherwise negative with the exception of those mentioned in the HPI and as above.  Physical Exam: General: Alert, no acute distress Cardiovascular: No pedal edema Respiratory: No cyanosis, no use of accessory musculature GI: No organomegaly, abdomen is soft and non-tender Skin: No lesions in the area of chief complaint Neurologic: Sensation intact distally Psychiatric: Patient is competent for consent with normal mood and affect Lymphatic: No axillary or cervical lymphadenopathy  MUSCULOSKELETAL: Right shoulder active motion is 0-175 of positive supraspinatus weakness, positive impingement signs.  Assessment: Right shoulder full-thickness supraspinatus tear, leading anterior edge with impingement signs and symptoms.  Plan: Plan for Procedure(s): RIGHT SHOULDER ARTHROSCOPY DEBRIDEMENT LIMITED, DECOMPRESSION SUBACROMIAL PARTIAL ACROMIOPLASTY WITH ROTATOR CUFF REPAIR  The risks benefits and alternatives were discussed with the patient including but not limited to the risks of nonoperative treatment, versus surgical intervention including infection, bleeding, nerve injury,  blood clots, cardiopulmonary complications, morbidity, mortality, among others, and they were willing to proceed.   Johnny Bridge, MD Cell [phone]   05/24/2014 8:00 AM

## 2014-05-27 ENCOUNTER — Ambulatory Visit (HOSPITAL_COMMUNITY): Payer: Self-pay | Admitting: Psychiatry

## 2014-05-29 ENCOUNTER — Encounter (INDEPENDENT_AMBULATORY_CARE_PROVIDER_SITE_OTHER): Payer: Self-pay | Admitting: *Deleted

## 2014-05-29 ENCOUNTER — Other Ambulatory Visit (INDEPENDENT_AMBULATORY_CARE_PROVIDER_SITE_OTHER): Payer: Self-pay | Admitting: *Deleted

## 2014-05-29 DIAGNOSIS — K589 Irritable bowel syndrome without diarrhea: Secondary | ICD-10-CM

## 2014-05-29 DIAGNOSIS — K649 Unspecified hemorrhoids: Secondary | ICD-10-CM

## 2014-05-30 ENCOUNTER — Encounter (HOSPITAL_BASED_OUTPATIENT_CLINIC_OR_DEPARTMENT_OTHER): Payer: Self-pay | Admitting: Orthopedic Surgery

## 2014-05-31 NOTE — Anesthesia Postprocedure Evaluation (Signed)
Anesthesia Post Note  Patient: Marie Hurley  Procedure(s) Performed: Procedure(s) (LRB): RIGHT SHOULDER ARTHROSCOPY DEBRIDEMENT LIMITED, DECOMPRESSION SUBACROMIAL PARTIAL ACROMIOPLASTY WITH ROTATOR CUFF REPAIR (Right)  Anesthesia type: General  Patient location: PACU  Post pain: Pain level controlled and Adequate analgesia  Post assessment: Post-op Vital signs reviewed, Patient's Cardiovascular Status Stable, Respiratory Function Stable, Patent Airway and Pain level controlled  Last Vitals:  Filed Vitals:   05/24/14 1300  BP: 130/66  Pulse: 88  Temp: 36.7 C  Resp: 16    Post vital signs: Reviewed and stable  Level of consciousness: awake, alert  and oriented  Complications: No apparent anesthesia complications

## 2014-07-25 ENCOUNTER — Other Ambulatory Visit (INDEPENDENT_AMBULATORY_CARE_PROVIDER_SITE_OTHER): Payer: Self-pay | Admitting: Internal Medicine

## 2014-07-26 ENCOUNTER — Other Ambulatory Visit (INDEPENDENT_AMBULATORY_CARE_PROVIDER_SITE_OTHER): Payer: Self-pay | Admitting: Internal Medicine

## 2014-08-12 ENCOUNTER — Telehealth (INDEPENDENT_AMBULATORY_CARE_PROVIDER_SITE_OTHER): Payer: Self-pay | Admitting: *Deleted

## 2014-08-12 NOTE — Telephone Encounter (Signed)
Marie Hurley would like to know if we have any samples of Linzess. Her PCP gave her some and she is out. Would also like to know if Dr. Laural Golden thinks it is okay for her to take this medicine. The return phone number is [phone].

## 2014-08-15 NOTE — Telephone Encounter (Signed)
Per Dr.Rehman if this medication is working she can take it and we may give her samples. Patient will be called on 08/16/14.

## 2014-08-16 NOTE — Telephone Encounter (Signed)
Unable to reach the patient. Need to find out what mg she is taking of the Linzess. I have ask that she call and leave [place] a message with that information. Then we will let her know when to come and pick up samples.

## 2014-08-19 ENCOUNTER — Telehealth (INDEPENDENT_AMBULATORY_CARE_PROVIDER_SITE_OTHER): Payer: Self-pay | Admitting: *Deleted

## 2014-08-19 NOTE — Telephone Encounter (Signed)
Requesting samples of linzess 290 if we have any.  This works and the co-pay is high for this medication.

## 2014-08-19 NOTE — Telephone Encounter (Signed)
Call back this morning and says they are 240 mg.  Could not remember if she called [place] or not earlier

## 2014-08-20 NOTE — Telephone Encounter (Signed)
Noted,patient will be called to pick up samples.

## 2014-08-29 ENCOUNTER — Encounter (INDEPENDENT_AMBULATORY_CARE_PROVIDER_SITE_OTHER): Payer: Self-pay | Admitting: *Deleted

## 2014-09-11 ENCOUNTER — Ambulatory Visit: Payer: Self-pay | Admitting: Adult Health

## 2014-09-11 ENCOUNTER — Encounter: Payer: Self-pay | Admitting: Gastroenterology

## 2014-09-11 ENCOUNTER — Telehealth: Payer: Self-pay | Admitting: Gastroenterology

## 2014-09-11 NOTE — Telephone Encounter (Signed)
Patient has [employer] and she can come in to the office without a referral.  Please schedule the patient to see SLF in the office for possible banding.

## 2014-09-11 NOTE — Telephone Encounter (Signed)
Patient called today asking about having [facility] doing a hemorrhoid banding/surgery on her. She is an est patient with N[facility]. Does patient need a referral and can she have a banding in the office or since she has both internal and external does she need to schedule at the hospital? She would also if possible have this done before the end of the year. Please advise. [phone] (patient is having bleeding also)

## 2014-09-11 NOTE — Telephone Encounter (Signed)
DO YOU WANT HER IN AN URGENT SLOT OR RECALL FOR January

## 2014-09-11 NOTE — Telephone Encounter (Signed)
APPT MADE AND LETTER SENT AND MESSAGE LEFT ON PHONE

## 2014-09-16 ENCOUNTER — Ambulatory Visit: Payer: Self-pay | Admitting: Adult Health

## 2014-10-23 ENCOUNTER — Ambulatory Visit: Payer: Self-pay | Admitting: Gastroenterology

## 2014-10-28 ENCOUNTER — Other Ambulatory Visit (INDEPENDENT_AMBULATORY_CARE_PROVIDER_SITE_OTHER): Payer: Self-pay | Admitting: Internal Medicine

## 2014-11-14 ENCOUNTER — Ambulatory Visit (INDEPENDENT_AMBULATORY_CARE_PROVIDER_SITE_OTHER): Payer: Self-pay | Admitting: Internal Medicine

## 2014-11-19 ENCOUNTER — Telehealth (INDEPENDENT_AMBULATORY_CARE_PROVIDER_SITE_OTHER): Payer: Self-pay | Admitting: *Deleted

## 2014-11-19 DIAGNOSIS — R109 Unspecified abdominal pain: Secondary | ICD-10-CM

## 2014-11-19 DIAGNOSIS — K5909 Other constipation: Secondary | ICD-10-CM

## 2014-11-19 DIAGNOSIS — R112 Nausea with vomiting, unspecified: Secondary | ICD-10-CM

## 2014-11-19 NOTE — Telephone Encounter (Signed)
Marie Hurley would like to speak with [name] about some stomach issues she is having. She may have another blockage. Her return phone number is [phone].

## 2014-11-19 NOTE — Telephone Encounter (Signed)
Patient called. This has been going on for 4 months , sporadic stomach pain to the point that it doubles her over. Bowel Movements are liquid when she does have them. Last BM was @ 3 am this morning. Patient states that when she eats it only goes so far , then it stops,like something is blocking it from going down. She become Nauseated , burps with a large amount of vomit coming up into her mouth. Again patient states that she has to get into a fetal position until the pain gets better. She says that she ate Chicken on last Friday,then she started this burping.Does not know if this is the reason why.  She states that at the time of her last ERCP she was told that she would have to live with this. Per patient they were to have put in stents but did not, she questions if she needs to have the stents put in?  Marie Hurley feels that she has a blockage,pain is extreme. What can be done.  She takes Miralax 17 grams twice daily.On days that she ,per patient, takes a Linzess she will only take 1 capful of Linzess. This will be discussed with Dr.Rehman. Please refer to recent note.

## 2014-11-19 NOTE — Telephone Encounter (Signed)
Per Dr.Rehman the patient needs to have CBC/Diff, LFT.Then she will need to have a office visit. Patient may be a candidate for Viberzi. Patient called and made aware.

## 2014-11-20 LAB — CBC WITH DIFFERENTIAL/PLATELET
BASOS PCT: 0 % (ref 0–1)
Basophils Absolute: 0 10*3/uL (ref 0.0–0.1)
Eosinophils Absolute: 0.1 10*3/uL (ref 0.0–0.7)
Eosinophils Relative: 1 % (ref 0–5)
HCT: 34.8 % — ABNORMAL LOW (ref 36.0–46.0)
Hemoglobin: 11.3 g/dL — ABNORMAL LOW (ref 12.0–15.0)
Lymphocytes Relative: 41 % (ref 12–46)
Lymphs Abs: 3.7 10*3/uL (ref 0.7–4.0)
MCH: 28.3 pg (ref 26.0–34.0)
MCHC: 32.5 g/dL (ref 30.0–36.0)
MCV: 87 fL (ref 78.0–100.0)
MPV: 9.5 fL (ref 8.6–12.4)
Monocytes Absolute: 0.7 10*3/uL (ref 0.1–1.0)
Monocytes Relative: 8 % (ref 3–12)
NEUTROS ABS: 4.5 10*3/uL (ref 1.7–7.7)
NEUTROS PCT: 50 % (ref 43–77)
PLATELETS: 330 10*3/uL (ref 150–400)
RBC: 4 MIL/uL (ref 3.87–5.11)
RDW: 15.7 % — ABNORMAL HIGH (ref 11.5–15.5)
WBC: 9 10*3/uL (ref 4.0–10.5)

## 2014-11-20 LAB — HEPATIC FUNCTION PANEL
ALBUMIN: 4.1 g/dL (ref 3.5–5.2)
ALT: 12 U/L (ref 0–35)
AST: 15 U/L (ref 0–37)
Alkaline Phosphatase: 43 U/L (ref 39–117)
BILIRUBIN DIRECT: 0.1 mg/dL (ref 0.0–0.3)
Indirect Bilirubin: 0.5 mg/dL (ref 0.2–1.2)
Total Bilirubin: 0.6 mg/dL (ref 0.2–1.2)
Total Protein: 7.1 g/dL (ref 6.0–8.3)

## 2014-11-20 NOTE — Telephone Encounter (Signed)
Per Dr.Rehman the patient's labs are normal. May repeat LFT's and do Lipase , Amylase. Patient was called and made aware. Labs were faxed to [facility].

## 2014-11-20 NOTE — Telephone Encounter (Signed)
Marie Hurley would like to speak with [name]. She is wanting to get her test results from yesterday and if they are negative, she can have them redone today. She has had another episode. The return phone number is [phone].

## 2014-11-20 NOTE — Telephone Encounter (Signed)
Per Dr.Rehman labs are normal. May repeat LFT's , and also do Lipase, Amylase. Patient called and made aware.

## 2014-12-03 ENCOUNTER — Other Ambulatory Visit (INDEPENDENT_AMBULATORY_CARE_PROVIDER_SITE_OTHER): Payer: Self-pay | Admitting: Internal Medicine

## 2014-12-03 ENCOUNTER — Telehealth (INDEPENDENT_AMBULATORY_CARE_PROVIDER_SITE_OTHER): Payer: Self-pay | Admitting: *Deleted

## 2014-12-03 DIAGNOSIS — R112 Nausea with vomiting, unspecified: Secondary | ICD-10-CM

## 2014-12-03 DIAGNOSIS — R109 Unspecified abdominal pain: Secondary | ICD-10-CM

## 2014-12-03 NOTE — Telephone Encounter (Signed)
Marie Hurley - per Dr.Rehman , patient needs to have a Upper GI series , and Small Bowel X-Ray.  DX: N&V, Abdominal Pain

## 2014-12-03 NOTE — Telephone Encounter (Signed)
UGI w/ SBFT sch'd 12/05/14 at 1145, npo after midnight

## 2014-12-05 ENCOUNTER — Other Ambulatory Visit (HOSPITAL_COMMUNITY): Payer: Self-pay

## 2014-12-12 ENCOUNTER — Other Ambulatory Visit (INDEPENDENT_AMBULATORY_CARE_PROVIDER_SITE_OTHER): Payer: Self-pay | Admitting: Internal Medicine

## 2014-12-23 ENCOUNTER — Ambulatory Visit (INDEPENDENT_AMBULATORY_CARE_PROVIDER_SITE_OTHER): Payer: Self-pay | Admitting: Internal Medicine

## 2014-12-23 ENCOUNTER — Other Ambulatory Visit (HOSPITAL_COMMUNITY): Payer: Self-pay

## 2014-12-30 IMAGING — RF DG FLUORO GUIDE NDL PLC/BX
1 series · 1 of 1 positions shown · IV contrast (multihance)
Comparison: none

CLINICAL DATA: Shoulder pain

EXAM:
RIGHT SHOULDER INJECTION UNDER FLUOROSCOPY
TECHNIQUE: An appropriate skin entrance site was determined. The site was
marked, prepped with Betadine, draped in the usual sterile fashion,
and infiltrated locally with buffered Lidocaine. 22 gauge spinal
needle was advanced to the superomedial margin of the humeral head
under intermittent fluoroscopy. 1 ml of Lidocaine injected easily. A
mixture of 0.01 ml Multihance 10 ml of Omnipaque 300 and 10 mL
sterile saline, was then used to opacify the right shoulder capsule.
A total of 12 mL of the diluted MultiHance solution was injected
into the joint. No immediate complication.
FLUOROSCOPY TIME:  Thirty-second

[Series 1: run · 1 of 1 slices shown]
[im 1/1]
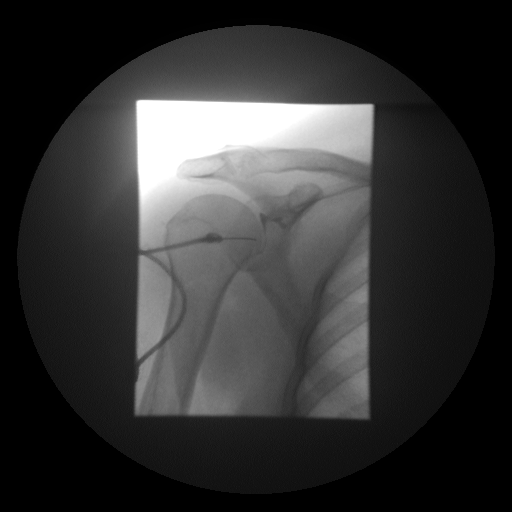

[1 of 1 positions shown; findings below may reference images not displayed]

FINDINGS: Intra-articular contrast is present.
IMPRESSION: Technically successful right shoulder injection for MRI.

## 2015-01-13 ENCOUNTER — Other Ambulatory Visit (HOSPITAL_COMMUNITY): Payer: Self-pay | Admitting: Internal Medicine

## 2015-01-15 ENCOUNTER — Other Ambulatory Visit (HOSPITAL_COMMUNITY): Payer: Self-pay | Admitting: Internal Medicine

## 2015-01-15 DIAGNOSIS — Z1231 Encounter for screening mammogram for malignant neoplasm of breast: Secondary | ICD-10-CM

## 2015-01-29 ENCOUNTER — Ambulatory Visit (HOSPITAL_COMMUNITY): Payer: Self-pay

## 2015-03-25 ENCOUNTER — Other Ambulatory Visit (INDEPENDENT_AMBULATORY_CARE_PROVIDER_SITE_OTHER): Payer: Self-pay | Admitting: Internal Medicine

## 2015-04-15 ENCOUNTER — Other Ambulatory Visit (HOSPITAL_COMMUNITY): Payer: Self-pay | Admitting: Internal Medicine

## 2015-04-15 DIAGNOSIS — Z1231 Encounter for screening mammogram for malignant neoplasm of breast: Secondary | ICD-10-CM

## 2015-05-06 ENCOUNTER — Ambulatory Visit (INDEPENDENT_AMBULATORY_CARE_PROVIDER_SITE_OTHER): Payer: Self-pay | Admitting: Internal Medicine

## 2015-05-07 ENCOUNTER — Ambulatory Visit (HOSPITAL_COMMUNITY): Payer: Self-pay

## 2015-05-14 ENCOUNTER — Ambulatory Visit: Payer: Self-pay | Admitting: Obstetrics and Gynecology

## 2015-05-16 ENCOUNTER — Ambulatory Visit: Payer: Self-pay | Admitting: Obstetrics and Gynecology

## 2015-05-20 ENCOUNTER — Encounter: Payer: Self-pay | Admitting: Obstetrics and Gynecology

## 2015-05-20 ENCOUNTER — Ambulatory Visit (INDEPENDENT_AMBULATORY_CARE_PROVIDER_SITE_OTHER): Payer: BLUE CROSS/BLUE SHIELD | Admitting: Obstetrics and Gynecology

## 2015-05-20 VITALS — BP 120/76 | Ht 63.0 in | Wt 124.0 lb

## 2015-05-20 DIAGNOSIS — N816 Rectocele: Secondary | ICD-10-CM

## 2015-05-20 DIAGNOSIS — K648 Other hemorrhoids: Secondary | ICD-10-CM

## 2015-05-20 DIAGNOSIS — N8110 Cystocele, unspecified: Secondary | ICD-10-CM | POA: Diagnosis not present

## 2015-05-20 DIAGNOSIS — N8189 Other female genital prolapse: Secondary | ICD-10-CM | POA: Diagnosis not present

## 2015-05-20 NOTE — Progress Notes (Signed)
Patient ID: Marie Hurley, female   DOB: 1966-04-06, 49 y.o.   MRN: [id number]   [facility] Visit  Patient name: Marie Hurley MRN [id number]  Date of birth: Nov 16, 1965  CC & HPI:  Marie Hurley is a 49 y.o. female presenting today for followup  Of pelvic relaxation with rectocele, mild cystocele as well as severe hemorrhoids ,prolapsing internal hemorrhoids  ROS:  Pt has to disempact herself on occasion.  Pertinent History Reviewed:   Reviewed: Significant for cervix palpable at introitus. Medical         Past Medical History  Diagnosis Date  . Bowel obstruction   . IBS (irritable bowel syndrome)   . GERD (gastroesophageal reflux disease)   . Migraine   . Basal cell carcinoma of chest wall 2015    rIGHT IN THE MIDDLE OF THE PATIENT'S CHEST  . Seasonal allergies   . Arthritis   . Depression   . Anxiety   . PONV (postoperative nausea and vomiting)   . Hx MRSA infection 2008    multiple skin lesions  . Complete rotator cuff tear 05/24/2014                              Surgical Hx:    Past Surgical History  Procedure Laterality Date  . Tubal ligation    . Carpal tunnel release      rt  . Ercp  2005,2007,2013    several  . Dilation and curettage of uterus  '91 & '96  . Colonoscopy with propofol N/A 02/07/2014    Procedure: COLONOSCOPY WITH PROPOFOL;  Surgeon: Rogene Houston, MD;  Location: [facility];  Service: Endoscopy;  Laterality: N/A;  entered cecum @ 0756 ; total cecal withdrawal time- 9 min  . Diagnostic laparoscopy  2007    exploratory-nic bowel duct  . Cholecystectomy  2002    lap choli  . Colonoscopy    . Shoulder arthroscopy with subacromial decompression, rotator cuff repair and bicep tendon repair Right 05/24/2014    Procedure: RIGHT SHOULDER ARTHROSCOPY DEBRIDEMENT LIMITED, DECOMPRESSION SUBACROMIAL PARTIAL ACROMIOPLASTY WITH ROTATOR CUFF REPAIR;  Surgeon: Johnny Bridge, MD;  Location: [facility];  Service: Orthopedics;  Laterality:  Right;   Medications: Reviewed & Updated - see associated section                       Current outpatient prescriptions:  .  B Complex-C (B-COMPLEX WITH VITAMIN C) tablet, Take 1 tablet by mouth daily., Disp: , Rfl:  .  citalopram (CELEXA) 20 MG tablet, Take 20 mg by mouth daily., Disp: , Rfl:  .  clonazePAM (KLONOPIN) 1 MG tablet, Take 1 mg by mouth 3 (three) times daily as needed. Anxiety/depression., Disp: , Rfl:  .  dimenhyDRINATE (DRAMAMINE) 50 MG tablet, Take 50 mg by mouth every 8 (eight) hours as needed. Upset stomach., Disp: , Rfl:  .  fexofenadine (ALLEGRA) 180 MG tablet, Take 180 mg by mouth daily., Disp: , Rfl:  .  HYDROcodone-acetaminophen (NORCO/VICODIN) 5-325 MG per tablet, Take 1 tablet by mouth every 6 (six) hours as needed for moderate pain., Disp: , Rfl:  .  hydrocortisone-pramoxine (ANALPRAM-HC) 2.5-1 % rectal cream, Place 1 application rectally as needed for hemorrhoids. , Disp: , Rfl:  .  hyoscyamine (LEVBID) 0.375 MG 12 hr tablet, TAKE ONE TABLET BY MOUTH THREE TIMES DAILY BEFORE MEALS, Disp: 90 tablet, Rfl: 3 .  Multiple Vitamin (DAILY VITAMIN PO), Take 1 tablet by mouth daily. , Disp: , Rfl:  .  NEXIUM 40 MG capsule, TAKE ONE CAPSULE TWICE DAILY, Disp: 60 capsule, Rfl: 5 .  ondansetron (ZOFRAN) 4 MG tablet, Take 1 tablet (4 mg total) by mouth every 8 (eight) hours as needed for nausea or vomiting., Disp: 30 tablet, Rfl: 0 .  polyethylene glycol powder (GLYCOLAX/MIRALAX) powder, TAKE 34 GRAMS (TWO CAPFULS) IN 8 OZ OF FLUID TWICE DAILY, Disp: 2108 g, Rfl: 5 .  SALINE NASAL SPRAY NA, Place 1 spray into the nose 4 (four) times daily as needed. Allergies., Disp: , Rfl:    Social History: Reviewed -  reports that she has never smoked. She has never used smokeless tobacco.  Objective Findings:  Vitals: Blood pressure 120/76, height 5\' 3"  (1.6 m), weight 124 lb (56.246 kg), last menstrual period 05/09/2015.  Physical Examination: General appearance - alert, well appearing,  and in no distress, oriented to person, place, and time and normal appearing weight Mental status - alert, oriented to person, place, and time, normal mood, behavior, speech, dress, motor activity, and thought processes Abdomen - soft, nontender, nondistended, no masses or organomegaly Pelvic - VULVA: normal appearing vulva with no masses, tenderness or lesions, marked hemorrhoids, VAGINA: normal appearing vagina with normal color and discharge, no lesions, PELVIC FLOOR EXAM: rectocele marked to over 90 degree, uterine descensus grade 2 to just inside introitus, CERVIX: normal appearing cervix without discharge or lesions, UTERUS: uterus is normal size, shape, consistency and nontender, ADNEXA: normal adnexa in size, nontender and no masses, RECTAL: declined by the patient, internal hemorrhoids, pt seeing Dr Oneida Alar next wk.   Assessment & Plan:   A:  1. 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 Internal and external hemorrhoids 3 chronic constipation, worsened by rectocele  P:  1. Pt to keep her prevously scheduled appt with Dr Oneida Alar. 2. Pt to get u/s to check out uterus and adnexa 3. Will need preop Endometrial biopsy 4. Pt is a candidate for vag hyst, ant and post repair.       We will discuss whether to do bso

## 2015-05-20 NOTE — Progress Notes (Signed)
Patient ID: Marie Hurley, female   DOB: 1966/10/11, 48 y.o.   MRN: [id number] Pt here today for GYN issue. Pt states that she feels something"up inside her she has never felt before". Pt states that she has noticed the area for a couple months.

## 2015-05-27 ENCOUNTER — Telehealth: Payer: Self-pay | Admitting: Obstetrics and Gynecology

## 2015-05-27 ENCOUNTER — Other Ambulatory Visit: Payer: Self-pay | Admitting: Obstetrics and Gynecology

## 2015-05-27 ENCOUNTER — Ambulatory Visit (INDEPENDENT_AMBULATORY_CARE_PROVIDER_SITE_OTHER): Payer: BLUE CROSS/BLUE SHIELD

## 2015-05-27 ENCOUNTER — Ambulatory Visit (INDEPENDENT_AMBULATORY_CARE_PROVIDER_SITE_OTHER): Payer: BLUE CROSS/BLUE SHIELD | Admitting: Obstetrics and Gynecology

## 2015-05-27 ENCOUNTER — Encounter: Payer: Self-pay | Admitting: Obstetrics and Gynecology

## 2015-05-27 VITALS — BP 110/60 | Ht 63.0 in | Wt 120.5 lb

## 2015-05-27 DIAGNOSIS — N8189 Other female genital prolapse: Secondary | ICD-10-CM

## 2015-05-27 DIAGNOSIS — N812 Incomplete uterovaginal prolapse: Secondary | ICD-10-CM | POA: Insufficient documentation

## 2015-05-27 DIAGNOSIS — N814 Uterovaginal prolapse, unspecified: Secondary | ICD-10-CM | POA: Diagnosis not present

## 2015-05-27 DIAGNOSIS — N816 Rectocele: Secondary | ICD-10-CM

## 2015-05-27 NOTE — Progress Notes (Signed)
[place] retroverted uterus w a 1.1 x .9 x .9cm fundal fibroid,EEC 66mm, normal rt ov,lt ov contains two dominate follicles (#1) 2 x 2 x 1.2 cm complex follicle and a 2.2 x 1.5 x 2.0 simple follicle.

## 2015-05-27 NOTE — Progress Notes (Signed)
Patient ID: [name], female   DOB: 1966-03-12, 49 y.o.   MRN: [id number] Pt here today for results. Pt had [place] and then to see Dr. Glo Herring.   [facility] Visit  Patient name: Marie Hurley MRN [id number]  Date of birth: 13-Apr-1966  CC & HPI:  Marie Hurley is a 49 y.o. 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 U/s reviewed.  ROS:  Splints to defecate. Chronic constipation, has had to disimpact self , chronic hemorrhoids.  Pertinent History Reviewed:   Reviewed: Significant for  Minimal sui Medical         Past Medical History  Diagnosis Date  . Bowel obstruction   . IBS (irritable bowel syndrome)   . GERD (gastroesophageal reflux disease)   . Migraine   . Basal cell carcinoma of chest wall 2015    rIGHT IN THE MIDDLE OF THE PATIENT'S CHEST  . Seasonal allergies   . Arthritis   . Depression   . Anxiety   . PONV (postoperative nausea and vomiting)   . Hx MRSA infection 2008    multiple skin lesions  . Complete rotator cuff tear 05/24/2014                              Surgical Hx:    Past Surgical History  Procedure Laterality Date  . Tubal ligation    . Carpal tunnel release      rt  . Ercp  2005,2007,2013    several  . Dilation and curettage of uterus  '91 & '96  . Colonoscopy with propofol N/A 02/07/2014    Procedure: COLONOSCOPY WITH PROPOFOL;  Surgeon: Rogene Houston, MD;  Location: [facility];  Service: Endoscopy;  Laterality: N/A;  entered cecum @ 0756 ; total cecal withdrawal time- 9 min  . Diagnostic laparoscopy  2007    exploratory-nic bowel duct  . Cholecystectomy  2002    lap choli  . Colonoscopy    . Shoulder arthroscopy with subacromial decompression, rotator cuff repair and bicep tendon repair Right 05/24/2014    Procedure: RIGHT SHOULDER ARTHROSCOPY DEBRIDEMENT LIMITED, DECOMPRESSION SUBACROMIAL PARTIAL ACROMIOPLASTY WITH ROTATOR CUFF REPAIR;  Surgeon: Johnny Bridge, MD;  Location: [facility];  Service: Orthopedics;  Laterality: Right;   Medications: Reviewed & Updated - see associated section                       Current outpatient prescriptions:  .  B Complex-C (B-COMPLEX WITH VITAMIN C) tablet, Take 1 tablet by mouth daily., Disp: , Rfl:  .  citalopram (CELEXA) 20 MG tablet, Take 20 mg by mouth daily., Disp: , Rfl:  .  clonazePAM (KLONOPIN) 1 MG tablet, Take 1 mg by mouth 3 (three) times daily as needed. Anxiety/depression., Disp: , Rfl:  .  dimenhyDRINATE (DRAMAMINE) 50 MG tablet, Take 50 mg by mouth every 8 (eight) hours as needed. Upset stomach., Disp: , Rfl:  .  fexofenadine (ALLEGRA) 180 MG tablet, Take 180 mg by mouth daily., Disp: , Rfl:  .  HYDROcodone-acetaminophen (NORCO/VICODIN) 5-325 MG per tablet, Take 1 tablet by mouth every 6 (six) hours as needed for moderate pain., Disp: , Rfl:  .  hydrocortisone-pramoxine (ANALPRAM-HC) 2.5-1 % rectal cream, Place 1 application rectally as needed for hemorrhoids. , Disp: , Rfl:  .  hyoscyamine (LEVBID) 0.375 MG  12 hr tablet, TAKE ONE TABLET BY MOUTH THREE TIMES DAILY BEFORE MEALS, Disp: 90 tablet, Rfl: 3 .  Multiple Vitamin (DAILY VITAMIN PO), Take 1 tablet by mouth daily. , Disp: , Rfl:  .  NEXIUM 40 MG capsule, TAKE ONE CAPSULE TWICE DAILY, Disp: 60 capsule, Rfl: 5 .  ondansetron (ZOFRAN) 4 MG tablet, Take 1 tablet (4 mg total) by mouth every 8 (eight) hours as needed for nausea or vomiting., Disp: 30 tablet, Rfl: 0 .  polyethylene glycol powder (GLYCOLAX/MIRALAX) powder, TAKE 34 GRAMS (TWO CAPFULS) IN 8 OZ OF FLUID TWICE DAILY, Disp: 2108 g, Rfl: 5 .  SALINE NASAL SPRAY NA, Place 1 spray into the nose 4 (four) times daily as needed. Allergies., Disp: , Rfl:    Social History: Reviewed -  reports that she has never smoked. She has never used smokeless tobacco.  Objective Findings:  Vitals: Blood pressure 110/60, height 5\' 3"  (1.6 m), weight 120 lb 8 oz (54.658 kg), last  menstrual period 05/09/2015.  Physical Examination: General appearance - alert, well appearing, and in no distress, oriented to person, place, and time and normal appearing weight Mental status - alert, oriented to person, place, and time, normal mood, behavior, speech, dress, motor activity, and thought processes Eyes - pupils equal and reactive, extraocular eye movements intact Abdomen - soft, nontender, nondistended, no masses or organomegaly Pelvic - VULVA: normal appearing vulva with no masses, tenderness or lesions, VAGINA: normal appearing vagina with normal color and discharge, no lesions, PELVIC FLOOR EXAM: rectocele to 90 degree, cystocele mild, uterine descensus grade 2, CERVIX: normal appearing cervix without discharge or lesions, UTERUS: uterus is normal size, shape, consistency and nontender, ADNEXA: normal adnexa in size, nontender and no masses, RECTAL: normal rectal, no masses, rectocele noted to > 90degrees, ,  Musculoskeletal - no joint tenderness, deformity or swelling, no muscular tenderness noted Extremities - peripheral pulses normal, no pedal edema, no clubbing or cyanosis   Assessment & Plan:     Endometrial Biopsy: lmp 13 July Patient given informed consent, signed copy in the chart, time out was performed. Time out taken. The patient was placed in the lithotomy position and the cervix brought into view with sterile speculum.  Portio of cervix cleansed x 2 with betadine swabs.  A tenaculum was placed in the anterior lip of the cervix. The uterus was sounded for depth of 9 cm,. Marie Hurley 3 mm was introduced to into the uterus, suction created,  and an endometrial sample was obtained. All equipment was removed and accounted for.   The patient tolerated the procedure well.    Patient given post procedure instructions.   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 Pt is a candidate for Vag Hyst , Ant and post repair  P:  1. Await  path report for final decision for surgery  Followup: by phone, will schedule Vag hysterectomy, anterior and posterior repair for 30 August.

## 2015-05-28 ENCOUNTER — Other Ambulatory Visit: Payer: Self-pay | Admitting: Obstetrics and Gynecology

## 2015-05-29 ENCOUNTER — Ambulatory Visit: Payer: 59 | Admitting: Gastroenterology

## 2015-06-03 ENCOUNTER — Telehealth: Payer: Self-pay | Admitting: Obstetrics and Gynecology

## 2015-06-03 ENCOUNTER — Telehealth (INDEPENDENT_AMBULATORY_CARE_PROVIDER_SITE_OTHER): Payer: Self-pay | Admitting: *Deleted

## 2015-06-03 NOTE — Telephone Encounter (Signed)
Marie Hurley states that she is to have a Vaginal hysterectomy, Rectocele , Cystocele on 06-10-15. He has had horrible constipation , at times it is impacted. She wants to see if Dr. Laural Golden thinks this is okay for her to have,does he think it would complicate them. Didn't know if he would want to see her prior to surgery or would please reach out to Dr.Ferguson.

## 2015-06-03 NOTE — Telephone Encounter (Signed)
Marie Hurley would like to speak with [name]. She is having a lot of trouble with her stomach and is scheduled for a hysterectomy next week. The return phone number is [phone].

## 2015-06-03 NOTE — Telephone Encounter (Signed)
Can be normal per Ferg, and if bleeding has not stopped by next week call back.

## 2015-06-03 NOTE — Telephone Encounter (Signed)
CHRYSTAL LEFT MESSAGE W/ NO RETURN CALL

## 2015-06-04 NOTE — Patient Instructions (Signed)
TAMILA GAULIN  06/04/2015     @PREFPERIOPPHARMACY @   Your procedure is scheduled on 06/10/2015  Report to [facility] at  615  A.M.  Call this number if you have problems the morning of surgery:  [phone]   Remember:  Do not eat food or drink liquids after midnight.  Take these medicines the morning of surgery with A SIP OF WATER celexa, klonopin,allegra, norco, nexium, zofran.   Do not wear jewelry, make-up or nail polish.  Do not wear lotions, powders, or perfumes.    Do not shave 48 hours prior to surgery.  Men may shave face and neck.  Do not bring valuables to the hospital.  [facility] is not responsible for any belongings or valuables.  Contacts, dentures or bridgework may not be worn into surgery.  Leave your suitcase in the car.  After surgery it may be brought to your room.  For patients admitted to the hospital, discharge time will be determined by your treatment team.  Patients discharged the day of surgery will not be allowed to drive home.   Name and phone number of your driver:   family Special instructions:  See the Instructions about your bowel prep, enclosed.  Please read over the following fact sheets that you were given. Pain Booklet, Coughing and Deep Breathing, Surgical Site Infection Prevention, Anesthesia Post-op Instructions and Care and Recovery After Surgery      About Rectocele  Overview  A rectocele is a type of hernia which causes different degrees of bulging of the rectal tissues into the vaginal wall.  You may even notice that it presses against the vaginal wall so much that some vaginal tissues droop outside of the opening of your vagina.  Causes of Rectocele  The most common cause is childbirth.  The muscles and ligaments in the pelvis that hold up and support the female organs and vagina become stretched and weakened during labor and delivery.  The more babies you have, the more the support tissues are stretched and  weakened.  Not everyone who has a baby will develop a rectocele.  Some women have stronger supporting tissue in the pelvis and may not have as much of a problem as others.  Women who have a Cesarean section usually do not get rectocele's unless they pushed a long time prior to the cesarean delivery.  Other conditions that can cause a rectocele include chronic constipation, a chronic cough, a lot of heavy lifting, and obesity.  Older women may have this problem because the loss of female hormones causes the vaginal tissue to become weaker.  Symptoms  There may not be any symptoms.  If you do have symptoms, they may include:  Pelvic pressure in the rectal area  Protrusion of the lower part of the vagina through the opening of the vagina  Constipation and trapping of the stool, making it difficult to have a bowel movement.  In severe cases, you may have to press on the lower part of your vagina to help push the stool out of you rectum.  This is called splinting to empty.  Diagnosing Rectocele  Your health care provider will ask about your symptoms and perform a pelvic exam.  S/he will ask you to bear down, pushing like you are having a bowel movement so as to see how far the lower part of the vagina protrudes into the vagina and possible outside of the vagina.  Your provider will also ask you to contract the muscles of your pelvis (like you are stopping the stream in the middle of urinating) to determine the strength of your pelvic muscles.  Your provider may also do a rectal exam.  Treatment Options  If you do not have any symptoms, no treatment may be necessary.  Other treatment options include:  Pelvic floor exercises: Contracting the muscles in your genital area may help strengthen your muscles and support the organs.  Be sure to get proper exercise instruction from you physical therapist.  A pessary (removealbe pelvic support device) sometimes helps rectocele symptoms.  Surgery: Surgical  repair may be necessary. In some cases the uterus may need to be taken out ( a hysterectomy) as well.  There are many types of surgery for pelvic support problems.  Look for physicians who specialize in repair procedures.  You can take care of yourself by:  Treating and preventing constipation  Avoiding heavy lifting, and lifting correctly (with your legs, not with you waist or back)  Treating a chronic cough or bronchitis  Not smoking  avoiding too much weight gain  Doing pelvic floor exercises   2007, [employer] Doc.33Cystocele Repair Cystocele repair is surgery to remove a cystocele, which is a bulging, drooping area (hernia) of the bladder that extends into the vagina. This bulging occurs on the top front wall of the vagina. LET [facility] CARE PROVIDER KNOW ABOUT:  4. Any allergies you have. 5. All medicines you are taking, including vitamins, herbs, eye drops, creams, and over-the-counter medicines. 6. Use of steroids (by mouth or creams). 7. Previous problems you or members of your family have had with the use of anesthetics. 8. Any blood disorders you have. 9. Previous surgeries you have had. 10. Medical conditions you have. 11. Possibility of pregnancy, if this applies. RISKS AND COMPLICATIONS  Generally, this is a safe procedure. However, as with any procedure, complications can occur. Possible complications include:  Excessive bleeding.  Infection.  Injury to surrounding structures.  Problems related to anesthetics. The risks will vary depending on the type of anesthetic given.  Problems with the urinary catheter after surgery, such as blockage.  Return of the cystocele. BEFORE THE PROCEDURE   Ask your health care provider about changing or stopping your regular medicines. You may need to stop taking certain medicines 1 week before surgery.  Do not eat or drink anything after midnight the night before surgery.  If you smoke, do not smoke for  at least 2 weeks before the surgery.  Do not drink any alcohol for 3 days before the surgery.  Arrange for someone to drive you home after your hospital stay and to help you with activities during recovery. PROCEDURE  4. You will be given a medicine that makes you sleep through the procedure (general anesthetic) or a medicine injected into your spine that numbs your body below the waist (spinal or epidural anesthetic). You will be asleep or be numbed through the entire procedure. 5. A thin, flexible tube (Foley catheter) will be placed in your bladder to drain urine during and after the surgery. 6. The surgery is performed through the vagina. The front wall of the vagina is opened up, and the muscle between the bladder and vagina is pulled up to its normal position. This is reinforced with stitches or a piece of mesh. This removes the hernia so that the top of the vagina does not fall into the opening of the vagina. 7.  The cut on the front wall of the vagina is then closed with absorbable stitches that do not need to be removed. AFTER THE PROCEDURE  7. You will be taken to a recovery area where your progress will be closely watched. Your breathing, blood pressure, and pulse (vital signs) will be checked often. When you are stable, you will be taken to a regular hospital room. 8. You will have a catheter in place to drain your bladder. This will stay in place for 2-7 days or until your bladder is working properly on its own. 9. You may have gauze packing in the vagina. This will be removed 1-2 days after the surgery. 10. You will be given pain medicine as needed and may be given a medicine that kills germs (antibiotic). 11. You will likely need to stay in the hospital for 1-2 days. Document Released: 09/24/2000 Document Revised: 05/30/2013 Document Reviewed: 03/16/2013 [facility] Patient Information 2015 [place], [place]. This information is not intended to replace advice given to you by your health  care provider. Make sure you discuss any questions you have with your health care provider. Hysterectomy Information  A hysterectomy is a surgery in which your uterus is removed. This surgery may be done to treat various medical problems. After the surgery, you will no longer have menstrual periods. The surgery will also make you unable to become pregnant (sterile). The fallopian tubes and ovaries can be removed (bilateral salpingo-oophorectomy) during this surgery as well.  REASONS FOR A HYSTERECTOMY 12. Persistent, abnormal bleeding. 13. Lasting (chronic) pelvic pain or infection. 14. The lining of the uterus (endometrium) starts growing outside the uterus (endometriosis). 15. The endometrium starts growing in the muscle of the uterus (adenomyosis). 16. The uterus falls down into the vagina (pelvic organ prolapse). 17. Noncancerous growths in the uterus (uterine fibroids) that cause symptoms. 18. Precancerous cells. 19. Cervical cancer or uterine cancer. TYPES OF HYSTERECTOMIES  Supracervical hysterectomy--In this type, the top part of the uterus is removed, but not the cervix.  Total hysterectomy--The uterus and cervix are removed.  Radical hysterectomy--The uterus, the cervix, and the fibrous tissue that holds the uterus in place in the pelvis (parametrium) are removed. WAYS A HYSTERECTOMY CAN BE PERFORMED  Abdominal hysterectomy--A large surgical cut (incision) is made in the abdomen. The uterus is removed through this incision.  Vaginal hysterectomy--An incision is made in the vagina. The uterus is removed through this incision. There are no abdominal incisions.  Conventional laparoscopic hysterectomy--Three or four small incisions are made in the abdomen. A thin, lighted tube with a camera (laparoscope) is inserted into one of the incisions. Other tools are put through the other incisions. The uterus is cut into small pieces. The small pieces are removed through the incisions, or they  are removed through the vagina.  Laparoscopically assisted vaginal hysterectomy (LAVH)--Three or four small incisions are made in the abdomen. Part of the surgery is performed laparoscopically and part vaginally. The uterus is removed through the vagina.  Robot-assisted laparoscopic hysterectomy--A laparoscope and other tools are inserted into 3 or 4 small incisions in the abdomen. A computer-controlled device is used to give the surgeon a 3D image and to help control the surgical instruments. This allows for more precise movements of surgical instruments. The uterus is cut into small pieces and removed through the incisions or removed through the vagina. RISKS AND COMPLICATIONS  Possible complications associated with this procedure include: 8. Bleeding and risk of blood transfusion. Tell your health care provider if you  do not want to receive any blood products. 9. Blood clots in the legs or lung. 10. Infection. 11. Injury to surrounding organs. 12. Problems or side effects related to anesthesia. 13. Conversion to an abdominal hysterectomy from one of the other techniques. WHAT TO EXPECT AFTER A HYSTERECTOMY 12. You will be given pain medicine. 13. You will need to have someone with you for the first 3-5 days after you go home. 60. You will need to follow up with your surgeon in 2-4 weeks after surgery to evaluate your progress. 15. You may have early menopause symptoms such as hot flashes, night sweats, and insomnia. 86. If you had a hysterectomy for a problem that was not cancer or not a condition that could lead to cancer, then you no longer need Pap tests. However, even if you no longer need a Pap test, a regular exam is a good idea to make sure no other problems are starting. Document Released: 03/23/2001 Document Revised: 07/18/2013 Document Reviewed: 06/04/2013 [facility] Patient Information 2015 [place], [place]. This information is not intended to replace advice given to you by your health  care provider. Make sure you discuss any questions you have with your health care provider. PATIENT INSTRUCTIONS POST-ANESTHESIA  IMMEDIATELY FOLLOWING SURGERY:  Do not drive or operate machinery for the first twenty four hours after surgery.  Do not make any important decisions for twenty four hours after surgery or while taking narcotic pain medications or sedatives.  If you develop intractable nausea and vomiting or a severe headache please notify your doctor immediately.  FOLLOW-UP:  Please make an appointment with your surgeon as instructed. You do not need to follow up with anesthesia unless specifically instructed to do so.  WOUND CARE INSTRUCTIONS (if applicable):  Keep a dry clean dressing on the anesthesia/puncture wound site if there is drainage.  Once the wound has quit draining you may leave it open to air.  Generally you should leave the bandage intact for twenty four hours unless there is drainage.  If the epidural site drains for more than 36-48 hours please call the anesthesia department.  QUESTIONS?:  Please feel free to call your physician or the hospital operator if you have any questions, and they will be happy to assist you.

## 2015-06-04 NOTE — Telephone Encounter (Signed)
I spoke with the pt and advised her that per Dr. Glo Herring she could have bleeding and that if she is still bleeding next Tuesday to let him know. Pt verblaized understanding.

## 2015-06-04 NOTE — Telephone Encounter (Signed)
Patient was called and made aware. 

## 2015-06-04 NOTE — Telephone Encounter (Signed)
I believe rectocele repair should help with her bowel movements. Please let her know

## 2015-06-05 ENCOUNTER — Encounter (HOSPITAL_COMMUNITY): Payer: Self-pay

## 2015-06-05 ENCOUNTER — Encounter (HOSPITAL_COMMUNITY)
Admission: RE | Admit: 2015-06-05 | Discharge: 2015-06-05 | Disposition: A | Discharge status: Home / Self Care | Payer: BLUE CROSS/BLUE SHIELD | Source: Ambulatory Visit | Attending: Obstetrics and Gynecology | Admitting: Obstetrics and Gynecology

## 2015-06-05 ENCOUNTER — Other Ambulatory Visit: Payer: Self-pay | Admitting: Obstetrics and Gynecology

## 2015-06-05 DIAGNOSIS — N816 Rectocele: Secondary | ICD-10-CM | POA: Insufficient documentation

## 2015-06-05 DIAGNOSIS — N8110 Cystocele, unspecified: Secondary | ICD-10-CM | POA: Diagnosis not present

## 2015-06-05 DIAGNOSIS — Z01818 Encounter for other preprocedural examination: Secondary | ICD-10-CM | POA: Insufficient documentation

## 2015-06-05 HISTORY — DX: Anemia, unspecified: D64.9

## 2015-06-05 HISTORY — DX: Atrophy of kidney (terminal): N26.1

## 2015-06-05 LAB — BASIC METABOLIC PANEL
ANION GAP: 6 (ref 5–15)
BUN: 10 mg/dL (ref 6–20)
CHLORIDE: 103 mmol/L (ref 101–111)
CO2: 26 mmol/L (ref 22–32)
Calcium: 10 mg/dL (ref 8.9–10.3)
Creatinine, Ser: 0.88 mg/dL (ref 0.44–1.00)
GFR calc Af Amer: >60 mL/min (ref >60)
GFR calc non Af Amer: >60 mL/min (ref >60)
Glucose, Bld: 85 mg/dL (ref 65–99)
POTASSIUM: 4.6 mmol/L (ref 3.5–5.1)
Sodium: 135 mmol/L (ref 135–145)

## 2015-06-05 LAB — URINALYSIS, ROUTINE W REFLEX MICROSCOPIC
Bilirubin Urine: NEGATIVE
Glucose, UA: NEGATIVE mg/dL
Ketones, ur: NEGATIVE mg/dL
NITRITE: NEGATIVE
PH: 6 (ref 5.0–8.0)
Protein, ur: NEGATIVE mg/dL
Urobilinogen, UA: 0.2 mg/dL (ref 0.0–1.0)

## 2015-06-05 LAB — CBC
HEMATOCRIT: 33.9 % — AB (ref 36.0–46.0)
HEMOGLOBIN: 10.9 g/dL — AB (ref 12.0–15.0)
MCH: 27 pg (ref 26.0–34.0)
MCHC: 32.2 g/dL (ref 30.0–36.0)
MCV: 83.9 fL (ref 78.0–100.0)
Platelets: 366 10*3/uL (ref 150–400)
RBC: 4.04 MIL/uL (ref 3.87–5.11)
RDW: 14.7 % (ref 11.5–15.5)
WBC: 9.3 10*3/uL (ref 4.0–10.5)

## 2015-06-05 LAB — HCG, SERUM, QUALITATIVE: Preg, Serum: NEGATIVE

## 2015-06-05 LAB — URINE MICROSCOPIC-ADD ON

## 2015-06-09 ENCOUNTER — Telehealth: Payer: Self-pay | Admitting: Obstetrics and Gynecology

## 2015-06-09 NOTE — Telephone Encounter (Signed)
Pt states that she wanted to know if she would have restrictions on her food, is she going to have any bleeding and does she need to get a donut pillow to sit on and does she need to shave "down there". Pt was advised that they would increase her diet as she can tolerate it and that she may have some spotting, the donut pillow is at her discretion, and shaving was also her preference.

## 2015-06-10 ENCOUNTER — Observation Stay (HOSPITAL_COMMUNITY)
Admission: AD | Admit: 2015-06-10 | Discharge: 2015-06-11 | Disposition: A | Discharge status: Home / Self Care | Payer: BLUE CROSS/BLUE SHIELD | Source: Ambulatory Visit | Attending: Obstetrics and Gynecology | Admitting: Obstetrics and Gynecology

## 2015-06-10 ENCOUNTER — Encounter (HOSPITAL_COMMUNITY): Payer: Self-pay | Admitting: *Deleted

## 2015-06-10 ENCOUNTER — Inpatient Hospital Stay (HOSPITAL_COMMUNITY): Payer: BLUE CROSS/BLUE SHIELD | Admitting: Anesthesiology

## 2015-06-10 ENCOUNTER — Encounter (HOSPITAL_COMMUNITY)
Admission: AD | Disposition: A | Discharge status: Home / Self Care | Payer: Self-pay | Source: Ambulatory Visit | Attending: Obstetrics and Gynecology

## 2015-06-10 DIAGNOSIS — Z90710 Acquired absence of both cervix and uterus: Secondary | ICD-10-CM | POA: Diagnosis present

## 2015-06-10 DIAGNOSIS — N8111 Cystocele, midline: Secondary | ICD-10-CM

## 2015-06-10 DIAGNOSIS — N393 Stress incontinence (female) (male): Secondary | ICD-10-CM | POA: Insufficient documentation

## 2015-06-10 DIAGNOSIS — F419 Anxiety disorder, unspecified: Secondary | ICD-10-CM | POA: Insufficient documentation

## 2015-06-10 DIAGNOSIS — Z85828 Personal history of other malignant neoplasm of skin: Secondary | ICD-10-CM | POA: Diagnosis not present

## 2015-06-10 DIAGNOSIS — N939 Abnormal uterine and vaginal bleeding, unspecified: Secondary | ICD-10-CM | POA: Diagnosis not present

## 2015-06-10 DIAGNOSIS — D259 Leiomyoma of uterus, unspecified: Secondary | ICD-10-CM | POA: Diagnosis not present

## 2015-06-10 DIAGNOSIS — N72 Inflammatory disease of cervix uteri: Secondary | ICD-10-CM | POA: Insufficient documentation

## 2015-06-10 DIAGNOSIS — N880 Leukoplakia of cervix uteri: Secondary | ICD-10-CM | POA: Insufficient documentation

## 2015-06-10 DIAGNOSIS — Q5210 Doubling of vagina, unspecified: Secondary | ICD-10-CM | POA: Diagnosis not present

## 2015-06-10 DIAGNOSIS — N812 Incomplete uterovaginal prolapse: Secondary | ICD-10-CM | POA: Diagnosis present

## 2015-06-10 DIAGNOSIS — F329 Major depressive disorder, single episode, unspecified: Secondary | ICD-10-CM | POA: Diagnosis not present

## 2015-06-10 DIAGNOSIS — N816 Rectocele: Secondary | ICD-10-CM

## 2015-06-10 DIAGNOSIS — Z8614 Personal history of Methicillin resistant Staphylococcus aureus infection: Secondary | ICD-10-CM | POA: Insufficient documentation

## 2015-06-10 DIAGNOSIS — K644 Residual hemorrhoidal skin tags: Secondary | ICD-10-CM | POA: Insufficient documentation

## 2015-06-10 DIAGNOSIS — D251 Intramural leiomyoma of uterus: Secondary | ICD-10-CM

## 2015-06-10 DIAGNOSIS — K5900 Constipation, unspecified: Secondary | ICD-10-CM | POA: Insufficient documentation

## 2015-06-10 DIAGNOSIS — M1990 Unspecified osteoarthritis, unspecified site: Secondary | ICD-10-CM | POA: Diagnosis not present

## 2015-06-10 DIAGNOSIS — G43909 Migraine, unspecified, not intractable, without status migrainosus: Secondary | ICD-10-CM | POA: Diagnosis not present

## 2015-06-10 DIAGNOSIS — K589 Irritable bowel syndrome without diarrhea: Secondary | ICD-10-CM | POA: Diagnosis not present

## 2015-06-10 DIAGNOSIS — K219 Gastro-esophageal reflux disease without esophagitis: Secondary | ICD-10-CM | POA: Diagnosis not present

## 2015-06-10 HISTORY — PX: VAGINAL HYSTERECTOMY: SHX2639

## 2015-06-10 HISTORY — PX: CYSTOCELE REPAIR: SHX163

## 2015-06-10 HISTORY — PX: RECTOCELE REPAIR: SHX761

## 2015-06-10 LAB — ABO/RH: ABO/RH(D): O POS

## 2015-06-10 LAB — PREPARE RBC (CROSSMATCH)

## 2015-06-10 SURGERY — HYSTERECTOMY, VAGINAL
Anesthesia: General

## 2015-06-10 MED ORDER — LIDOCAINE HCL (PF) 1 % IJ SOLN
INTRAMUSCULAR | Status: AC
  Filled 2015-06-10: qty 5

## 2015-06-10 MED ORDER — KETOROLAC TROMETHAMINE 30 MG/ML IJ SOLN
INTRAMUSCULAR | Status: AC
  Filled 2015-06-10: qty 1

## 2015-06-10 MED ORDER — PROPOFOL 10 MG/ML IV BOLUS
INTRAVENOUS | Status: DC | PRN
  Administered 2015-06-10: 100 mg via INTRAVENOUS

## 2015-06-10 MED ORDER — ONDANSETRON HCL 4 MG/2ML IJ SOLN: 4.0000 mg | Freq: Four times a day (QID) | INTRAMUSCULAR | Status: DC | PRN

## 2015-06-10 MED ORDER — KETOROLAC TROMETHAMINE 30 MG/ML IJ SOLN: 30.0000 mg | Freq: Four times a day (QID) | INTRAMUSCULAR | Status: DC

## 2015-06-10 MED ORDER — ONDANSETRON HCL 4 MG/2ML IJ SOLN
4.0000 mg | Freq: Once | INTRAMUSCULAR | Status: AC | PRN
  Administered 2015-06-10: 4 mg via INTRAVENOUS
  Filled 2015-06-10: qty 2

## 2015-06-10 MED ORDER — ROCURONIUM BROMIDE 100 MG/10ML IV SOLN
INTRAVENOUS | Status: DC | PRN
  Administered 2015-06-10: 10 mg via INTRAVENOUS
  Administered 2015-06-10: 15 mg via INTRAVENOUS

## 2015-06-10 MED ORDER — BUPIVACAINE-EPINEPHRINE 0.5% -1:200000 IJ SOLN
INTRAMUSCULAR | Status: DC | PRN
  Administered 2015-06-10: 27.5 mL

## 2015-06-10 MED ORDER — KETOROLAC TROMETHAMINE 30 MG/ML IJ SOLN
30.0000 mg | Freq: Once | INTRAMUSCULAR | Status: AC
  Administered 2015-06-10: 30 mg via INTRAVENOUS

## 2015-06-10 MED ORDER — POLYETHYLENE GLYCOL 3350 17 G PO PACK
17.0000 g | PACK | Freq: Every day | ORAL | Status: DC
  Administered 2015-06-11: 17 g via ORAL
  Filled 2015-06-10: qty 1

## 2015-06-10 MED ORDER — SUFENTANIL CITRATE 50 MCG/ML IV SOLN
INTRAVENOUS | Status: AC
  Filled 2015-06-10: qty 1

## 2015-06-10 MED ORDER — ONDANSETRON HCL 4 MG/2ML IJ SOLN
4.0000 mg | Freq: Once | INTRAMUSCULAR | Status: AC
  Administered 2015-06-10: 4 mg via INTRAVENOUS

## 2015-06-10 MED ORDER — LORATADINE 10 MG PO TABS
10.0000 mg | ORAL_TABLET | Freq: Every day | ORAL | Status: DC
  Administered 2015-06-11: 10 mg via ORAL
  Filled 2015-06-10 (×2): qty 1

## 2015-06-10 MED ORDER — ONDANSETRON HCL 4 MG PO TABS: 4.0000 mg | ORAL_TABLET | Freq: Four times a day (QID) | ORAL | Status: DC | PRN

## 2015-06-10 MED ORDER — SUCCINYLCHOLINE CHLORIDE 20 MG/ML IJ SOLN
INTRAMUSCULAR | Status: AC
  Filled 2015-06-10: qty 1

## 2015-06-10 MED ORDER — DIPHENHYDRAMINE HCL 12.5 MG/5ML PO ELIX: 12.5000 mg | ORAL_SOLUTION | Freq: Four times a day (QID) | ORAL | Status: DC | PRN

## 2015-06-10 MED ORDER — PROPOFOL 10 MG/ML IV BOLUS
INTRAVENOUS | Status: AC
  Filled 2015-06-10: qty 20

## 2015-06-10 MED ORDER — SODIUM CHLORIDE 0.9 % IR SOLN
Status: DC | PRN
  Administered 2015-06-10: 3000 mL

## 2015-06-10 MED ORDER — LACTATED RINGERS IV SOLN
INTRAVENOUS | Status: DC
  Administered 2015-06-10 (×3): via INTRAVENOUS

## 2015-06-10 MED ORDER — LINACLOTIDE 145 MCG PO CAPS
290.0000 ug | ORAL_CAPSULE | Freq: Every day | ORAL | Status: DC
  Administered 2015-06-10 – 2015-06-11 (×2): 290 ug via ORAL
  Filled 2015-06-10 (×2): qty 2

## 2015-06-10 MED ORDER — EPHEDRINE SULFATE 50 MG/ML IJ SOLN
INTRAMUSCULAR | Status: DC | PRN
  Administered 2015-06-10: 5 mg via INTRAVENOUS

## 2015-06-10 MED ORDER — SODIUM CHLORIDE 0.9 % IV SOLN
INTRAVENOUS | Status: DC
  Administered 2015-06-10: 125 mL/h via INTRAVENOUS
  Administered 2015-06-10: 13:00:00 via INTRAVENOUS
  Administered 2015-06-11: 125 mL/h via INTRAVENOUS

## 2015-06-10 MED ORDER — GLYCOPYRROLATE 0.2 MG/ML IJ SOLN
INTRAMUSCULAR | Status: DC | PRN
  Administered 2015-06-10: 0.6 mg via INTRAVENOUS

## 2015-06-10 MED ORDER — DEXAMETHASONE SODIUM PHOSPHATE 4 MG/ML IJ SOLN
4.0000 mg | Freq: Once | INTRAMUSCULAR | Status: AC
  Administered 2015-06-10: 4 mg via INTRAVENOUS

## 2015-06-10 MED ORDER — PANTOPRAZOLE SODIUM 40 MG PO TBEC
80.0000 mg | DELAYED_RELEASE_TABLET | Freq: Every day | ORAL | Status: DC
  Filled 2015-06-10: qty 2

## 2015-06-10 MED ORDER — SCOPOLAMINE 1 MG/3DAYS TD PT72
MEDICATED_PATCH | TRANSDERMAL | Status: AC
  Filled 2015-06-10: qty 1

## 2015-06-10 MED ORDER — BUPIVACAINE-EPINEPHRINE (PF) 0.5% -1:200000 IJ SOLN
INTRAMUSCULAR | Status: AC
  Filled 2015-06-10: qty 30

## 2015-06-10 MED ORDER — MIDAZOLAM HCL 2 MG/2ML IJ SOLN
INTRAMUSCULAR | Status: AC
  Filled 2015-06-10: qty 2

## 2015-06-10 MED ORDER — PANTOPRAZOLE SODIUM 40 MG PO TBEC: 40.0000 mg | DELAYED_RELEASE_TABLET | Freq: Every day | ORAL | Status: DC

## 2015-06-10 MED ORDER — NALOXONE HCL 0.4 MG/ML IJ SOLN: 0.4000 mg | INTRAMUSCULAR | Status: DC | PRN

## 2015-06-10 MED ORDER — SCOPOLAMINE 1 MG/3DAYS TD PT72
1.0000 | MEDICATED_PATCH | Freq: Once | TRANSDERMAL | Status: DC
  Administered 2015-06-10: 1.5 mg via TRANSDERMAL

## 2015-06-10 MED ORDER — DIPHENHYDRAMINE HCL 50 MG/ML IJ SOLN: 12.5000 mg | Freq: Four times a day (QID) | INTRAMUSCULAR | Status: DC | PRN

## 2015-06-10 MED ORDER — GLYCOPYRROLATE 0.2 MG/ML IJ SOLN
INTRAMUSCULAR | Status: AC
  Filled 2015-06-10: qty 3

## 2015-06-10 MED ORDER — GLYCOPYRROLATE 0.2 MG/ML IJ SOLN
INTRAMUSCULAR | Status: AC
  Filled 2015-06-10: qty 1

## 2015-06-10 MED ORDER — SODIUM CHLORIDE 0.9 % IJ SOLN: 9.0000 mL | INTRAMUSCULAR | Status: DC | PRN

## 2015-06-10 MED ORDER — OXYCODONE-ACETAMINOPHEN 5-325 MG PO TABS: 1.0000 | ORAL_TABLET | ORAL | Status: DC | PRN

## 2015-06-10 MED ORDER — MIDAZOLAM HCL 2 MG/2ML IJ SOLN
1.0000 mg | INTRAMUSCULAR | Status: DC | PRN
  Administered 2015-06-10: 2 mg via INTRAVENOUS

## 2015-06-10 MED ORDER — HYDROMORPHONE 0.3 MG/ML IV SOLN
INTRAVENOUS | Status: AC
  Filled 2015-06-10: qty 25

## 2015-06-10 MED ORDER — SUFENTANIL CITRATE 50 MCG/ML IV SOLN
INTRAVENOUS | Status: DC | PRN
  Administered 2015-06-10 (×2): 5 ug via INTRAVENOUS
  Administered 2015-06-10 (×2): 10 ug via INTRAVENOUS
  Administered 2015-06-10 (×2): 5 ug via INTRAVENOUS

## 2015-06-10 MED ORDER — FENTANYL CITRATE (PF) 100 MCG/2ML IJ SOLN: 25.0000 ug | INTRAMUSCULAR | Status: DC | PRN

## 2015-06-10 MED ORDER — IBUPROFEN 600 MG PO TABS: 600.0000 mg | ORAL_TABLET | Freq: Four times a day (QID) | ORAL | Status: DC | PRN

## 2015-06-10 MED ORDER — LIDOCAINE HCL (CARDIAC) 20 MG/ML IV SOLN
INTRAVENOUS | Status: DC | PRN
  Administered 2015-06-10: 50 mg via INTRAVENOUS

## 2015-06-10 MED ORDER — NEOSTIGMINE METHYLSULFATE 10 MG/10ML IV SOLN
INTRAVENOUS | Status: DC | PRN
  Administered 2015-06-10: 4 mg via INTRAVENOUS

## 2015-06-10 MED ORDER — SUCCINYLCHOLINE CHLORIDE 20 MG/ML IJ SOLN
INTRAMUSCULAR | Status: DC | PRN
  Administered 2015-06-10: 100 mg via INTRAVENOUS

## 2015-06-10 MED ORDER — CEFAZOLIN SODIUM-DEXTROSE 2-3 GM-% IV SOLR
2.0000 g | INTRAVENOUS | Status: AC
  Administered 2015-06-10: 2 g via INTRAVENOUS

## 2015-06-10 MED ORDER — CITALOPRAM HYDROBROMIDE 20 MG PO TABS
40.0000 mg | ORAL_TABLET | Freq: Every day | ORAL | Status: DC
  Administered 2015-06-10 – 2015-06-11 (×2): 40 mg via ORAL
  Filled 2015-06-10 (×2): qty 2

## 2015-06-10 MED ORDER — SODIUM CHLORIDE 0.9 % IJ SOLN
INTRAMUSCULAR | Status: AC
  Filled 2015-06-10: qty 10

## 2015-06-10 MED ORDER — DEXAMETHASONE SODIUM PHOSPHATE 4 MG/ML IJ SOLN
INTRAMUSCULAR | Status: AC
  Filled 2015-06-10: qty 1

## 2015-06-10 MED ORDER — GLYCOPYRROLATE 0.2 MG/ML IJ SOLN
0.2000 mg | Freq: Once | INTRAMUSCULAR | Status: AC
  Administered 2015-06-10: 0.2 mg via INTRAVENOUS

## 2015-06-10 MED ORDER — STERILE WATER FOR IRRIGATION IR SOLN
Status: DC | PRN
  Administered 2015-06-10: 1000 mL

## 2015-06-10 MED ORDER — ONDANSETRON HCL 4 MG/2ML IJ SOLN
INTRAMUSCULAR | Status: AC
  Filled 2015-06-10: qty 2

## 2015-06-10 MED ORDER — NEOSTIGMINE METHYLSULFATE 10 MG/10ML IV SOLN
INTRAVENOUS | Status: AC
  Filled 2015-06-10: qty 1

## 2015-06-10 MED ORDER — HYDROMORPHONE 0.3 MG/ML IV SOLN
INTRAVENOUS | Status: DC
  Administered 2015-06-10: 0.9 mg via INTRAVENOUS
  Administered 2015-06-10: 1.2 mg via INTRAVENOUS
  Administered 2015-06-10: 12:00:00 via INTRAVENOUS
  Administered 2015-06-11 (×2): 0.3 mg via INTRAVENOUS
  Administered 2015-06-11: 0.6 mg via INTRAVENOUS

## 2015-06-10 MED ORDER — 0.9 % SODIUM CHLORIDE (POUR BTL) OPTIME
TOPICAL | Status: DC | PRN
  Administered 2015-06-10: 1000 mL

## 2015-06-10 MED ORDER — CEFAZOLIN SODIUM-DEXTROSE 2-3 GM-% IV SOLR
INTRAVENOUS | Status: AC
  Filled 2015-06-10: qty 50

## 2015-06-10 MED ORDER — KETOROLAC TROMETHAMINE 30 MG/ML IJ SOLN
30.0000 mg | Freq: Four times a day (QID) | INTRAMUSCULAR | Status: DC
Start: 2015-06-10 — End: 2015-06-11
  Administered 2015-06-10 – 2015-06-11 (×3): 30 mg via INTRAVENOUS
  Filled 2015-06-10 (×3): qty 1

## 2015-06-10 MED ORDER — EPHEDRINE SULFATE 50 MG/ML IJ SOLN
INTRAMUSCULAR | Status: AC
  Filled 2015-06-10: qty 1

## 2015-06-10 SURGICAL SUPPLY — 46 items
BAG HAMPER (MISCELLANEOUS) ×3 IMPLANT
CLOTH BEACON ORANGE TIMEOUT ST (SAFETY) ×3 IMPLANT
COVER LIGHT HANDLE STERIS (MISCELLANEOUS) ×6 IMPLANT
COVER MAYO STAND XLG (DRAPE) ×3 IMPLANT
DECANTER SPIKE VIAL GLASS SM (MISCELLANEOUS) ×3 IMPLANT
DRAPE PROXIMA HALF (DRAPES) ×3 IMPLANT
DRAPE STERI URO 9X17 APER PCH (DRAPES) ×3 IMPLANT
ELECT REM PT RETURN 9FT ADLT (ELECTROSURGICAL) ×3
ELECTRODE REM PT RTRN 9FT ADLT (ELECTROSURGICAL) ×1 IMPLANT
FORMALIN 10 PREFIL 480ML (MISCELLANEOUS) ×6 IMPLANT
GAUZE PACKING 2X5 YD STRL (GAUZE/BANDAGES/DRESSINGS) IMPLANT
GAUZE PACKING FOLDED 2  STR (GAUZE/BANDAGES/DRESSINGS) ×2
GAUZE PACKING FOLDED 2 STR (GAUZE/BANDAGES/DRESSINGS) ×1 IMPLANT
GLOVE BIO SURGEON STRL SZ 6.5 (GLOVE) ×2 IMPLANT
GLOVE BIO SURGEONS STRL SZ 6.5 (GLOVE) ×1
GLOVE BIOGEL PI IND STRL 7.0 (GLOVE) ×3 IMPLANT
GLOVE BIOGEL PI IND STRL 7.5 (GLOVE) ×1 IMPLANT
GLOVE BIOGEL PI IND STRL 9 (GLOVE) ×1 IMPLANT
GLOVE BIOGEL PI INDICATOR 7.0 (GLOVE) ×6
GLOVE BIOGEL PI INDICATOR 7.5 (GLOVE) ×2
GLOVE BIOGEL PI INDICATOR 9 (GLOVE) ×2
GLOVE ECLIPSE 7.0 STRL STRAW (GLOVE) ×3 IMPLANT
GLOVE ECLIPSE 9.0 STRL (GLOVE) ×3 IMPLANT
GOWN SPEC L3 XXLG W/TWL (GOWN DISPOSABLE) ×6 IMPLANT
GOWN STRL REUS W/TWL LRG LVL3 (GOWN DISPOSABLE) ×9 IMPLANT
IV NS IRRIG 3000ML ARTHROMATIC (IV SOLUTION) ×3 IMPLANT
KIT ROOM TURNOVER AP CYSTO (KITS) ×3 IMPLANT
MANIFOLD NEPTUNE II (INSTRUMENTS) ×3 IMPLANT
NEEDLE HYPO 25X1 1.5 SAFETY (NEEDLE) ×3 IMPLANT
NS IRRIG 1000ML POUR BTL (IV SOLUTION) ×3 IMPLANT
PACK PERI GYN (CUSTOM PROCEDURE TRAY) ×3 IMPLANT
PAD ARMBOARD 7.5X6 YLW CONV (MISCELLANEOUS) ×3 IMPLANT
SET BASIN LINEN APH (SET/KITS/TRAYS/PACK) ×3 IMPLANT
SET IRRIG Y TYPE TUR BLADDER L (SET/KITS/TRAYS/PACK) ×3 IMPLANT
SUT CHROMIC 0 CT 1 (SUTURE) ×30 IMPLANT
SUT CHROMIC 2 0 CT 1 (SUTURE) ×3 IMPLANT
SUT CHROMIC GUT BROWN 0 54 (SUTURE) IMPLANT
SUT CHROMIC GUT BROWN 0 54IN (SUTURE)
SUT PROLENE 2 0 FS (SUTURE) IMPLANT
SUT PROLENE 2 0 SH 30 (SUTURE) ×3 IMPLANT
SUT VIC AB 0 CT1 27 (SUTURE) ×3
SUT VIC AB 0 CT1 27XCR 8 STRN (SUTURE) ×1 IMPLANT
SUT VIC AB 0 CT2 8-18 (SUTURE) ×3 IMPLANT
TRAY FOLEY CATH SILVER 16FR (SET/KITS/TRAYS/PACK) ×3 IMPLANT
VERSALIGHT (MISCELLANEOUS) ×3 IMPLANT
WATER STERILE IRR 1000ML POUR (IV SOLUTION) ×3 IMPLANT

## 2015-06-10 NOTE — Anesthesia Procedure Notes (Signed)
Procedure Name: Intubation Date/Time: 06/10/2015 7:42 AM Performed by: Andree Elk, AMY A Pre-anesthesia Checklist: Patient identified, Patient being monitored, Timeout performed, Emergency Drugs available and Suction available Patient Re-evaluated:Patient Re-evaluated prior to inductionOxygen Delivery Method: Circle System Utilized Preoxygenation: Pre-oxygenation with 100% oxygen Intubation Type: IV induction, Rapid sequence and Cricoid Pressure applied Laryngoscope Size: 3 and Miller Grade View: Grade II Tube type: Oral Tube size: 7.0 mm Number of attempts: 1 Airway Equipment and Method: Stylet Placement Confirmation: ETT inserted through vocal cords under direct vision,  positive ETCO2 and breath sounds checked- equal and bilateral Secured at: 21 cm Tube secured with: Tape Dental Injury: Teeth and Oropharynx as per pre-operative assessment

## 2015-06-10 NOTE — Anesthesia Postprocedure Evaluation (Signed)
  Anesthesia Post-op Note  Patient: Marie Hurley  Procedure(s) Performed: Procedure(s): HYSTERECTOMY VAGINAL (N/A) ANTERIOR REPAIR (CYSTOCELE) (N/A) POSTERIOR REPAIR (RECTOCELE) (N/A)  Patient Location: PACU  Anesthesia Type:General  Level of Consciousness: awake, alert , oriented and patient cooperative  Airway and Oxygen Therapy: Patient Spontanous Breathing and Patient connected to face mask oxygen  Post-op Pain: none  Post-op Assessment: Post-op Vital signs reviewed, Patient's Cardiovascular Status Stable, Respiratory Function Stable, Patent Airway, No signs of Nausea or vomiting and Pain level controlled              Post-op Vital Signs: Reviewed and stable  Last Vitals:  Filed Vitals:   06/10/15 1015  BP: 105/43  Pulse: 85  Temp:   Resp: 15    Complications: No apparent anesthesia complications

## 2015-06-10 NOTE — H&P (Signed)
[facility] Visit  Patient name: Marie Coonrod WrightMRN [id number] Date of birth: 12-09-1965  CC & HPI:  Marie Hurley is a 49 y.o. 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 U/s reviewed.  ROS:  Splints to defecate. Chronic constipation, has had to disimpact self , chronic hemorrhoids.  Pertinent History Reviewed:  Reviewed: Significant for [facility]  Past Medical History  Diagnosis Date  . Bowel obstruction   . IBS (irritable bowel syndrome)   . GERD (gastroesophageal reflux disease)   . Migraine   . Basal cell carcinoma of chest wall 2015    rIGHT IN THE MIDDLE OF THE PATIENT'S CHEST  . Seasonal allergies   . Arthritis   . Depression   . Anxiety   . PONV (postoperative nausea and vomiting)   . Hx MRSA infection 2008    multiple skin lesions  . Complete rotator cuff tear 05/24/2014    Surgical Hx:  Past Surgical History  Procedure Laterality Date  . Tubal ligation    . Carpal tunnel release      rt  . Ercp  2005,2007,2013    several  . Dilation and curettage of uterus  '91 & '96  . Colonoscopy with propofol N/A 02/07/2014    Procedure: COLONOSCOPY WITH PROPOFOL; Surgeon: Rogene Houston, MD; Location: [facility]; Service: Endoscopy; Laterality: N/A; entered cecum @ 0756 ; total cecal withdrawal time- 9 min  . Diagnostic laparoscopy  2007    exploratory-nic bowel duct  . Cholecystectomy  2002    lap choli  . Colonoscopy    . Shoulder arthroscopy with subacromial decompression, rotator cuff repair and bicep tendon repair Right 05/24/2014    Procedure: RIGHT SHOULDER ARTHROSCOPY DEBRIDEMENT LIMITED, DECOMPRESSION SUBACROMIAL PARTIAL ACROMIOPLASTY WITH ROTATOR CUFF REPAIR; Surgeon: Johnny Bridge, MD; Location:  [place]; Service: Orthopedics; Laterality: Right;   Medications: Reviewed & Updated - see associated section   Current outpatient prescriptions:  . B Complex-C (B-COMPLEX WITH VITAMIN C) tablet, Take 1 tablet by mouth daily., Disp: , Rfl:  . citalopram (CELEXA) 20 MG tablet, Take 20 mg by mouth daily., Disp: , Rfl:  . clonazePAM (KLONOPIN) 1 MG tablet, Take 1 mg by mouth 3 (three) times daily as needed. Anxiety/depression., Disp: , Rfl:  . dimenhyDRINATE (DRAMAMINE) 50 MG tablet, Take 50 mg by mouth every 8 (eight) hours as needed. Upset stomach., Disp: , Rfl:  . fexofenadine (ALLEGRA) 180 MG tablet, Take 180 mg by mouth daily., Disp: , Rfl:  . HYDROcodone-acetaminophen (NORCO/VICODIN) 5-325 MG per tablet, Take 1 tablet by mouth every 6 (six) hours as needed for moderate pain., Disp: , Rfl:  . hydrocortisone-pramoxine (ANALPRAM-HC) 2.5-1 % rectal cream, Place 1 application rectally as needed for hemorrhoids. , Disp: , Rfl:  . hyoscyamine (LEVBID) 0.375 MG 12 hr tablet, TAKE ONE TABLET BY MOUTH THREE TIMES DAILY BEFORE MEALS, Disp: 90 tablet, Rfl: 3 . Multiple Vitamin (DAILY VITAMIN PO), Take 1 tablet by mouth daily. , Disp: , Rfl:  . NEXIUM 40 MG capsule, TAKE ONE CAPSULE TWICE DAILY, Disp: 60 capsule, Rfl: 5 . ondansetron (ZOFRAN) 4 MG tablet, Take 1 tablet (4 mg total) by mouth every 8 (eight) hours as needed for nausea or vomiting., Disp: 30 tablet, Rfl: 0 . polyethylene glycol powder (GLYCOLAX/MIRALAX) powder, TAKE 34 GRAMS (TWO CAPFULS) IN 8 OZ OF FLUID TWICE DAILY, Disp: 2108 g, Rfl: 5 . SALINE NASAL SPRAY NA,  Place 1 spray into the nose 4 (four) times daily as needed. Allergies., Disp: , Rfl:    Social History: Reviewed -  reports that she has never smoked. She has never used smokeless tobacco.  Objective Findings:  Vitals: Blood pressure 110/60, height 5\' 3"  (1.6 m), weight 120 lb 8 oz (54.658 kg), last menstrual period  05/09/2015.  Physical Examination: General appearance - alert, well appearing, and in no distress, oriented to person, place, and time and normal appearing weight Mental status - alert, oriented to person, place, and time, normal mood, behavior, speech, dress, motor activity, and thought processes Eyes - pupils equal and reactive, extraocular eye movements intact Abdomen - soft, nontender, nondistended, no masses or organomegaly Pelvic - VULVA: normal appearing vulva with no masses, tenderness or lesions, VAGINA: normal appearing vagina with normal color and discharge, no lesions, PELVIC FLOOR EXAM: rectocele to 90 degree, cystocele mild, uterine descensus grade 2, CERVIX: normal appearing cervix without discharge or lesions, UTERUS: uterus is normal size, shape, consistency and nontender, ADNEXA: normal adnexa in size, nontender and no masses, RECTAL: normal rectal, no masses, rectocele noted to > 90degrees, ,  Musculoskeletal - no joint tenderness, deformity or swelling, no muscular tenderness noted Extremities - peripheral pulses normal, no pedal edema, no clubbing or cyanosis   Assessment & Plan:     Endometrial Biopsy: lmp 13 July Patient given informed consent, signed copy in the chart, time out was performed. Time out taken. The patient was placed in the lithotomy position and the cervix brought into view with sterile speculum. Portio of cervix cleansed x 2 with betadine swabs. A tenaculum was placed in the anterior lip of the cervix. The uterus was sounded for depth of 9 cm,. Milex uterine Explora 3 mm was introduced to into the uterus, suction created, and an endometrial sample was obtained. All equipment was removed and accounted for.  The patient tolerated the procedure well.   Patient given post procedure instructions.   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 Pt is a candidate for Vag Hyst , Ant and post repair  She has been counselled,  consented for this procedure, and labs reviewed.

## 2015-06-10 NOTE — Brief Op Note (Signed)
06/10/2015  9:59 AM  PATIENT:  Marie Hurley  49 y.o. 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 Small cystocele  PROCEDURE:  Procedure(s): HYSTERECTOMY VAGINAL (N/A) ANTERIOR REPAIR (CYSTOCELE) (N/A) POSTERIOR REPAIR (RECTOCELE) (N/A)  SURGEON:  Surgeon(s) and Role:    * [name], MD - Primary  PHYSICIAN ASSISTANT:   ASSISTANTS: Caryl Pina, RN, Elita Boone CST  ANESTHESIA:   local and general  EBL:  Total I/O In: 2300 [I.V.:2300] Out: 400 [Blood:400]  BLOOD ADMINISTERED:none  DRAINS: Urinary Catheter (Foley) , vaginal pack soaked in Betadine  LOCAL MEDICATIONS USED:  MARCAINE    and Amount: 20 ml  SPECIMEN:  Source of Specimen:  Uterus and cervix  DISPOSITION OF SPECIMEN:  PATHOLOGY  COUNTS:  YES  TOURNIQUET:  * No tourniquets in log *  DICTATION: .Dragon Dictation  PLAN OF CARE: Admit for overnight observation  PATIENT DISPOSITION:  PACU - hemodynamically stable.   Delay start of Pharmacological VTE agent (>24hrs) due to surgical blood loss or risk of bleeding: not applicable

## 2015-06-10 NOTE — Telephone Encounter (Signed)
Left message x 3 with no response

## 2015-06-10 NOTE — Transfer of Care (Signed)
Immediate Anesthesia Transfer of Care Note  Patient: Marie Hurley  Procedure(s) Performed: Procedure(s): HYSTERECTOMY VAGINAL (N/A) ANTERIOR REPAIR (CYSTOCELE) (N/A) POSTERIOR REPAIR (RECTOCELE) (N/A)  Patient Location: PACU  Anesthesia Type:General  Level of Consciousness: awake, oriented and patient cooperative  Airway & Oxygen Therapy: Patient Spontanous Breathing and Patient connected to face mask oxygen  Post-op Assessment: Report given to RN and Post -op Vital signs reviewed and stable  Post vital signs: Reviewed and stable  Last Vitals:  Filed Vitals:   06/10/15 0730  BP: 111/71  Temp:   Resp: 48    Complications: No apparent anesthesia complications

## 2015-06-10 NOTE — Anesthesia Preprocedure Evaluation (Signed)
Anesthesia Evaluation  Patient identified by MRN, date of birth, ID band Patient awake    Reviewed: Allergy & Precautions, H&P , NPO status , Patient's Chart, lab work & pertinent test results  History of Anesthesia Complications (+) PONV and history of anesthetic complications  Airway Mallampati: I  TM Distance: >3 FB     Dental  (+) Teeth Intact   Pulmonary neg pulmonary ROS,  breath sounds clear to auscultation        Cardiovascular negative cardio ROS  Rhythm:Regular     Neuro/Psych  Headaches, PSYCHIATRIC DISORDERS Anxiety Depression    GI/Hepatic GERD-  Medicated and Controlled,  Endo/Other    Renal/GU      Musculoskeletal   Abdominal   Peds  Hematology   Anesthesia Other Findings   Reproductive/Obstetrics                             Anesthesia Physical Anesthesia Plan  ASA: II  Anesthesia Plan: General   Post-op Pain Management:    Induction: Intravenous, Rapid sequence and Cricoid pressure planned  Airway Management Planned: Oral ETT  Additional Equipment:   Intra-op Plan:   Post-operative Plan: Extubation in OR  Informed Consent: I have reviewed the patients History and Physical, chart, labs and discussed the procedure including the risks, benefits and alternatives for the proposed anesthesia with the patient or authorized representative who has indicated his/her understanding and acceptance.     Plan Discussed with:   Anesthesia Plan Comments:         Anesthesia Quick Evaluation

## 2015-06-10 NOTE — Op Note (Signed)
06/10/2015  9:59 AM  PATIENT:  Marie Hurley  49 y.o. 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 Small cystocele  PROCEDURE:  Procedure(s): HYSTERECTOMY VAGINAL (N/A) ANTERIOR REPAIR (CYSTOCELE) (N/A) POSTERIOR REPAIR (RECTOCELE) (N/A)  SURGEON:  Surgeon(s) and Role:    * [name], MD - Primary  PHYSICIAN ASSISTANT:   ASSISTANTS: Caryl Pina, RN, Elita Boone CST  ANESTHESIA:   local and general  EBL:  Total I/O In: 2300 [I.V.:2300] Out: 400 [Blood:400]  BLOOD ADMINISTERED:none  DRAINS: Urinary Catheter (Foley) , vaginal pack soaked in Betadine  LOCAL MEDICATIONS USED:  MARCAINE    and Amount: 20 ml  SPECIMEN:  Source of Specimen:  Uterus and cervix  DISPOSITION OF SPECIMEN:  PATHOLOGY  COUNTS:  YES  TOURNIQUET:  * No tourniquets in log *  DICTATION: .Dragon Dictation  PLAN OF CARE: Admit for overnight observation  PATIENT DISPOSITION:  PACU - hemodynamically stable.   Delay start of Pharmacological VTE agent (>24hrs) due to surgical blood loss or risk of bleeding: not applicable  Details of procedure: Patient was taken operating room prepped and draped for vaginal procedure with legs in standard lithotomy using candycane's with perineum at the table and well supported. Timeout was conducted, Ancef administered without reaction, and surgical procedure confirmed by operative team. Weighted short speculum was placed in the vagina the cervix grasped with Lahey tenaculum, traction placed and the cervix circumscribed with Bovie cautery. Marcaine was used to inject beneath the bladder to this procedure. The bowel scissors were used to dissect the cervicovaginal potential space and elevate the bladder well out of the way. Posteriorly a posterior colpotomy incision was made easily and the cul-de-sac. The sacral ligaments on each side were grasped the  curved Zeppelin clamp, transected and suture with 0 chromic. These were tagged for future orientation. Arnel ligaments were serially clamped cut and suture ligated on each side using Zeppelin clamps, Mayo scissors and 0 chromic suture ligature. Upper cardinal ligaments were treated similarly. We switched 0 Vicryl for increased ease in tying down sutures. Upon reaching the level of the mesosalpinx and broad ligament on each side, cervix was amputated off of the body of the uterus to allow for improved visibility. The uterus could then be rotated side to side allowing the final 2 bites on either side to easily be monitored. All was kept out of the way with a moist laparotomy tape during this portion of the case. Uterine body was removed and pedicles inspected and confirmed as adequately hemostatic Foley catheter was inserted and 600 cc of clear urine obtained. Pedicles were inspected again and were hemostatic adequately hemostatic. There was small amount of oozing from the cuff considered within normal limits. The Cheryll Cockayne were inspected and further confirmed as hemostatic before cutting the tagged sutures on the utero-ovarian pedicles ovaries could be visualized on the inferior surface, and were  grossly normal.. The brother broad cul-de-sac was reduced and transverse diameter by using a suture of 20 proline grasping the medial aspect of the uterosacral ligament just inside the uterosacral suture and sewing to the lateral 1 cm of the cuff on each side. Care was taken to sew from lateral to medial the itself and then be closed first by closing the peritoneum using running 2-0 chromic, then closing the cuff in a transverse fashion with a series of interrupted 0 chromic sutures the bladder support had been judged to be  significantly improved by the hysterectomy and cuff support, but a small intermittent-year-old long ellipse of the anterior vaginal epithelium over the bladder was then sharply incised in the midline after  infiltration with Marcaine with epinephrine, and sharply dissected off the underlying bladder difficulty, then pulled together in the midline with side-to-side sutures of 0 chromic. Posterior repair: Posterior perineal body was then grasped at 5 and 7:00 with Allis clamps at the hymen remnants, lateral to the old episiotomy repair the epithelium was trimmed transversely allowing identification of the connective tissue just beneath the vaginal epithelium. Marcaine was again injected in this area to assist with identification of cleavage planes and hemostasis. Vaginal epithelium was sharply dissected off of the underlying connective tissue on either side laterally for 2 cm and cephalad for 4-5 cm on either side. It appeared there was more tissue on the patient's right side and there was a parent pelvic floor defect on the left side, lateral to the rectum. Allis clamps could be used to grasp the stumps of the defect on the patient's left side, as well as identify the redundant tissue on the patient's right side which could be pulled across the midline and cephalad to fill the vaginal wall defect. A series of mattress sutures were, interrupted, 0 Vicryl were used to reapproximate this vaginal septum defect. During this entire procedure a double gloved right index finger of the surgery was kept in the rectum for proper positioning of the sutures and ensure that no penetration the rectum occurred. Upon completion of the placement sutures gloves were changed and the sutures tied down support was rechecked she'll sutures were placed using similar technique. The subsequent resolved was significantly improved rectovaginal septum support. Additional horizontal mattress sutures were placed in the perineal body [place] and the support, then vaginal epithelium closed in a 2 layer closure beginning at the apex of the defect sewing the subcutaneous tissues and a continuous running fashion down to the perineal body and then back  up using tickler vaginal epithelium closure. Sponge and needle counts were correct throughout and patient went to recovery room in stable condition after Betadine soaked vaginal packing was placed in the vagina. EBL 100 cc condition to recovery in stable

## 2015-06-11 ENCOUNTER — Encounter (HOSPITAL_COMMUNITY): Payer: Self-pay | Admitting: Obstetrics and Gynecology

## 2015-06-11 DIAGNOSIS — N812 Incomplete uterovaginal prolapse: Secondary | ICD-10-CM | POA: Diagnosis not present

## 2015-06-11 LAB — CBC
HCT: 24.9 % — ABNORMAL LOW (ref 36.0–46.0)
Hemoglobin: 8.2 g/dL — ABNORMAL LOW (ref 12.0–15.0)
MCH: 27.9 pg (ref 26.0–34.0)
MCHC: 32.9 g/dL (ref 30.0–36.0)
MCV: 84.7 fL (ref 78.0–100.0)
PLATELETS: 233 10*3/uL (ref 150–400)
RBC: 2.94 MIL/uL — AB (ref 3.87–5.11)
RDW: 15.3 % (ref 11.5–15.5)
WBC: 7.7 10*3/uL (ref 4.0–10.5)

## 2015-06-11 LAB — BASIC METABOLIC PANEL
BUN: 6 mg/dL (ref 6–20)
CO2: 29 mmol/L (ref 22–32)
Calcium: 7.7 mg/dL — ABNORMAL LOW (ref 8.9–10.3)
Chloride: 108 mmol/L (ref 101–111)
Creatinine, Ser: 0.68 mg/dL (ref 0.44–1.00)
Glucose, Bld: 95 mg/dL (ref 65–99)
POTASSIUM: 3.7 mmol/L (ref 3.5–5.1)
SODIUM: 138 mmol/L (ref 135–145)

## 2015-06-11 MED ORDER — IBUPROFEN 600 MG PO TABS: 600.0000 mg | ORAL_TABLET | Freq: Four times a day (QID) | ORAL | Status: DC | PRN

## 2015-06-11 MED ORDER — HYDROCODONE-ACETAMINOPHEN 5-325 MG PO TABS: 1.0000 | ORAL_TABLET | Freq: Four times a day (QID) | ORAL | Status: DC | PRN

## 2015-06-11 NOTE — Progress Notes (Signed)
1 Day Post-Op Procedure(s) (LRB): HYSTERECTOMY VAGINAL (N/A) ANTERIOR REPAIR (CYSTOCELE) (N/A) POSTERIOR REPAIR (RECTOCELE) (N/A)  Subjective: Patient reports incisional pain and tolerating PO.    Objective: I have reviewed patient's vital signs and labs. CBC    Component Value Date/Time   WBC 7.7 06/11/2015 0610   RBC 2.94* 06/11/2015 0610   HGB 8.2* 06/11/2015 0610   HCT 24.9* 06/11/2015 0610   PLT 233 06/11/2015 0610   MCV 84.7 06/11/2015 0610   MCH 27.9 06/11/2015 0610   MCHC 32.9 06/11/2015 0610   RDW 15.3 06/11/2015 0610   LYMPHSABS 3.7 11/19/2014 1449   MONOABS 0.7 11/19/2014 1449   EOSABS 0.1 11/19/2014 1449   BASOSABS 0.0 11/19/2014 1449    BMET    Component Value Date/Time   NA 138 06/11/2015 0610   K 3.7 06/11/2015 0610   CL 108 06/11/2015 0610   CO2 29 06/11/2015 0610   GLUCOSE 95 06/11/2015 0610   BUN 6 06/11/2015 0610   CREATININE 0.68 06/11/2015 0610   CALCIUM 7.7* 06/11/2015 0610   GFRNONAA >60 06/11/2015 0610   GFRAA >60 06/11/2015 0610     General: alert, cooperative and no distress Resp: clear to auscultation bilaterally GI: soft, non-tender; bowel sounds normal; no masses,  no organomegaly Vaginal Bleeding: minimal Vag pack d/c'd  Assessment: s/p Procedure(s): HYSTERECTOMY VAGINAL (N/A) ANTERIOR REPAIR (CYSTOCELE) (N/A) POSTERIOR REPAIR (RECTOCELE) (N/A): stable and tolerating diet  Plan: Discontinue IV fluids Discharge home  LOS: 1 day    , V 06/11/2015, 9:02 AM

## 2015-06-11 NOTE — Discharge Summary (Signed)
Physician Discharge Summary  Patient ID: JAYDY FITZHENRY MRN: [id number] DOB/AGE: 51-Nov-1967 51 y.o.  Admit date: 06/10/2015 Discharge date: 06/11/2015  Admission Diagnoses: Rectocele second-degree uterine descensus small cystocele  Discharge Diagnoses: Same Active Problems:   * No active hospital problems. *   Discharged Condition: good  Hospital Course: This 49 year old female was admitted for vaginal hysterectomy anterior and posterior repair performed 06/10/2015. She was admitted with mild anemia with hemoglobin 10.9 surgical procedure was uncomplicated. Postop hemoglobin drop is greater than expected but abdomen remained soft and patient did well CBC Latest Ref Rng 06/11/2015 06/05/2015 11/19/2014  WBC 4.0 - 10.5 K/uL 7.7 9.3 9.0  Hemoglobin 12.0 - 15.0 g/dL 8.2(L) 10.9(L) 11.3(L)  Hematocrit 36.0 - 46.0 % 24.9(L) 33.9(L) 34.8(L)  Platelets 150 - 400 K/uL 233 366 330   she was discharged home on postop day 1 in stable condition tolerating pain adequately. Foley catheter was removed as well as vaginal packing and patient will be discharged after voiding postsurgical instructions reviewed with patient and partner    Consults: None  Significant Diagnostic Studies: labs: BMP Latest Ref Rng 06/11/2015 06/05/2015 02/01/2014  Glucose 65 - 99 mg/dL 95 85 83  BUN 6 - 20 mg/dL 6 10 10   Creatinine 0.44 - 1.00 mg/dL 0.68 0.88 0.71  Sodium 135 - 145 mmol/L 138 135 140  Potassium 3.5 - 5.1 mmol/L 3.7 4.6 3.6(L)  Chloride 101 - 111 mmol/L 108 103 102  CO2 22 - 32 mmol/L 29 26 26   Calcium 8.9 - 10.3 mg/dL 7.7(L) 10.0 9.4     Treatments: surgery: Vaginal hysterectomy anterior posterior repair  Discharge Exam: Blood pressure 114/65, pulse 63, temperature 99.5 F (37.5 C), temperature source Oral, resp. rate 9, height 5\' 3"  (1.6 m), weight 121 lb (54.885 kg), last menstrual period 05/09/2015, SpO2 100 %. General appearance: alert, cooperative and no distress GI: soft, non-tender; bowel sounds  normal; no masses,  no organomegaly Pelvic: external genitalia normal and Vaginal packing discontinued, minimal fluid and minimal blood on packing Extremities: extremities normal, atraumatic, no cyanosis or edema and Homans sign is negative, no sign of DVT  Disposition: 01-Home or Self Care  Discharge Instructions    Diet - low sodium heart healthy    Complete by:  As directed      Increase activity slowly    Complete by:  As directed             Medication List    TAKE these medications        B-complex with vitamin C tablet  Take 1 tablet by mouth daily.     citalopram 40 MG tablet  Commonly known as:  CELEXA  Take 40 mg by mouth daily.     clonazePAM 1 MG tablet  Commonly known as:  KLONOPIN  Take 1 mg by mouth 3 (three) times daily as needed. Anxiety/depression.     DAILY VITAMIN PO  Take 1 tablet by mouth daily.     DRAMAMINE 50 MG tablet  Generic drug:  dimenhyDRINATE  Take 50 mg by mouth every 8 (eight) hours as needed. Upset stomach.     fexofenadine 180 MG tablet  Commonly known as:  ALLEGRA  Take 180 mg by mouth daily.     HYDROcodone-acetaminophen 5-325 MG per tablet  Commonly known as:  NORCO/VICODIN  Take 1 tablet by mouth every 6 (six) hours as needed for moderate pain or severe pain.     hydrocortisone-pramoxine 2.5-1 % rectal cream  Commonly known  as:  [facility]  Place 1 application rectally as needed for hemorrhoids.     hyoscyamine 0.375 MG 12 hr tablet  Commonly known as:  LEVBID  TAKE ONE TABLET BY MOUTH THREE TIMES DAILY BEFORE MEALS     ibuprofen 600 MG tablet  Commonly known as:  ADVIL,MOTRIN  Take 1 tablet (600 mg total) by mouth every 6 (six) hours as needed (mild pain).     LINZESS 290 MCG Caps capsule  Generic drug:  Linaclotide  Take 290 mcg by mouth daily.     NEXIUM 40 MG capsule  Generic drug:  esomeprazole  TAKE ONE CAPSULE TWICE DAILY     ondansetron 4 MG tablet  Commonly known as:  ZOFRAN  Take 1 tablet (4 mg  total) by mouth every 8 (eight) hours as needed for nausea or vomiting.     polyethylene glycol powder powder  Commonly known as:  GLYCOLAX/MIRALAX  TAKE 34 GRAMS (TWO CAPFULS) IN 8 OZ OF FLUID TWICE DAILY     SALINE NASAL SPRAY NA  Place 1 spray into the nose 4 (four) times daily as needed. Allergies.           Follow-up Information    Follow up with [name], MD In 1 week.   Specialties:  Obstetrics and Gynecology, Radiology   Why:  As scheduled, For wound re-check, Postoperative visit   Contact information:   [place] [address] [phone]       Signed: [name] 06/11/2015, 9:10 AM

## 2015-06-11 NOTE — Discharge Instructions (Signed)
Rectocele/Enterocele, Care After A woman's birth canal (vagina) can become weak or stretched. This can be caused by childbirth, heavy lifting, lasting (chronic) constipation, aging, or pelvic surgery. When the vagina is weak and stretched, parts of the intestine can bulge into the vagina by pushing against the vaginal walls. A rectocele is when the very end of the large intestine (rectum) causes the bulge. An enterocele is when the small intestine causes the bulge. Surgery to fix this problem is usually done through the vagina. If you just had this surgery, you were probably given a drug to make you sleep (general anesthetic) or a drug that numbs you from the waist down (spinal/epidural). Here is what happened:  The small intestine or rectum was pushed back to its normal place.  The vaginal wall was made stronger. Sometimes this is done with stitches or a mesh-like material. HOME CARE INSTRUCTIONS  Some women go home the same day as their surgery. Others stay in the hospital for a few days. This depends on the size and type of repair.  Pain and Medications  Some pain is normal after this surgery. Only take pain medicine your surgeon prescribed. Follow the directions carefully.  Do not take aspirin. It can cause bleeding.  Do not drink alcohol while taking pain medication.  You may be given a medicine (antibiotic) that kills germs. Follow the directions carefully.  Take warm sitz baths 2 times a day to control discomfort and reduce any swelling. Take sitz baths with your caregiver's permission. Diet  Go back to your normal eating as directed by your caregiver.  Drink a lot of fluids. Drink at least 6 glasses of water every day. Activity  Move around and walk as much as possible. This can keep blood clots from forming in your legs.  Do not climb stairs until your caregiver says it is okay.  Do not lift objects 5 pounds (2.3 kg) or heavier. Do not bend or strain for 6 to 8 weeks.  Do  not drive until after you stop taking pain medicine and your caregiver says it is okay.  Your return to work will depend on the type of work you do. Ask your caregiver what is best for you.  Ask your caregiver when you can resume sexual activity. Most women can start having sex in about 6 weeks after their surgery.  Get plenty of rest during the day and sleep at night.  Have someone help you with your household chores and activities for 3 to 4 weeks. Other Precautions  You may have some discharge from the vagina for a few weeks after the surgery. It may have small amounts of blood in it. This is normal. If you have questions, ask your caregiver.  Do not use tampons or douche.  You should be able to take a shower a day after your surgery. Do not take a tub bath for at least a week.  Take it easy for awhile. You should feel much better in 2 to 3 weeks. It may take up to 6 weeks to feel completely normal.  Keep all follow-up appointments.  Take your temperature twice a day and write it down.  Make sure your family understands everything about your surgery and recovery. SEEK MEDICAL CARE IF:   You have any questions about your medication, or you need stronger pain medication.  Pain continues, even after taking pain medication.  You become constipated.  You have an oral temperature above 102 F (38.9 C).    You develop swelling and redness in the surgery area.  You become dizzy or lightheaded.  You feel sick to your stomach (nauseous), throw up (vomit), or have diarrhea.  You develop a rash.  You have a reaction to your medications. SEEK IMMEDIATE MEDICAL CARE IF:   Pain gets worse.  You have new bleeding from your vagina.  Discharge from the vagina becomes heavy, or it has a bad smell.  You have an oral temperature above 102 F (38.9 C), not controlled by medicine.  You develop belly (abdominal) pain.  You develop chest pain.  You develop shortness of  breath.  You pass out (faint).  You develop pain, swelling, or redness in the leg.  You have pain or burning with urination.  You have bloody urine or cannot urinate. MAKE SURE YOU:   Understand these instructions.  Will watch your condition.  Will get help right away if you are not doing well or get worse. Document Released: 12/22/2009 Document Revised: 07/18/2013 Document Reviewed: 12/22/2009 Exit[employer] Patient Information 2015 [employer]. This information is not intended to replace advice given to you by your health care provider. Make sure you discuss any questions you have with your health care provider.  

## 2015-06-11 NOTE — Progress Notes (Signed)
NURSING PROGRESS NOTE  FLOWER FRANKO [id number] Discharge Data: 06/11/2015 11:38 AM Attending Provider: No att. providers found BBC:WUGQ,BVQXI B., MD   Dorthula Nettles to be D/C'd Home per MD order.    All IV's discontinued and monitored for bleeding.  All belongings returned to patient for patient to take home.  AVS summary and prescriptions reviewed with patient and spouse.  Patient left floor via wheelchair, escorted by NT.  Last Documented Vital Signs:  Blood pressure 114/65, pulse 63, temperature 99.5 F (37.5 C), temperature source Oral, resp. rate 9, height 5\' 3"  (1.6 m), weight 54.885 kg (121 lb), last menstrual period 05/09/2015, SpO2 100 %.  Cecilie Kicks D

## 2015-06-11 NOTE — Care Management Note (Signed)
Case Management Note  Patient Details  Name: AMINTA SAKURAI MRN: [id number] Date of Birth: Mar 07, 1966  Expected Discharge Date:   06/11/2015               Expected Discharge Plan:  Home/Self Care  In-House Referral:  NA  Discharge planning Services  CM Consult  Post Acute Care Choice:  NA Choice offered to:  NA  DME Arranged:    DME Agency:     HH Arranged:    [place] Agency:     Status of Service:  Completed, signed off  Medicare Important Message Given:    Date Medicare IM Given:    Medicare IM give by:    Date Additional Medicare IM Given:    Additional Medicare Important Message give by:     If discussed at [place] of Stay Meetings, dates discussed:    Additional Comments: Pt is from home, lives with her husband and is independent at baseline. Pt discharging home today with self care. No CM needs noted.  Sherald Barge, RN 06/11/2015, 9:47 AM

## 2015-06-12 ENCOUNTER — Telehealth: Payer: Self-pay | Admitting: Obstetrics and Gynecology

## 2015-06-12 NOTE — Telephone Encounter (Signed)
Pt states she was reading her discharge papers and noted she was to drink 6/8 glasses of water a day. Pt state does it have to be water. Informed pt perferably water. Pt verbalized understanding.

## 2015-06-16 LAB — TYPE AND SCREEN
ABO/RH(D): O POS
ANTIBODY SCREEN: NEGATIVE
UNIT DIVISION: 0
Unit division: 0

## 2015-06-17 ENCOUNTER — Encounter: Payer: BLUE CROSS/BLUE SHIELD | Admitting: Obstetrics and Gynecology

## 2015-06-18 ENCOUNTER — Ambulatory Visit (INDEPENDENT_AMBULATORY_CARE_PROVIDER_SITE_OTHER): Payer: BLUE CROSS/BLUE SHIELD | Admitting: Obstetrics and Gynecology

## 2015-06-18 ENCOUNTER — Encounter: Payer: Self-pay | Admitting: Obstetrics and Gynecology

## 2015-06-18 VITALS — BP 120/58 | Ht 63.0 in | Wt 121.0 lb

## 2015-06-18 DIAGNOSIS — Z9889 Other specified postprocedural states: Secondary | ICD-10-CM

## 2015-06-18 DIAGNOSIS — Z09 Encounter for follow-up examination after completed treatment for conditions other than malignant neoplasm: Secondary | ICD-10-CM | POA: Insufficient documentation

## 2015-06-18 MED ORDER — METRONIDAZOLE 0.75 % VA GEL: 1.0000 | Freq: Every day | VAGINAL | Status: DC

## 2015-06-18 MED ORDER — HYDROCODONE-ACETAMINOPHEN 5-325 MG PO TABS
1.0000 | ORAL_TABLET | Freq: Four times a day (QID) | ORAL | Status: DC | PRN
Start: 2015-06-18 — End: 2016-03-05

## 2015-06-18 NOTE — Progress Notes (Signed)
This chart was scribed for Marie Kind, MD by Erling Conte, ED Scribe. This patient was seen in room 1 and the patient's care was started at 2:36 PM.   Subjective:  Marie Hurley is a 49 y.o. female now 1 weeks status post vaginal hysterectomy.    Review of Systems Negative except still having mild bleeding and pain. She denies any warmth to incision site. She denies any fevers.   Diet:   normal   Bowel movements : normal.  Pain is mostly controlled with current analgesics. Medications being used: narcotic analgesics hydrocodone/acetaminophen (Lorcet, Lortab, Norco, Vicodin). Using 3-4x per day every 4-6 hours  Objective:  BP 120/58 mmHg  Ht 5\' 3"  (1.6 m)  Wt 121 lb (54.885 kg)  BMI 21.44 kg/m2  LMP 05/27/2015 General:Well developed, well nourished.  No acute distress. Abdomen: Bowel sounds normal, soft, non-tender. Pelvic Exam:    External Genitalia:  Normal.    Vagina: small 1cm bubble at end of posterior repair, possible subcutaneous hematoma. Will allow to     heal spontaneously. Good posterior support. Vaginal wall good support. Moderate vaginal discharge    Cervix: surgically absent    Uterus: surgically removed    Adnexa/Bimanual: deferred    Rectal: pt refused  Incision(s):   Healing fairly well, no drainage, no erythema, no hernia, no swelling, no dehiscence,     Assessment:  Post-Op 1 weeks s/p vaginal hysterectomy    Doing well postoperatively.   Plan:  1.Wound care discussed   2. current medications: continue hydrocodone and alternating Ibuprofen and rx Metrogel 3. Activity restrictions: no lifting more than 15 pounds. Not sexually active 4. return to work: not applicable. 5. Follow up in 3  weeks.  I personally performed the services described in this documentation, which was SCRIBED in my presence. The recorded information has been reviewed and considered accurate. It has been edited as necessary during review. Marie Kind, MD

## 2015-06-18 NOTE — Progress Notes (Signed)
Patient ID: Marie Hurley, female   DOB: 08-12-1966, 49 y.o.   MRN: [id number] Pt here today for post op visit. Pt states that she is still bleeding. Pt states that she is very sore down there and she knows that is to be expected.

## 2015-06-18 NOTE — Addendum Note (Signed)
Addended by: [name] on: 06/18/2015 03:05 PM   Modules accepted: Orders

## 2015-06-23 ENCOUNTER — Telehealth: Payer: Self-pay | Admitting: Obstetrics and Gynecology

## 2015-06-23 NOTE — Telephone Encounter (Signed)
Pt needs to continue using whatever amount of stool softener it takes to stay loose. If she needs to be seen, have her come Tues pm.

## 2015-06-23 NOTE — Telephone Encounter (Signed)
Pt states that she is still having pain and issues with constipation. Pt states that she has a lot of pain when she has a BM. Pt states that it feels like her rectum is ripping open and she is having some abdominal pain. Pt states that she takes a stool softener and miralax daily. Pt states that pain is better if the stool is loose. Pt states that she is also using a sits bath and heating pad as advised.

## 2015-06-24 ENCOUNTER — Other Ambulatory Visit (INDEPENDENT_AMBULATORY_CARE_PROVIDER_SITE_OTHER): Payer: Self-pay | Admitting: Internal Medicine

## 2015-06-24 NOTE — Telephone Encounter (Signed)
Pt advised to continue doing what she is doing per Dr. Glo Herring. Pt was also advised that if she thinks she should be seen we could see her this afternoon. Pt stated that she doesn't think that there's anything different that he could do to help with the discomfort. I advised the pt that Dr.Ferguson would be out of the office starting tomorrow and going through to the 23rd. Pt verbalized understanding.

## 2015-06-30 ENCOUNTER — Telehealth: Payer: Self-pay | Admitting: Obstetrics and Gynecology

## 2015-06-30 NOTE — Telephone Encounter (Signed)
Pt states that she has IBS and migraines. Pt states that she just doesn't know what the norm is. Pt states that since she has had the surgery the constipation has been worse than her usually. Pt states that she will go a day or two without a BM. I advised the pt to get a stool softener and to start taking that to see if that helps with the constipation. Pt states that if she gets impacted can she take care of the problem at home or should she be seen. Pt was advised that she would need to come in the office to be seen instead of taking care of it herself. Pt also advised to push her fluids and make sure she is drinking plenty of water.

## 2015-07-07 ENCOUNTER — Ambulatory Visit (HOSPITAL_COMMUNITY): Payer: Self-pay

## 2015-07-07 ENCOUNTER — Ambulatory Visit (INDEPENDENT_AMBULATORY_CARE_PROVIDER_SITE_OTHER): Payer: Self-pay | Admitting: Internal Medicine

## 2015-07-09 ENCOUNTER — Ambulatory Visit (INDEPENDENT_AMBULATORY_CARE_PROVIDER_SITE_OTHER): Payer: BLUE CROSS/BLUE SHIELD | Admitting: Obstetrics and Gynecology

## 2015-07-09 ENCOUNTER — Telehealth: Payer: Self-pay | Admitting: Obstetrics and Gynecology

## 2015-07-09 VITALS — BP 110/60 | Ht 63.0 in | Wt 121.0 lb

## 2015-07-09 DIAGNOSIS — Z90710 Acquired absence of both cervix and uterus: Secondary | ICD-10-CM

## 2015-07-09 DIAGNOSIS — Z09 Encounter for follow-up examination after completed treatment for conditions other than malignant neoplasm: Secondary | ICD-10-CM

## 2015-07-09 DIAGNOSIS — Z9889 Other specified postprocedural states: Secondary | ICD-10-CM

## 2015-07-09 NOTE — Telephone Encounter (Signed)
Pt states that she went to [facility] and was told they only do internal hemorrhoids. Pt states that she showed the receptionist a picture on her phone and is not sure she wants them to do this for her because it is not something they do everyday. Pt wants Dr. Glo Herring to send her somewhere else that does them everyday not just here and there.   Pt aware that Dr.Ferguson is out of the office tomorrow but I will send this message to him and discuss it with him and call her back with what he suggests. Pt verbalized understanding.

## 2015-07-09 NOTE — Progress Notes (Signed)
Patient ID: Marie Hurley, female   DOB: Feb 13, 1966, 49 y.o.   MRN: [id number] Pt here today for post op visit. Pt states that she has had sius symptoms for the past couple weeks and wants Dr. Glo Herring to check her for this. Pt wants to also discuss being referred for hemorrhoid surgery.

## 2015-07-09 NOTE — Progress Notes (Signed)
Patient ID: Marie Hurley, female   DOB: 10/16/1965, 49 y.o.   MRN: [id number]   By signing my name below, I, Erling Conte, attest that this documentation has been prepared under the direction and in the presence of [name], MD. Electronically Signed: Erling Conte, ED Scribe. 07/09/2015. 1:53 PM.   Subjective:  Marie Hurley is a 49 y.o. female now 4 weeks status post vaginal hysterectomy  Pt here today for post op visit. Pt states that she has had uti symptoms for the past couple weeks and wants Dr. Glo Herring to check her for this. Pt wants to also discuss being referred for hemorrhoid surgery. Pt states she has been having urgency with bowel movements and has had mild bowel incontinence, she is on stool softeners.  Review of Systems Negative except for above   Diet:   normal   Bowel movements : normal.  The patient is not having any pain.  Objective:  BP 110/60 mmHg  Ht 5\' 3"  (1.6 m)  Wt 121 lb (54.885 kg)  BMI 21.44 kg/m2  LMP 05/27/2015 General:Well developed, well nourished.  No acute distress. Abdomen: Bowel sounds normal, soft, non-tender. Pelvic Exam:    External Genitalia:  Normal.    Vagina: Good posterior support. Vaginal wall good support.     Cervix: surgically absent    Uterus: surgically absent    Adnexa/Bimanual: Normal with very good anterior and posterior wall support  Incision(s):   Healing well, no drainage, no erythema, no hernia, no swelling, no dehiscence,     Assessment:  Post-Op 4 weeks s/p vaginal hysterectomy    Doing well postoperatively.   Plan:  1.Wound care discussed   2. . current medications---reduce stool softener, to keep stool soft but controlled 3. Activity restrictions: none can resume sexual activity, cautiously 4. return to work: not applicable. 5. Follow up in 1 year. 6 will refer tO GI if pt willing given name of Dr Oneida Alar  I personally performed the services described in this documentation, which was SCRIBED in  my presence. The recorded information has been reviewed and considered accurate. It has been edited as necessary during review. [name], MD

## 2015-07-14 ENCOUNTER — Telehealth: Payer: Self-pay | Admitting: Obstetrics and Gynecology

## 2015-07-16 ENCOUNTER — Other Ambulatory Visit (INDEPENDENT_AMBULATORY_CARE_PROVIDER_SITE_OTHER): Payer: Self-pay | Admitting: Internal Medicine

## 2015-07-16 ENCOUNTER — Telehealth: Payer: Self-pay | Admitting: *Deleted

## 2015-07-16 NOTE — Telephone Encounter (Signed)
Pt was advised that I had sent the message to Dr. Glo Herring and that I would send this message as well. Pt verbalized understanding.

## 2015-07-17 ENCOUNTER — Encounter (INDEPENDENT_AMBULATORY_CARE_PROVIDER_SITE_OTHER): Payer: Self-pay | Admitting: Internal Medicine

## 2015-07-17 ENCOUNTER — Ambulatory Visit (INDEPENDENT_AMBULATORY_CARE_PROVIDER_SITE_OTHER): Payer: BLUE CROSS/BLUE SHIELD | Admitting: Internal Medicine

## 2015-07-17 ENCOUNTER — Ambulatory Visit (HOSPITAL_COMMUNITY)
Admission: RE | Admit: 2015-07-17 | Discharge: 2015-07-17 | Disposition: A | Discharge status: Home / Self Care | Payer: BLUE CROSS/BLUE SHIELD | Source: Ambulatory Visit | Attending: Internal Medicine | Admitting: Internal Medicine

## 2015-07-17 ENCOUNTER — Telehealth: Payer: Self-pay | Admitting: *Deleted

## 2015-07-17 VITALS — BP 102/68 | HR 74 | Temp 98.8°F | Resp 18 | Ht 63.0 in | Wt 120.0 lb

## 2015-07-17 DIAGNOSIS — R109 Unspecified abdominal pain: Secondary | ICD-10-CM

## 2015-07-17 DIAGNOSIS — K5900 Constipation, unspecified: Secondary | ICD-10-CM

## 2015-07-17 DIAGNOSIS — Z1231 Encounter for screening mammogram for malignant neoplasm of breast: Secondary | ICD-10-CM | POA: Diagnosis present

## 2015-07-17 DIAGNOSIS — K643 Fourth degree hemorrhoids: Secondary | ICD-10-CM | POA: Diagnosis not present

## 2015-07-17 DIAGNOSIS — K5909 Other constipation: Secondary | ICD-10-CM

## 2015-07-17 DIAGNOSIS — K219 Gastro-esophageal reflux disease without esophagitis: Secondary | ICD-10-CM

## 2015-07-17 NOTE — Progress Notes (Signed)
Presenting complaint;  Follow-up for abdominal pain and GERD. Patient complains of enlarging hemorrhoids.  Subjective:  She does 49 year old Caucasian female who is here for scheduled visit. She was last seen on 12/11/2013. She states heartburns well controlled with therapy. She continues to have frequent epigastric and right upper quadrant pain. She remains with episodes where she has intense right upper quadrant pain. His pain is not associated with fever or chills. Now she complains of frequent if not daily rectal bleeding. She complains of enlarging hemorrhoids. She is having discomfort and itching. She states her bowels move 2-3 times every morning. And some days she does not take second toes of polyethylene glycol. She had vaginal hysterectomy with anterior and posterior repair on 06/10/2015 by Dr. Glo Herring. She did require 2 units of PRBCs postop. She is on Zithromax for sinusitis.    Current Medications: Outpatient Encounter Prescriptions as of 07/17/2015  Medication Sig  . azithromycin (ZITHROMAX) 250 MG tablet Take 250 mg by mouth daily.   . B Complex-C (B-COMPLEX WITH VITAMIN C) tablet Take 1 tablet by mouth daily.  . citalopram (CELEXA) 40 MG tablet Take 40 mg by mouth daily.  . clonazePAM (KLONOPIN) 1 MG tablet Take 1 mg by mouth 3 (three) times daily as needed. Anxiety/depression.  . dimenhyDRINATE (DRAMAMINE) 50 MG tablet Take 50 mg by mouth every 8 (eight) hours as needed. Upset stomach.  . esomeprazole (NEXIUM) 40 MG capsule TAKE ONE CAPSULE TWICE DAILY  . fexofenadine (ALLEGRA) 180 MG tablet Take 180 mg by mouth daily.  Marland Kitchen HYDROcodone-acetaminophen (NORCO/VICODIN) 5-325 MG per tablet Take 1 tablet by mouth every 6 (six) hours as needed for moderate pain or severe pain. postoperative  . hydrocortisone-pramoxine (ANALPRAM-HC) 2.5-1 % rectal cream Place 1 application rectally as needed for hemorrhoids.   . hyoscyamine (LEVBID) 0.375 MG 12 hr tablet TAKE ONE TABLET BY MOUTH THREE  TIMES DAILY BEFORE MEALS  . ibuprofen (ADVIL,MOTRIN) 600 MG tablet Take 1 tablet (600 mg total) by mouth every 6 (six) hours as needed (mild pain).  Marland Kitchen LINZESS 290 MCG CAPS capsule Take 290 mcg by mouth daily.  . Multiple Vitamin (DAILY VITAMIN PO) Take 1 tablet by mouth daily.   . ondansetron (ZOFRAN) 4 MG tablet Take 1 tablet (4 mg total) by mouth every 8 (eight) hours as needed for nausea or vomiting.  . polyethylene glycol powder (GLYCOLAX/MIRALAX) powder TAKE 34 GRAMS (TWO CAPFULS) IN 8 OZ OF FLUID TWICE DAILY  . SALINE NASAL SPRAY NA Place 1 spray into the nose 4 (four) times daily as needed. Allergies.  . [DISCONTINUED] metroNIDAZOLE (METROGEL) 0.75 % vaginal gel Place 1 Applicatorful vaginally at bedtime. Apply  One half applicatorful to vagina at bedtime for 5 days (Patient not taking: Reported on 07/09/2015)   No facility-administered encounter medications on file as of 07/17/2015.     Objective: Blood pressure 102/68, pulse 74, temperature 98.8 F (37.1 C), temperature source Oral, resp. rate 18, height 5\' 3"  (1.6 m), weight 120 lb (54.432 kg), last menstrual period 05/27/2015. Patient is alert and in no acute distress. Conjunctiva is pink. Sclera is nonicteric Oropharyngeal mucosa is normal. No neck masses or thyromegaly noted. Cardiac exam with regular rhythm normal S1 and S2. No murmur or gallop noted. Lungs are clear to auscultation. Abdomen is symmetrical. Bowel sounds are normal. On palpation abdomen is soft with mild tenderness in epigastrium and right upper quadrant. No organomegaly or masses. Rectal examination deferred as hemorrhoids are well documented on pictures that she has taken  herself with her cell phone. No LE edema or clubbing noted.  Labs/studies Results: CBC from 06/11/2015  H&H 8.2 and 24.9 (postop following hysterectomy ) Patient received 2 units of PRBCs but did not have posttransfusion H&H.   Assessment:  #1. Fourth degree hemorrhoids. She is bleeding  frequently if not daily. She will need surgical intervention and will be referred to Dr. Cheryll Cockayne of [facility]. She underwent colonoscopy in April last year and documented have external hemorrhoids. #2. Chronic right upper quadrant abdominal pain. She has history of SOD dysfunction. First ERCP few years ago provided relief but she did not get any benefit with 2 subsequent ERCPs one of which was at [facility]. LFTs have remained normal with worsening episodes of this pain and I suspect it may be due to IBS.  #3. Chronic epigastric pain most likely secondary to dyspepsia #4. Chronic constipation. She is requiring high-dose polyethylene glycol as well as Linzess to make her bowels move. #5. Chronic GERD. Heartburn is well controlled with therapy. #6. Anemia. Hemoglobin prior to hysterectomy was 10.9 and it dropped to 8.2. She was given 2 units of PRBCs but did not have posttransfusion H&H. Clinically she does not appear to be anemic.  Plan:  FDgard 1-2 capsules by mouth twice a day. Samples provided to the patient. This is an OTC preparation and if it helps she can buy over-the-counter. Referral to Dr. Cheryll Cockayne of [facility] for evaluation and treatment of hemorrhoids.  She will need CBC which she can have prior to hemorrhoid surgery. Office visit in one year.

## 2015-07-17 NOTE — Patient Instructions (Addendum)
Surgical consult to be arranged. FDgard 1 to 2 capsules by mouth twice daily.

## 2015-07-19 DIAGNOSIS — Z90710 Acquired absence of both cervix and uterus: Secondary | ICD-10-CM | POA: Insufficient documentation

## 2015-07-19 NOTE — Telephone Encounter (Signed)
Pt has made her own arrangements  With [facility]. For followup

## 2015-07-19 NOTE — Telephone Encounter (Signed)
Pt has made her own arrangements  With [facility]. For followup

## 2015-07-19 NOTE — Telephone Encounter (Signed)
Pt informed that I trust Dr Olevia Perches judgment on referral, as I have little knowledge of the referral doctor.

## 2015-07-19 NOTE — Telephone Encounter (Signed)
Pt has made her own arrangements  With [facility]. For followup

## 2015-07-28 ENCOUNTER — Telehealth: Payer: Self-pay | Admitting: Cardiology

## 2015-07-28 NOTE — Telephone Encounter (Signed)
Pt states that her depression has gotten worse and is unsure why. Pt states that she has been having some pain on her right side at here rectocele. Pt is scheduled for a hemorrhoidectomy within the next few weeks. Pt states that she doesn't want to go outside. Pt states that she wants to know if that could be a side effect of the hysterectomy. Pt was advised to make an appointment to see Dr. Glo Herring and let him evaluate the pain and depression issues. Phone call was switched up front and appointment was made.

## 2015-07-30 ENCOUNTER — Ambulatory Visit: Payer: BLUE CROSS/BLUE SHIELD | Admitting: Obstetrics and Gynecology

## 2015-08-07 DIAGNOSIS — K643 Fourth degree hemorrhoids: Secondary | ICD-10-CM | POA: Insufficient documentation

## 2015-09-23 ENCOUNTER — Telehealth (INDEPENDENT_AMBULATORY_CARE_PROVIDER_SITE_OTHER): Payer: Self-pay | Admitting: *Deleted

## 2015-09-23 NOTE — Telephone Encounter (Signed)
Patient has questions about her insurance covered and changing for Levsid.  She is wondering what else she can take.

## 2015-09-26 NOTE — Telephone Encounter (Signed)
Patient was called and ask to call her [employer] and see if Dicyclomine is covered on this new policy for 0000000.  She will call and let us know.

## 2015-10-01 ENCOUNTER — Other Ambulatory Visit (INDEPENDENT_AMBULATORY_CARE_PROVIDER_SITE_OTHER): Payer: Self-pay | Admitting: *Deleted

## 2015-10-01 MED ORDER — DICYCLOMINE HCL 20 MG PO TABS: 20.0000 mg | ORAL_TABLET | Freq: Three times a day (TID) | ORAL | Status: DC

## 2015-10-01 NOTE — Telephone Encounter (Signed)
Patient's insurance will not pay for Levbid any longer or after the first of the year. Insurance will cover Dicyclomine 20 mg. A Rx has been sent to the patient's pharmacy.

## 2015-10-15 ENCOUNTER — Telehealth (INDEPENDENT_AMBULATORY_CARE_PROVIDER_SITE_OTHER): Payer: Self-pay | Admitting: Internal Medicine

## 2015-10-15 NOTE — Telephone Encounter (Signed)
Patient was called ,samples are ready. She will pick up 10/15/14.

## 2015-10-15 NOTE — Telephone Encounter (Signed)
Marie Hurley called saying with the new year her deductible is high and she's wondering if we have any samples of Linzess to give her. She'd like a phone call regarding this.   Pt's ph# [phone] Thank you.

## 2015-11-07 ENCOUNTER — Other Ambulatory Visit (INDEPENDENT_AMBULATORY_CARE_PROVIDER_SITE_OTHER): Payer: Self-pay | Admitting: Internal Medicine

## 2015-11-12 ENCOUNTER — Other Ambulatory Visit (INDEPENDENT_AMBULATORY_CARE_PROVIDER_SITE_OTHER): Payer: Self-pay | Admitting: Internal Medicine

## 2015-12-04 ENCOUNTER — Ambulatory Visit: Payer: BLUE CROSS/BLUE SHIELD | Admitting: Obstetrics and Gynecology

## 2015-12-11 ENCOUNTER — Ambulatory Visit: Payer: BLUE CROSS/BLUE SHIELD | Admitting: Obstetrics and Gynecology

## 2015-12-15 ENCOUNTER — Ambulatory Visit: Payer: BLUE CROSS/BLUE SHIELD | Admitting: Obstetrics and Gynecology

## 2016-03-01 ENCOUNTER — Emergency Department (HOSPITAL_COMMUNITY)
Admission: EM | Admit: 2016-03-01 | Discharge: 2016-03-02 | Disposition: A | Discharge status: Other Acute Inpatient Hospital | Payer: BLUE CROSS/BLUE SHIELD | Attending: Emergency Medicine | Admitting: Emergency Medicine

## 2016-03-01 ENCOUNTER — Encounter (HOSPITAL_COMMUNITY): Payer: Self-pay | Admitting: *Deleted

## 2016-03-01 DIAGNOSIS — Z79891 Long term (current) use of opiate analgesic: Secondary | ICD-10-CM | POA: Diagnosis not present

## 2016-03-01 DIAGNOSIS — F10120 Alcohol abuse with intoxication, uncomplicated: Secondary | ICD-10-CM | POA: Diagnosis not present

## 2016-03-01 DIAGNOSIS — F10920 Alcohol use, unspecified with intoxication, uncomplicated: Secondary | ICD-10-CM

## 2016-03-01 DIAGNOSIS — Z79899 Other long term (current) drug therapy: Secondary | ICD-10-CM | POA: Diagnosis not present

## 2016-03-01 DIAGNOSIS — M1990 Unspecified osteoarthritis, unspecified site: Secondary | ICD-10-CM | POA: Diagnosis not present

## 2016-03-01 DIAGNOSIS — F329 Major depressive disorder, single episode, unspecified: Secondary | ICD-10-CM | POA: Insufficient documentation

## 2016-03-01 DIAGNOSIS — R45851 Suicidal ideations: Secondary | ICD-10-CM | POA: Diagnosis not present

## 2016-03-01 DIAGNOSIS — Z046 Encounter for general psychiatric examination, requested by authority: Secondary | ICD-10-CM | POA: Diagnosis present

## 2016-03-01 LAB — RAPID URINE DRUG SCREEN, HOSP PERFORMED
AMPHETAMINES: NOT DETECTED
Barbiturates: NOT DETECTED
Benzodiazepines: POSITIVE — AB
COCAINE: NOT DETECTED
OPIATES: POSITIVE — AB
TETRAHYDROCANNABINOL: NOT DETECTED

## 2016-03-01 LAB — CBC WITH DIFFERENTIAL/PLATELET
Basophils Absolute: 0.1 10*3/uL (ref 0.0–0.1)
Basophils Relative: 1 %
EOS ABS: 0.5 10*3/uL (ref 0.0–0.7)
Eosinophils Relative: 7 %
HCT: 33 % — ABNORMAL LOW (ref 36.0–46.0)
HEMOGLOBIN: 10.5 g/dL — AB (ref 12.0–15.0)
LYMPHS ABS: 3.2 10*3/uL (ref 0.7–4.0)
LYMPHS PCT: 44 %
MCH: 24.8 pg — AB (ref 26.0–34.0)
MCHC: 31.8 g/dL (ref 30.0–36.0)
MCV: 78 fL (ref 78.0–100.0)
MONOS PCT: 4 %
Monocytes Absolute: 0.3 10*3/uL (ref 0.1–1.0)
NEUTROS PCT: 44 %
Neutro Abs: 3.2 10*3/uL (ref 1.7–7.7)
Platelets: 354 10*3/uL (ref 150–400)
RBC: 4.23 MIL/uL (ref 3.87–5.11)
RDW: 17.5 % — ABNORMAL HIGH (ref 11.5–15.5)
WBC: 7.3 10*3/uL (ref 4.0–10.5)

## 2016-03-01 LAB — HEPATIC FUNCTION PANEL
ALK PHOS: 49 U/L (ref 38–126)
ALT: 14 U/L (ref 14–54)
AST: 24 U/L (ref 15–41)
Albumin: 4.2 g/dL (ref 3.5–5.0)
BILIRUBIN TOTAL: 0.3 mg/dL (ref 0.3–1.2)
Total Protein: 8.2 g/dL — ABNORMAL HIGH (ref 6.5–8.1)

## 2016-03-01 LAB — BASIC METABOLIC PANEL
Anion gap: 8 (ref 5–15)
BUN: 6 mg/dL (ref 6–20)
CHLORIDE: 103 mmol/L (ref 101–111)
CO2: 26 mmol/L (ref 22–32)
CREATININE: 0.78 mg/dL (ref 0.44–1.00)
Calcium: 9.2 mg/dL (ref 8.9–10.3)
GFR calc Af Amer: >60 mL/min (ref >60)
GFR calc non Af Amer: >60 mL/min (ref >60)
GLUCOSE: 85 mg/dL (ref 65–99)
POTASSIUM: 3.6 mmol/L (ref 3.5–5.1)
SODIUM: 137 mmol/L (ref 135–145)

## 2016-03-01 LAB — SALICYLATE LEVEL

## 2016-03-01 LAB — ETHANOL: Alcohol, Ethyl (B): 151 mg/dL — ABNORMAL HIGH (ref <5)

## 2016-03-01 LAB — ACETAMINOPHEN LEVEL

## 2016-03-01 MED ORDER — ACETAMINOPHEN 325 MG PO TABS
650.0000 mg | ORAL_TABLET | Freq: Once | ORAL | Status: AC
  Administered 2016-03-01: 650 mg via ORAL
  Filled 2016-03-01: qty 2

## 2016-03-01 MED ORDER — THIAMINE HCL 100 MG/ML IJ SOLN: 100.0000 mg | Freq: Every day | INTRAMUSCULAR | Status: DC

## 2016-03-01 MED ORDER — CLONAZEPAM 0.5 MG PO TABS: 1.0000 mg | ORAL_TABLET | Freq: Three times a day (TID) | ORAL | Status: DC | PRN

## 2016-03-01 MED ORDER — ONDANSETRON HCL 4 MG PO TABS
4.0000 mg | ORAL_TABLET | Freq: Three times a day (TID) | ORAL | Status: DC | PRN
  Administered 2016-03-01: 4 mg via ORAL
  Filled 2016-03-01: qty 1

## 2016-03-01 MED ORDER — CITALOPRAM HYDROBROMIDE 40 MG PO TABS
40.0000 mg | ORAL_TABLET | Freq: Every day | ORAL | Status: DC
Start: 2016-03-01 — End: 2016-03-02

## 2016-03-01 MED ORDER — LORAZEPAM 1 MG PO TABS: 0.0000 mg | ORAL_TABLET | Freq: Four times a day (QID) | ORAL | Status: DC

## 2016-03-01 MED ORDER — VITAMIN B-1 100 MG PO TABS: 100.0000 mg | ORAL_TABLET | Freq: Every day | ORAL | Status: DC

## 2016-03-01 MED ORDER — LORAZEPAM 1 MG PO TABS: 0.0000 mg | ORAL_TABLET | Freq: Two times a day (BID) | ORAL | Status: DC

## 2016-03-01 MED ORDER — PANTOPRAZOLE SODIUM 40 MG PO TBEC: 40.0000 mg | DELAYED_RELEASE_TABLET | Freq: Every day | ORAL | Status: DC

## 2016-03-01 NOTE — ED Provider Notes (Signed)
CSN: [id number]     Arrival date & time 03/01/16  1950 History   First MD Initiated Contact with Patient 03/01/16 2019     Chief Complaint  Patient presents with  . V70.1     (Consider location/radiation/quality/duration/timing/severity/associated sxs/prior Treatment) HPI Comments: Patient brought in by the police after threatening her husband with a gun as well as holding onto her own head. She states she had an argument with her husband but will not elaborate on the details. Police were called by neighbors. She admits to drinking 3 drinks tonight. Denies any illicit drug abuse. She denies any current suicidal thoughts. She denies any hallucinations. Patient states she was trying to scare her husband but not actually threatened to kill him. She denies any physical altercation.  The history is provided by the patient and the police.    Past Medical History  Diagnosis Date  . Bowel obstruction ([place])   . IBS (irritable bowel syndrome)   . GERD (gastroesophageal reflux disease)   . Migraine   . Basal cell carcinoma of chest wall 2015    rIGHT IN THE MIDDLE OF THE PATIENT'S CHEST  . Seasonal allergies   . Arthritis   . Depression   . Anxiety   . PONV (postoperative nausea and vomiting)   . Hx MRSA infection 2008    multiple skin lesions  . Complete rotator cuff tear 05/24/2014  . 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 Anemia    Past Surgical History  Procedure Laterality Date  . Tubal ligation    . Carpal tunnel release      rt  . Ercp  2005,2007,2013    several  . Dilation and curettage of uterus  '91 & '96  . Colonoscopy with propofol N/A 02/07/2014    Procedure: COLONOSCOPY WITH PROPOFOL;  Surgeon: Rogene Houston, MD;  Location: [facility];  Service: Endoscopy;  Laterality: N/A;  entered cecum @ 0756 ; total cecal withdrawal time- 9 min  . Diagnostic laparoscopy  2007    exploratory-nic bowel duct  . Cholecystectomy  2002    lap choli  . Colonoscopy    .  Shoulder arthroscopy with subacromial decompression, rotator cuff repair and bicep tendon repair Right 05/24/2014    Procedure: RIGHT SHOULDER ARTHROSCOPY DEBRIDEMENT LIMITED, DECOMPRESSION SUBACROMIAL PARTIAL ACROMIOPLASTY WITH ROTATOR CUFF REPAIR;  Surgeon: Johnny Bridge, MD;  Location: [facility];  Service: Orthopedics;  Laterality: Right;  . Vaginal hysterectomy N/A 06/10/2015    Procedure: HYSTERECTOMY VAGINAL;  Surgeon: [name], MD;  Location: [facility];  Service: Gynecology;  Laterality: N/A;  . Cystocele repair N/A 06/10/2015    Procedure: ANTERIOR REPAIR (CYSTOCELE);  Surgeon: [name], MD;  Location: [facility];  Service: Gynecology;  Laterality: N/A;  . Rectocele repair N/A 06/10/2015    Procedure: POSTERIOR REPAIR (RECTOCELE);  Surgeon: [name], MD;  Location: [facility];  Service: Gynecology;  Laterality: N/A;  . Abdominal hysterectomy    . Hemorroidectomy     Family History  Problem Relation Age of Onset  . Hypertension Mother   . Heart disease Mother   . COPD Mother   . Heart disease Father   . Diabetes Father   . Hypertension Father   . Heart disease Sister   . Healthy Son   . Healthy Daughter    Social History  Substance Use Topics  . Smoking status: Never Smoker   . Smokeless tobacco:  Never Used  . Alcohol Use: 0.6 oz/week    1 Standard drinks or equivalent per week     Comment: rare   OB History    No data available     Review of Systems  Constitutional: Negative for fever, activity change and appetite change.  HENT: Negative for congestion.   Eyes: Negative for visual disturbance.  Respiratory: Negative for cough, chest tightness and shortness of breath.   Cardiovascular: Negative for chest pain.  Gastrointestinal: Negative for nausea, vomiting and abdominal pain.  Genitourinary: Negative for dysuria, hematuria, vaginal bleeding and vaginal discharge.  Musculoskeletal: Negative for myalgias and arthralgias.  Skin: Negative for  rash.  Neurological: Negative for dizziness.  Psychiatric/Behavioral: Positive for suicidal ideas, behavioral problems, sleep disturbance, self-injury and decreased concentration.  A complete 10 system review of systems was obtained and all systems are negative except as noted in the HPI and PMH.      Allergies  Penicillins and Promethazine hcl  Home Medications   Prior to Admission medications   Medication Sig Start Date End Date Taking? Authorizing Provider  cetirizine (ZYRTEC) 10 MG tablet Take 10 mg by mouth daily.   Yes Historical Provider, MD  citalopram (CELEXA) 40 MG tablet Take 40 mg by mouth at bedtime.    Yes Historical Provider, MD  clindamycin (CLEOCIN) 150 MG capsule TAKE ONE CAPSULE BY MOUTH THREE TIMES DAILY FOR 10 DAYS STARTING ON 02/26/2016 02/26/16  Yes Historical Provider, MD  clonazePAM (KLONOPIN) 1 MG tablet Take 1 mg by mouth 3 (three) times daily as needed. Anxiety/depression.   Yes Historical Provider, MD  dicyclomine (BENTYL) 20 MG tablet Take 1 tablet (20 mg total) by mouth 4 (four) times daily -  before meals and at bedtime. 10/01/15  Yes Rogene Houston, MD  esomeprazole (NEXIUM) 40 MG capsule TAKE ONE CAPSULE BY MOUTH TWICE DAILY 11/10/15  Yes Butch Penny, NP  HYDROcodone-acetaminophen (NORCO/VICODIN) 5-325 MG per tablet Take 1 tablet by mouth every 6 (six) hours as needed for moderate pain or severe pain. postoperative 06/18/15  Yes [name], MD  hyoscyamine (LEVBID) 0.375 MG 12 hr tablet TAKE ONE TABLET BY MOUTH THREE TIMES DAILY BEFORE MEALS 07/16/15   Butch Penny, NP  ibuprofen (ADVIL,MOTRIN) 600 MG tablet Take 1 tablet (600 mg total) by mouth every 6 (six) hours as needed (mild pain). 06/11/15   [name], MD  LINZESS 290 MCG CAPS capsule Take 290 mcg by mouth daily. 05/23/15   Historical Provider, MD  Multiple Vitamin (DAILY VITAMIN PO) Take 1 tablet by mouth daily.     Historical Provider, MD  ondansetron (ZOFRAN) 4 MG tablet Take 1 tablet (4  mg total) by mouth every 8 (eight) hours as needed for nausea or vomiting. 05/24/14   Marchia Bond, MD  polyethylene glycol powder (GLYCOLAX/MIRALAX) powder TAKE 34 GRAMS (TWO CAPFULS) IN 8 OZ OF FLUID TWICE DAILY 11/12/15   Butch Penny, NP  SALINE NASAL SPRAY NA Place 1 spray into the nose 4 (four) times daily as needed. Allergies.    Historical Provider, MD   BP 133/89 mmHg  Pulse 95  Temp(Src) 98.2 F (36.8 C) (Oral)  Resp 20  Ht 5\' 3"  (1.6 m)  Wt 125 lb (56.7 kg)  BMI 22.15 kg/m2  SpO2 96%  LMP 05/27/2015 Physical Exam  Constitutional: She is oriented to person, place, and time. She appears well-developed and well-nourished. No distress.  tearful  HENT:  Head: Normocephalic and atraumatic.  Mouth/Throat:  Oropharynx is clear and moist. No oropharyngeal exudate.  Eyes: Conjunctivae and EOM are normal. Pupils are equal, round, and reactive to light.  Neck: Normal range of motion. Neck supple.  No meningismus.  Cardiovascular: Normal rate, regular rhythm, normal heart sounds and intact distal pulses.   No murmur heard. Pulmonary/Chest: Effort normal and breath sounds normal. No respiratory distress.  Abdominal: Soft. There is no tenderness. There is no rebound and no guarding.  Musculoskeletal: Normal range of motion. She exhibits no edema or tenderness.  Neurological: She is alert and oriented to person, place, and time. No cranial nerve deficit. She exhibits normal muscle tone. Coordination normal.  No ataxia on finger to nose bilaterally. No pronator drift. 5/5 strength throughout. CN 2-12 intact.Equal grip strength. Sensation intact.   Skin: Skin is warm.  Psychiatric: She has a normal mood and affect. Her behavior is normal.  Nursing note and vitals reviewed.   ED Course  Procedures (including critical care time) Labs Review Labs Reviewed  URINE RAPID DRUG SCREEN, HOSP PERFORMED - Abnormal; Notable for the following:    Opiates POSITIVE (*)    Benzodiazepines POSITIVE  (*)    All other components within normal limits  ETHANOL - Abnormal; Notable for the following:    Alcohol, Ethyl (B) 151 (*)    All other components within normal limits  CBC WITH DIFFERENTIAL/PLATELET - Abnormal; Notable for the following:    Hemoglobin 10.5 (*)    HCT 33.0 (*)    MCH 24.8 (*)    RDW 17.5 (*)    All other components within normal limits  HEPATIC FUNCTION PANEL - Abnormal; Notable for the following:    Total Protein 8.2 (*)    Bilirubin, Direct <0.1 (*)    All other components within normal limits  ACETAMINOPHEN LEVEL - Abnormal; Notable for the following:    Acetaminophen (Tylenol), Serum <10 (*)    All other components within normal limits  BASIC METABOLIC PANEL  SALICYLATE LEVEL    Imaging Review No results found. I have personally reviewed and evaluated these images and lab results as part of my medical decision-making.   EKG Interpretation None      MDM   Final diagnoses:  Suicidal ideation  Alcohol intoxication, uncomplicated ([place])   Suicidal gesture and threatening herself and others with gun. Vitals stable. No trauma  Labs obtained and are reassuring. Alcohol intoxication noted.  IVC paperwork completed and holding orders placed, including CIWA. Patient is medically clear for psychiatric evaluation.   Ezequiel Essex, MD 03/01/16 2250

## 2016-03-01 NOTE — ED Notes (Signed)
Pt's jewelery given to security to be logged.

## 2016-03-01 NOTE — ED Notes (Signed)
Pt continues to be tearful and states "it's so embarrassing".

## 2016-03-01 NOTE — ED Notes (Addendum)
Pt in with [facility] under emergency commitment due to her holding a gun to her head and pointing it at her husband. Pt states he husband is an "ass." pt will not answer any other questions. Pt now stating she wasn't trying to kill anyone that she was just trying to "scare the shit out of her husband because he was acting like an ass this weekend in front of her kids and grand kids."

## 2016-03-01 NOTE — BH Assessment (Signed)
Clinician has completed TTS interview. Per Patriciaann Clan, PA pt meets criteria for inpatient admission. Pt has been assigned to [facility] 400 bed 1 by [facility] and is able to be transported to facility immediately. Call report to [phone]. Pt RN (Ginger) has been informed of pt disposition.

## 2016-03-02 ENCOUNTER — Encounter (HOSPITAL_COMMUNITY): Payer: Self-pay | Admitting: *Deleted

## 2016-03-02 ENCOUNTER — Inpatient Hospital Stay (HOSPITAL_COMMUNITY)
Admission: EM | Admit: 2016-03-02 | Discharge: 2016-03-05 | DRG: 885 | Disposition: A | Discharge status: Home / Self Care | Payer: BLUE CROSS/BLUE SHIELD | Source: Intra-hospital | Attending: Psychiatry | Admitting: Psychiatry

## 2016-03-02 DIAGNOSIS — G4700 Insomnia, unspecified: Secondary | ICD-10-CM | POA: Diagnosis present

## 2016-03-02 DIAGNOSIS — F10129 Alcohol abuse with intoxication, unspecified: Secondary | ICD-10-CM | POA: Diagnosis present

## 2016-03-02 DIAGNOSIS — R45851 Suicidal ideations: Secondary | ICD-10-CM

## 2016-03-02 DIAGNOSIS — F332 Major depressive disorder, recurrent severe without psychotic features: Principal | ICD-10-CM | POA: Diagnosis present

## 2016-03-02 DIAGNOSIS — Y906 Blood alcohol level of 120-199 mg/100 ml: Secondary | ICD-10-CM | POA: Diagnosis present

## 2016-03-02 MED ORDER — ALUM & MAG HYDROXIDE-SIMETH 200-200-20 MG/5ML PO SUSP: 30.0000 mL | ORAL | Status: DC | PRN

## 2016-03-02 MED ORDER — LORAZEPAM 1 MG PO TABS: 1.0000 mg | ORAL_TABLET | Freq: Four times a day (QID) | ORAL | Status: AC | PRN

## 2016-03-02 MED ORDER — DICYCLOMINE HCL 20 MG PO TABS
20.0000 mg | ORAL_TABLET | Freq: Four times a day (QID) | ORAL | Status: DC | PRN
  Administered 2016-03-03 – 2016-03-05 (×3): 20 mg via ORAL
  Filled 2016-03-02 (×3): qty 1

## 2016-03-02 MED ORDER — HYOSCYAMINE SULFATE ER 0.375 MG PO TB12
0.3750 mg | ORAL_TABLET | Freq: Two times a day (BID) | ORAL | Status: DC | PRN
  Filled 2016-03-02: qty 1

## 2016-03-02 MED ORDER — LORAZEPAM 1 MG PO TABS
1.0000 mg | ORAL_TABLET | Freq: Three times a day (TID) | ORAL | Status: AC
  Administered 2016-03-03: 1 mg via ORAL
  Filled 2016-03-02 (×2): qty 1

## 2016-03-02 MED ORDER — METHOCARBAMOL 500 MG PO TABS
500.0000 mg | ORAL_TABLET | Freq: Three times a day (TID) | ORAL | Status: DC | PRN
  Administered 2016-03-03 – 2016-03-04 (×2): 500 mg via ORAL
  Filled 2016-03-02 (×2): qty 1

## 2016-03-02 MED ORDER — CITALOPRAM HYDROBROMIDE 40 MG PO TABS
40.0000 mg | ORAL_TABLET | Freq: Every day | ORAL | Status: DC
  Administered 2016-03-02 – 2016-03-04 (×3): 40 mg via ORAL
  Filled 2016-03-02: qty 1
  Filled 2016-03-02: qty 2
  Filled 2016-03-02 (×4): qty 1

## 2016-03-02 MED ORDER — DICYCLOMINE HCL 20 MG PO TABS
20.0000 mg | ORAL_TABLET | Freq: Three times a day (TID) | ORAL | Status: DC
  Administered 2016-03-02: 20 mg via ORAL
  Filled 2016-03-02 (×7): qty 1

## 2016-03-02 MED ORDER — LORAZEPAM 1 MG PO TABS
1.0000 mg | ORAL_TABLET | Freq: Four times a day (QID) | ORAL | Status: AC
  Administered 2016-03-02 (×3): 1 mg via ORAL
  Filled 2016-03-02 (×3): qty 1

## 2016-03-02 MED ORDER — TRAZODONE HCL 50 MG PO TABS
50.0000 mg | ORAL_TABLET | Freq: Every evening | ORAL | Status: DC | PRN
  Administered 2016-03-02 – 2016-03-04 (×3): 50 mg via ORAL
  Filled 2016-03-02 (×10): qty 1

## 2016-03-02 MED ORDER — LOPERAMIDE HCL 2 MG PO CAPS: 2.0000 mg | ORAL_CAPSULE | ORAL | Status: DC | PRN

## 2016-03-02 MED ORDER — ONDANSETRON 4 MG PO TBDP
4.0000 mg | ORAL_TABLET | Freq: Four times a day (QID) | ORAL | Status: DC | PRN
  Administered 2016-03-02 – 2016-03-05 (×4): 4 mg via ORAL
  Filled 2016-03-02 (×2): qty 1

## 2016-03-02 MED ORDER — ONDANSETRON HCL 4 MG PO TABS: 4.0000 mg | ORAL_TABLET | Freq: Three times a day (TID) | ORAL | Status: DC | PRN

## 2016-03-02 MED ORDER — LORATADINE 10 MG PO TABS
10.0000 mg | ORAL_TABLET | Freq: Every day | ORAL | Status: DC
  Administered 2016-03-02 – 2016-03-05 (×4): 10 mg via ORAL
  Filled 2016-03-02 (×7): qty 1

## 2016-03-02 MED ORDER — CLONIDINE HCL 0.1 MG PO TABS
0.1000 mg | ORAL_TABLET | Freq: Four times a day (QID) | ORAL | Status: AC
  Administered 2016-03-02 (×3): 0.1 mg via ORAL
  Filled 2016-03-02 (×9): qty 1

## 2016-03-02 MED ORDER — VITAMIN B-1 100 MG PO TABS
100.0000 mg | ORAL_TABLET | Freq: Every day | ORAL | Status: DC
  Administered 2016-03-03 – 2016-03-05 (×3): 100 mg via ORAL
  Filled 2016-03-02 (×5): qty 1

## 2016-03-02 MED ORDER — NAPROXEN 500 MG PO TABS
500.0000 mg | ORAL_TABLET | Freq: Two times a day (BID) | ORAL | Status: DC | PRN
  Administered 2016-03-03 – 2016-03-05 (×5): 500 mg via ORAL
  Filled 2016-03-02 (×5): qty 1

## 2016-03-02 MED ORDER — HYDROXYZINE HCL 25 MG PO TABS: 25.0000 mg | ORAL_TABLET | Freq: Four times a day (QID) | ORAL | Status: DC | PRN

## 2016-03-02 MED ORDER — MAGNESIUM HYDROXIDE 400 MG/5ML PO SUSP: 30.0000 mL | Freq: Every day | ORAL | Status: DC | PRN

## 2016-03-02 MED ORDER — ADULT MULTIVITAMIN W/MINERALS CH
1.0000 | ORAL_TABLET | Freq: Every day | ORAL | Status: DC
  Administered 2016-03-02 – 2016-03-05 (×4): 1 via ORAL
  Filled 2016-03-02 (×7): qty 1

## 2016-03-02 MED ORDER — HYDROXYZINE HCL 25 MG PO TABS
25.0000 mg | ORAL_TABLET | Freq: Four times a day (QID) | ORAL | Status: DC | PRN
  Administered 2016-03-02 – 2016-03-03 (×2): 25 mg via ORAL
  Filled 2016-03-02 (×2): qty 1

## 2016-03-02 MED ORDER — IBUPROFEN 600 MG PO TABS
600.0000 mg | ORAL_TABLET | Freq: Four times a day (QID) | ORAL | Status: DC | PRN
  Administered 2016-03-02: 600 mg via ORAL
  Filled 2016-03-02: qty 1

## 2016-03-02 MED ORDER — CLONIDINE HCL 0.1 MG PO TABS
0.1000 mg | ORAL_TABLET | Freq: Every day | ORAL | Status: DC
  Filled 2016-03-02 (×2): qty 1

## 2016-03-02 MED ORDER — LINACLOTIDE 290 MCG PO CAPS
290.0000 ug | ORAL_CAPSULE | Freq: Every day | ORAL | Status: DC
  Administered 2016-03-02 – 2016-03-05 (×4): 290 ug via ORAL
  Filled 2016-03-02 (×6): qty 1

## 2016-03-02 MED ORDER — THIAMINE HCL 100 MG/ML IJ SOLN
100.0000 mg | Freq: Once | INTRAMUSCULAR | Status: AC
  Administered 2016-03-02: 100 mg via INTRAMUSCULAR
  Filled 2016-03-02: qty 2

## 2016-03-02 MED ORDER — CLONIDINE HCL 0.1 MG PO TABS
0.1000 mg | ORAL_TABLET | ORAL | Status: DC
Start: 2016-03-04 — End: 2016-03-05
  Administered 2016-03-04 – 2016-03-05 (×3): 0.1 mg via ORAL
  Filled 2016-03-02 (×4): qty 1

## 2016-03-02 MED ORDER — CLONAZEPAM 1 MG PO TABS: 1.0000 mg | ORAL_TABLET | Freq: Three times a day (TID) | ORAL | Status: DC | PRN

## 2016-03-02 MED ORDER — PANTOPRAZOLE SODIUM 40 MG PO TBEC
40.0000 mg | DELAYED_RELEASE_TABLET | Freq: Every day | ORAL | Status: DC
  Administered 2016-03-02 – 2016-03-05 (×4): 40 mg via ORAL
  Filled 2016-03-02 (×7): qty 1

## 2016-03-02 MED ORDER — ONDANSETRON 4 MG PO TBDP
4.0000 mg | ORAL_TABLET | Freq: Four times a day (QID) | ORAL | Status: AC | PRN
  Administered 2016-03-03: 4 mg via ORAL
  Filled 2016-03-02 (×3): qty 1

## 2016-03-02 MED ORDER — LORAZEPAM 1 MG PO TABS
1.0000 mg | ORAL_TABLET | Freq: Two times a day (BID) | ORAL | Status: AC
  Administered 2016-03-04 (×2): 1 mg via ORAL
  Filled 2016-03-02 (×2): qty 1

## 2016-03-02 MED ORDER — LORAZEPAM 1 MG PO TABS
1.0000 mg | ORAL_TABLET | Freq: Every day | ORAL | Status: AC
  Administered 2016-03-05: 1 mg via ORAL
  Filled 2016-03-02: qty 1

## 2016-03-02 MED ORDER — ACETAMINOPHEN 325 MG PO TABS
650.0000 mg | ORAL_TABLET | Freq: Four times a day (QID) | ORAL | Status: DC | PRN
  Administered 2016-03-02 – 2016-03-03 (×2): 650 mg via ORAL
  Filled 2016-03-02 (×2): qty 2

## 2016-03-02 NOTE — Progress Notes (Signed)
Adult Psychoeducational Group Note  Date:  03/02/2016 Time:  8:00pm Group Topic/Focus:  Wrap-Up Group:   The focus of this group is to help patients review their daily goal of treatment and discuss progress on daily workbooks.  Participation Level:  Active  Participation Quality:  Appropriate and Attentive  Affect:  Appropriate  Cognitive:  Alert and Appropriate  Insight: Appropriate  Engagement in Group:  Engaged  Modes of Intervention:  Discussion  Additional Comments:  Pt was attentive and appropriate during tonights group discussion. Pt stated that the day improved through out the day. Defendant was able to share that she had med change and look forward to the change.   Theodoro Grist D 03/02/2016, 8:54 PM

## 2016-03-02 NOTE — BHH Group Notes (Signed)
[place] LCSW Group Therapy 03/02/2016  1:15 PM   Type of Therapy: Group Therapy  Participation Level: Did Not Attend. Patient invited to participate but declined.   Tilden Fossa, MSW, [place] Clinical Social Worker [facility] [phone]

## 2016-03-02 NOTE — Progress Notes (Signed)
Patient ID: Marie Hurley, female   DOB: 04/24/1966, 50 y.o.   MRN: [id number]  PER STATE REGULATIONS 482.30  THIS CHART WAS REVIEWED FOR MEDICAL NECESSITY WITH RESPECT TO THE PATIENT'S ADMISSION/DURATION OF STAY.  NEXT REVIEW DATE:03/06/2016  Roma Schanz, RN, BSN CASE MANAGER

## 2016-03-02 NOTE — Tx Team (Signed)
Initial Interdisciplinary Treatment Plan   PATIENT STRESSORS: Financial difficulties Marital or family conflict Substance abuse   PATIENT STRENGTHS: Ability for insight Average or above average intelligence Communication skills General fund of knowledge Supportive family/friends   PROBLEM LIST: Problem List/Patient Goals Date to be addressed Date deferred Reason deferred Estimated date of resolution  depression 03/02/2016     anxiety 03/02/2016     unreresolved grieve 03/02/2016     SI/HI 03/02/2016     "I want to get well and back on my medication" 03/02/2016                              DISCHARGE CRITERIA:  Ability to meet basic life and health needs Improved stabilization in mood, thinking, and/or behavior Medical problems require only outpatient monitoring Reduction of life-threatening or endangering symptoms to within safe limits Verbal commitment to aftercare and medication compliance  PRELIMINARY DISCHARGE PLAN: Attend aftercare/continuing care group Participate in family therapy Return to previous living arrangement  PATIENT/FAMIILY INVOLVEMENT: This treatment plan has been presented to and reviewed with the patient, Marie Hurley, The patient and family have been given the opportunity to ask questions and make suggestions.  JEHU-APPIAH,  K 03/02/2016, 5:17 AM

## 2016-03-02 NOTE — Progress Notes (Signed)
D: Pt A & O to self and place. Cooperative with care. Visible in hall at intervals during shift. Pt has been tearful, endorsed being anxious. Pt is guarded. Minimal interactions observed with staff and peers. Family session meeting was held earlier this shift.  A: Emotional support, encouragement and availability provided to pt. Scheduled and PRN medications administered as per MD's orders. Q 15 minutes checks maintained for safety on and off unit without self injurious behavior or outburst to note at this time.  R: Pt denies SI, HI, AVH and pain at present. Compliant with medications when offered. Denies adverse drug reactions when assessed. POC continues.

## 2016-03-02 NOTE — Progress Notes (Signed)
Patient ID: Marie Hurley, female   DOB: Mar 07, 1966, 50 y.o.   MRN: [id number]  Adult Psychoeducational Group Note  Date:  03/02/2016 Time: 08:55am   Group Topic/Focus:  Overcoming Stress:   The focus of this group is to define stress and help patients assess their triggers.  Participation Level:  Did Not Attend  Participation Quality: n/a  Affect: n/a  Cognitive:  n/a  Insight: n/a  Engagement in Group:  n/a  Modes of Intervention:  Activity, Education and Support  Additional Comments:  Pt did not attend, pt in bed asleep.   Elenore Rota 03/02/2016, 5:42 PM
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
## 2016-03-02 NOTE — Progress Notes (Signed)
Recreation Therapy Notes   Animal-Assisted Activity (AAA) Program Checklist/Progress Notes Patient Eligibility Criteria Checklist & Daily Group note for Rec Tx Intervention  Date: 05.23.2017 Time: 2:45pm Location: [address]    AAA/T Program Assumption of Risk Form signed by Patient/ or Parent Legal Guardian Yes  Patient is free of allergies or sever asthma Yes  Patient reports no fear of animals Yes  Patient reports no history of cruelty to animals Yes  Patient understands his/her participation is voluntary Yes  Behavioral Response: Did not attend.   Laureen Ochs , LRT/CTRS        Lane Hacker 03/02/2016 3:16 PM
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
## 2016-03-03 DIAGNOSIS — F332 Major depressive disorder, recurrent severe without psychotic features: Secondary | ICD-10-CM | POA: Insufficient documentation

## 2016-03-03 MED ORDER — CLINDAMYCIN HCL 150 MG PO CAPS
150.0000 mg | ORAL_CAPSULE | Freq: Three times a day (TID) | ORAL | Status: DC
  Administered 2016-03-03 – 2016-03-05 (×8): 150 mg via ORAL
  Filled 2016-03-03 (×14): qty 1
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
## 2016-03-03 NOTE — Progress Notes (Signed)
Recreation Therapy Notes  Date: 05.24.2017 Time: 9:30am Location: [address] Group Room   Group Topic: Stress Management  Goal Area(s) Addresses:  Patient will actively participate in stress management techniques presented during session.   Behavioral Response: Did not attend.   Laureen Ochs , LRT/CTRS        ,  L 03/03/2016 4:07 PM

## 2016-03-03 NOTE — Progress Notes (Signed)
D: Pt presents with flat affect and depressed mood. Pt rates depression 5/10. Hopeless 5/10. Anxiety 7/10. Pt denies suicidal thoughts. Pt reports withdrawal symptoms cramps, nausea, agitation and irritability. Pt Clonidine and Ativan held due to pt being hypotensive this morning and noon time. Dr. Parke Poisson made aware. Pt given fluids and encouraged to increase fluid intake. Pt c/o ongoing migraines. Pt given tylenol for pain. Tylenol not effective for migraine.  A: Medications reviewed with pt. Medications administered as ordered per MD. Verbal support provided. Pt encouraged to attend groups as tolerated. 15 minute checks performed for safety.  R: Pt verbalized that she do not want to take anymore oxy and wants to come off of it". Pt receptive to tx.

## 2016-03-03 NOTE — Progress Notes (Signed)
[facility] MD Progress Note  03/03/2016 5:12 PM Marie Hurley  MRN:  [id number] Subjective:  Patient reports headache, vague malaise, aches, discomfort. No vomiting, no diarrhea. Denies medication side effects at this time. In spite of discomfort , states she remains motivated in proceeding with detox. Objective : I have discussed case with treatment team and have met with patient. At this time , as above, reports some symptoms of WDL. Vitals ( B/P) has tended to be low, so that Clonidine has been held. Patient states she ate most of her lunch, and has been drinking " a lot of fluids". At this time denies feeling dizzy or lightheaded . Some visibility on unit, but limited interactions with peers. Denies suicidal ideations at this time, denies any homicidal ideations, and specifically denies any homicidal ideations towards husband. At this time describes vague sense of depression.  Principal Problem: Depression, Suicidal Ideations  Diagnosis:   Patient Active Problem List   Diagnosis Date Noted  . MDD (major depressive disorder), recurrent episode, severe ([place]) [F33.2] 03/02/2016  . Status post vaginal hysterectomy, anterior and posterior repair [Z90.710] 07/19/2015  . Postop check [Z09] 06/18/2015  . Complete rotator cuff tear [M75.120] 05/24/2014  . Recurrent biliary colic [F74.94] 49/67/5916  . Upper abdominal pain [R10.10] 11/01/2011  . IBS (irritable bowel syndrome) [K58.9] 11/01/2011  . Migraine [346] 11/01/2011  . Bleeding hemorrhoids [K64.9] 11/01/2011   Total Time spent with patient: 20 minutes    Past Medical History:  Past Medical History  Diagnosis Date  . Bowel obstruction ([place])   . IBS (irritable bowel syndrome)   . GERD (gastroesophageal reflux disease)   . Migraine   . Basal cell carcinoma of chest wall 2015    rIGHT IN THE MIDDLE OF THE PATIENT'S CHEST  . Seasonal allergies   . Arthritis   . Depression   . Anxiety   . PONV (postoperative nausea and vomiting)    . Hx MRSA infection 2008    multiple skin lesions  . Complete rotator cuff tear 05/24/2014  . 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 Anemia     Past Surgical History  Procedure Laterality Date  . Tubal ligation    . Carpal tunnel release      rt  . Ercp  2005,2007,2013    several  . Dilation and curettage of uterus  '91 & '96  . Colonoscopy with propofol N/A 02/07/2014    Procedure: COLONOSCOPY WITH PROPOFOL;  Surgeon: Rogene Houston, MD;  Location: [facility];  Service: Endoscopy;  Laterality: N/A;  entered cecum @ 0756 ; total cecal withdrawal time- 9 min  . Diagnostic laparoscopy  2007    exploratory-nic bowel duct  . Cholecystectomy  2002    lap choli  . Colonoscopy    . Shoulder arthroscopy with subacromial decompression, rotator cuff repair and bicep tendon repair Right 05/24/2014    Procedure: RIGHT SHOULDER ARTHROSCOPY DEBRIDEMENT LIMITED, DECOMPRESSION SUBACROMIAL PARTIAL ACROMIOPLASTY WITH ROTATOR CUFF REPAIR;  Surgeon: Johnny Bridge, MD;  Location: [facility];  Service: Orthopedics;  Laterality: Right;  . Vaginal hysterectomy N/A 06/10/2015    Procedure: HYSTERECTOMY VAGINAL;  Surgeon: [name], MD;  Location: [facility];  Service: Gynecology;  Laterality: N/A;  . Cystocele repair N/A 06/10/2015    Procedure: ANTERIOR REPAIR (CYSTOCELE);  Surgeon: [name], MD;  Location: [facility];  Service: Gynecology;  Laterality: N/A;  . Rectocele repair N/A 06/10/2015  Procedure: POSTERIOR REPAIR (RECTOCELE);  Surgeon: [name], MD;  Location: [facility];  Service: Gynecology;  Laterality: N/A;  . Abdominal hysterectomy    . Hemorroidectomy     Family History:  Family History  Problem Relation Age of Onset  . Hypertension Mother   . Heart disease Mother   . COPD Mother   . Heart disease Father   . Diabetes Father   . Hypertension Father   . Heart disease Sister   . Healthy Son   . Healthy Daughter     Social History:  History   Alcohol Use  . 0.6 oz/week  . 1 Standard drinks or equivalent per week    Comment: rare     History  Drug Use No    Social History   Social History  . Marital Status: Married    Spouse Name: N/A  . Number of Children: N/A  . Years of Education: N/A   Social History Main Topics  . Smoking status: Never Smoker   . Smokeless tobacco: Never Used  . Alcohol Use: 0.6 oz/week    1 Standard drinks or equivalent per week     Comment: rare  . Drug Use: No  . Sexual Activity: Yes    Birth Control/ Protection: Surgical   Other Topics Concern  . None   Social History Narrative   Additional Social History:   Sleep: improved   Appetite:  Fair  Current Medications: Current Facility-Administered Medications  Medication Dose Route Frequency Provider Last Rate Last Dose  . acetaminophen (TYLENOL) tablet 650 mg  650 mg Oral Q6H PRN Laverle Hobby, PA-C   650 mg at 03/03/16 0841  . alum & mag hydroxide-simeth (MAALOX/MYLANTA) 200-200-20 MG/5ML suspension 30 mL  30 mL Oral Q4H PRN Laverle Hobby, PA-C      . citalopram (CELEXA) tablet 40 mg  40 mg Oral QHS Laverle Hobby, PA-C   40 mg at 03/02/16 2234  . clindamycin (CLEOCIN) capsule 150 mg  150 mg Oral TID Laverle Hobby, PA-C   150 mg at 03/03/16 1651  . cloNIDine (CATAPRES) tablet 0.1 mg  0.1 mg Oral QID Jenne Campus, MD   0.1 mg at 03/02/16 2235   Followed by  . [START ON 03/04/2016] cloNIDine (CATAPRES) tablet 0.1 mg  0.1 mg Oral BH-qamhs Jenne Campus, MD       Followed by  . [START ON 03/06/2016] cloNIDine (CATAPRES) tablet 0.1 mg  0.1 mg Oral QAC breakfast Myer Peer , MD      . dicyclomine (BENTYL) tablet 20 mg  20 mg Oral Q6H PRN Jenne Campus, MD   20 mg at 03/03/16 1302  . hydrOXYzine (ATARAX/VISTARIL) tablet 25 mg  25 mg Oral Q6H PRN Jenne Campus, MD   25 mg at 03/03/16 1302  . linaclotide (LINZESS) capsule 290 mcg  290 mcg Oral Daily Laverle Hobby, PA-C   290 mcg at 03/03/16 4098  . loperamide  (IMODIUM) capsule 2-4 mg  2-4 mg Oral PRN Jenne Campus, MD      . loratadine (CLARITIN) tablet 10 mg  10 mg Oral Daily Laverle Hobby, PA-C   10 mg at 03/03/16 0827  . LORazepam (ATIVAN) tablet 1 mg  1 mg Oral Q6H PRN Jenne Campus, MD      . LORazepam (ATIVAN) tablet 1 mg  1 mg Oral TID Jenne Campus, MD   1 mg at 03/03/16 1651   Followed by  . [  START ON 03/04/2016] LORazepam (ATIVAN) tablet 1 mg  1 mg Oral BID Jenne Campus, MD       Followed by  . [START ON 03/05/2016] LORazepam (ATIVAN) tablet 1 mg  1 mg Oral Daily  A , MD      . magnesium hydroxide (MILK OF MAGNESIA) suspension 30 mL  30 mL Oral Daily PRN Laverle Hobby, PA-C      . methocarbamol (ROBAXIN) tablet 500 mg  500 mg Oral Q8H PRN Jenne Campus, MD   500 mg at 03/03/16 1302  . multivitamin with minerals tablet 1 tablet  1 tablet Oral Daily Jenne Campus, MD   1 tablet at 03/03/16 0827  . naproxen (NAPROSYN) tablet 500 mg  500 mg Oral BID PRN Jenne Campus, MD   500 mg at 03/03/16 1303  . ondansetron (ZOFRAN-ODT) disintegrating tablet 4 mg  4 mg Oral Q6H PRN Jenne Campus, MD   4 mg at 03/03/16 1304  . ondansetron (ZOFRAN-ODT) disintegrating tablet 4 mg  4 mg Oral Q6H PRN Jenne Campus, MD   4 mg at 03/02/16 1217  . pantoprazole (PROTONIX) EC tablet 40 mg  40 mg Oral Daily Laverle Hobby, PA-C   40 mg at 03/03/16 2035  . thiamine (VITAMIN B-1) tablet 100 mg  100 mg Oral Daily Jenne Campus, MD   100 mg at 03/03/16 0827  . traZODone (DESYREL) tablet 50 mg  50 mg Oral QHS,MR X 1 Laverle Hobby, PA-C   50 mg at 03/02/16 2234    Lab Results:  Results for orders placed or performed during the hospital encounter of 03/01/16 (from the past 48 hour(s))  Ethanol     Status: Abnormal   Collection Time: 03/01/16  8:35 PM  Result Value Ref Range   Alcohol, Ethyl (B) 151 (H) <5 mg/dL    Comment:        LOWEST DETECTABLE LIMIT FOR SERUM ALCOHOL IS 5 mg/dL FOR MEDICAL PURPOSES ONLY   CBC with  Differential     Status: Abnormal   Collection Time: 03/01/16  8:35 PM  Result Value Ref Range   WBC 7.3 4.0 - 10.5 K/uL   RBC 4.23 3.87 - 5.11 MIL/uL   Hemoglobin 10.5 (L) 12.0 - 15.0 g/dL   HCT 33.0 (L) 36.0 - 46.0 %   MCV 78.0 78.0 - 100.0 fL   MCH 24.8 (L) 26.0 - 34.0 pg   MCHC 31.8 30.0 - 36.0 g/dL   RDW 17.5 (H) 11.5 - 15.5 %   Platelets 354 150 - 400 K/uL   Neutrophils Relative % 44 %   Neutro Abs 3.2 1.7 - 7.7 K/uL   Lymphocytes Relative 44 %   Lymphs Abs 3.2 0.7 - 4.0 K/uL   Monocytes Relative 4 %   Monocytes Absolute 0.3 0.1 - 1.0 K/uL   Eosinophils Relative 7 %   Eosinophils Absolute 0.5 0.0 - 0.7 K/uL   Basophils Relative 1 %   Basophils Absolute 0.1 0.0 - 0.1 K/uL  Basic metabolic panel     Status: None   Collection Time: 03/01/16  8:35 PM  Result Value Ref Range   Sodium 137 135 - 145 mmol/L   Potassium 3.6 3.5 - 5.1 mmol/L   Chloride 103 101 - 111 mmol/L   CO2 26 22 - 32 mmol/L   Glucose, Bld 85 65 - 99 mg/dL   BUN 6 6 - 20 mg/dL   Creatinine, Ser 0.78 0.44 -  1.00 mg/dL   Calcium 9.2 8.9 - 10.3 mg/dL   GFR calc non Af Amer >60 >60 mL/min   GFR calc Af Amer >60 >60 mL/min    Comment: (NOTE) The eGFR has been calculated using the CKD EPI equation. This calculation has not been validated in all clinical situations. eGFR's persistently <60 mL/min signify possible Chronic Kidney Disease.    Anion gap 8 5 - 15  Hepatic function panel     Status: Abnormal   Collection Time: 03/01/16  8:35 PM  Result Value Ref Range   Total Protein 8.2 (H) 6.5 - 8.1 g/dL   Albumin 4.2 3.5 - 5.0 g/dL   AST 24 15 - 41 U/L   ALT 14 14 - 54 U/L   Alkaline Phosphatase 49 38 - 126 U/L   Total Bilirubin 0.3 0.3 - 1.2 mg/dL   Bilirubin, Direct <0.1 (L) 0.1 - 0.5 mg/dL   Indirect Bilirubin NOT CALCULATED 0.3 - 0.9 mg/dL  Acetaminophen level     Status: Abnormal   Collection Time: 03/01/16  8:35 PM  Result Value Ref Range   Acetaminophen (Tylenol), Serum <10 (L) 10 - 30 ug/mL     Comment:        THERAPEUTIC CONCENTRATIONS VARY SIGNIFICANTLY. A RANGE OF 10-30 ug/mL MAY BE AN EFFECTIVE CONCENTRATION FOR MANY PATIENTS. HOWEVER, SOME ARE BEST TREATED AT CONCENTRATIONS OUTSIDE THIS RANGE. ACETAMINOPHEN CONCENTRATIONS >150 ug/mL AT 4 HOURS AFTER INGESTION AND >50 ug/mL AT 12 HOURS AFTER INGESTION ARE OFTEN ASSOCIATED WITH TOXIC REACTIONS.   Salicylate level     Status: None   Collection Time: 03/01/16  8:35 PM  Result Value Ref Range   Salicylate Lvl <3.3 2.8 - 30.0 mg/dL  Urine rapid drug screen (hosp performed)     Status: Abnormal   Collection Time: 03/01/16  9:00 PM  Result Value Ref Range   Opiates POSITIVE (A) NONE DETECTED   Cocaine NONE DETECTED NONE DETECTED   Benzodiazepines POSITIVE (A) NONE DETECTED   Amphetamines NONE DETECTED NONE DETECTED   Tetrahydrocannabinol NONE DETECTED NONE DETECTED   Barbiturates NONE DETECTED NONE DETECTED    Comment:        DRUG SCREEN FOR MEDICAL PURPOSES ONLY.  IF CONFIRMATION IS NEEDED FOR ANY PURPOSE, NOTIFY LAB WITHIN 5 DAYS.        LOWEST DETECTABLE LIMITS FOR URINE DRUG SCREEN Drug Class       Cutoff (ng/mL) Amphetamine      1000 Barbiturate      200 Benzodiazepine   007 Tricyclics       622 Opiates          300 Cocaine          300 THC              50     Blood Alcohol level:  Lab Results  Component Value Date   [facility] 151* 03/01/2016    Physical Findings: AIMS: Facial and Oral Movements Muscles of Facial Expression: None, normal Lips and Perioral Area: None, normal Jaw: None, normal Tongue: None, normal,Extremity Movements Upper (arms, wrists, hands, fingers): None, normal Lower (legs, knees, ankles, toes): None, normal, Trunk Movements Neck, shoulders, hips: None, normal, Overall Severity Severity of abnormal movements (highest score from questions above): None, normal Incapacitation due to abnormal movements: None, normal Patient's awareness of abnormal movements (rate only patient's  report): No Awareness, Dental Status Current problems with teeth and/or dentures?: No Does patient usually wear dentures?: No  CIWA:  CIWA-Ar Total:  6 COWS:  COWS Total Score: 3  Musculoskeletal: Strength & Muscle Tone: within normal limits- no tremors, no diaphoresis, no psychomotor restlessness or agitation  Gait & Station: normal Patient leans: N/A  Psychiatric Specialty Exam: Physical Exam  ROS describes headache, denies chest pain, no shortness of breath, no vomiting , no diarrhea endorsed   Blood pressure 93/55, pulse 57, temperature 98.3 F (36.8 C), temperature source Oral, resp. rate 16, height _0  (1.6 m), weight 123 lb (55.792 kg), last menstrual period 05/27/2015.Body mass index is 21.79 kg/(m^2).  General Appearance: Fairly Groomed  Eye Contact:  Good  Speech:  Normal Rate  Volume:  Decreased  Mood:  depressed, but states feeling better   Affect:  constricted, does smile at times appropriately   Thought Process:  Linear  Orientation:  Other:  fully alert and attentive   Thought Content:  denies hallucinations, no delusions, not internally preoccupied   Suicidal Thoughts:  No denies any suicidal or self injurious ideations, denies any homicidal or violent ideations   Homicidal Thoughts:  No  Memory:  recent and remote grossly intact   Judgement:  improving   Insight:  Improving   Psychomotor Activity:  no current tremors or diaphoresis, no restlessness or agitation  Concentration:  Concentration: Good and Attention Span: Good  Recall:  Good  Fund of Knowledge:  Good  Language:  Good  Akathisia:  Negative  Handed:  Right  AIMS (if indicated):     Assets:  Desire for Improvement Resilience  ADL's:  Intact  Cognition:  WNL  Sleep:  Number of Hours: 6.5   Assessment - patient partially improved - remains vaguely depressed, constricted, but is future oriented, motivated in completing detoxification, and hoping to return home to loved ones soon. She denies any SI  or HI. BP has tended to be low, in spite of pushing PO fluids. She denies associated symptoms, but clonidine has been held due to BP.   Treatment Plan Summary: Daily contact with patient to assess and evaluate symptoms and progress in treatment, Medication management, Plan inpatient treatment  and medications as below Encourage group and milieu participation to work on coping skills and symptom reduction  Continue Celexa 40 mgrs QDAY for depression  Continue Clonidine detox protocol if BP parameters permit Continue to encourage PO fluids  Continue Ativan detox protocol  Treatment team working on disposition planning  Neita Garnet, MD 03/03/2016, 5:12 PM

## 2016-03-03 NOTE — [facility] Group Notes (Signed)
[facility] LCSW Aftercare Discharge Planning Group Note  03/03/2016  8:45 AM  Participation Quality: Did Not Attend. Patient invited to participate but declined.  Tilden Fossa, MSW, [place] Clinical Social Worker [facility] [phone]

## 2016-03-03 NOTE — Progress Notes (Signed)
  D: Pt was laying in bed prior to the assessment. Informed the writer that she'd had oral surgery a week prior to adm, and that there is medication she should be taking. Pt was positive for groups. Pt has no other questions or concerns.   A: Medication was not brought into the hosp as daughter had informed pt. However, Probation officer contacted extender and received order for clindamycin.Support and encouragement was offered. 15 min checks continued for safety.  R: Pt remains safe.

## 2016-03-04 NOTE — Progress Notes (Signed)
Adult Psychoeducational Group Note  Date:  03/04/2016 Time:  6:36 PM  Group Topic/Focus:  Identifying Needs:   The focus of this group is to help patients identify their personal needs that have been historically problematic and identify healthy behaviors to address their needs.  Participation Level:  Active  Participation Quality:  Appropriate  Affect:  Appropriate  Cognitive:  Appropriate  Insight: Appropriate  Engagement in Group:  Engaged  Modes of Intervention:  Discussion  Additional Comments:  Pt stated she is doing good.  Pt stated a lifestyle change for her would be "no drinking".  Elise Benne 03/04/2016, 6:36 PM

## 2016-03-04 NOTE — Progress Notes (Signed)
Patient ID: Marie Hurley, female   DOB: [birth year], 50 y.o.   MRN: [id number] [facility] MD Progress Note  03/04/2016 10:53 AM Dorthula Nettles  MRN:  [id number] Subjective:  Today reports feeling better, headache has subsided, and at this time does not endorse significant withdrawal symptoms. Tolerating medications well .  Objective : I have discussed case with treatment team and have met with patient. Patient presenting with improved mood and improved range of affect. Denies any suicidal ideations at this time At this time denies symptoms of WDL- no tremors, no diaphoresis, no visual disturbances, headache improved, and no significant aching or cramping. No vomiting, (+) loose stools. Vitals stable, denies dizziness- BP has tended to be low 93/55 today. Reports improving sleep and improving appetite.  States she has been visited by family and these have gone well , feels supported by family. Continues to express insight into the negative impact that alcohol and particularly in combination with prescribed BZDs/Opiate played in recent episode, and states , " I know I have a problem, I want to come off these medications and I do not intend to drink again".    Principal Problem: Depression, Suicidal Ideations  Diagnosis:   Patient Active Problem List   Diagnosis Date Noted  . Severe episode of recurrent major depressive disorder, without psychotic features ([place]) [F33.2]   . MDD (major depressive disorder), recurrent episode, severe ([place]) [F33.2] 03/02/2016  . Status post vaginal hysterectomy, anterior and posterior repair [Z90.710] 07/19/2015  . Postop check [Z09] 06/18/2015  . Complete rotator cuff tear [M75.120] 05/24/2014  . Recurrent biliary colic [X65.53] 74/82/7078  . Upper abdominal pain [R10.10] 11/01/2011  . IBS (irritable bowel syndrome) [K58.9] 11/01/2011  . Migraine [346] 11/01/2011  . Bleeding hemorrhoids [K64.9] 11/01/2011   Total Time spent with patient: 20 minutes    Past  Medical History:  Past Medical History  Diagnosis Date  . Bowel obstruction ([place])   . IBS (irritable bowel syndrome)   . GERD (gastroesophageal reflux disease)   . Migraine   . Basal cell carcinoma of chest wall 2015    rIGHT IN THE MIDDLE OF THE PATIENT'S CHEST  . Seasonal allergies   . Arthritis   . Depression   . Anxiety   . PONV (postoperative nausea and vomiting)   . Hx MRSA infection 2008    multiple skin lesions  . Complete rotator cuff tear 05/24/2014  . 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 Anemia     Past Surgical History  Procedure Laterality Date  . Tubal ligation    . Carpal tunnel release      rt  . Ercp  2005,2007,2013    several  . Dilation and curettage of uterus  '91 & '96  . Colonoscopy with propofol N/A 02/07/2014    Procedure: COLONOSCOPY WITH PROPOFOL;  Surgeon: Rogene Houston, MD;  Location: [facility];  Service: Endoscopy;  Laterality: N/A;  entered cecum @ 0756 ; total cecal withdrawal time- 9 min  . Diagnostic laparoscopy  2007    exploratory-nic bowel duct  . Cholecystectomy  2002    lap choli  . Colonoscopy    . Shoulder arthroscopy with subacromial decompression, rotator cuff repair and bicep tendon repair Right 05/24/2014    Procedure: RIGHT SHOULDER ARTHROSCOPY DEBRIDEMENT LIMITED, DECOMPRESSION SUBACROMIAL PARTIAL ACROMIOPLASTY WITH ROTATOR CUFF REPAIR;  Surgeon: Johnny Bridge, MD;  Location: [facility];  Service: Orthopedics;  Laterality: Right;  .  Vaginal hysterectomy N/A 06/10/2015    Procedure: HYSTERECTOMY VAGINAL;  Surgeon: [name], MD;  Location: [facility];  Service: Gynecology;  Laterality: N/A;  . Cystocele repair N/A 06/10/2015    Procedure: ANTERIOR REPAIR (CYSTOCELE);  Surgeon: [name], MD;  Location: [facility];  Service: Gynecology;  Laterality: N/A;  . Rectocele repair N/A 06/10/2015    Procedure: POSTERIOR REPAIR (RECTOCELE);  Surgeon: [name], MD;  Location: [facility];  Service:  Gynecology;  Laterality: N/A;  . Abdominal hysterectomy    . Hemorroidectomy     Family History:  Family History  Problem Relation Age of Onset  . Hypertension Mother   . Heart disease Mother   . COPD Mother   . Heart disease Father   . Diabetes Father   . Hypertension Father   . Heart disease Sister   . Healthy Son   . Healthy Daughter     Social History:  History  Alcohol Use  . 0.6 oz/week  . 1 Standard drinks or equivalent per week    Comment: rare     History  Drug Use No    Social History   Social History  . Marital Status: Married    Spouse Name: N/A  . Number of Children: N/A  . Years of Education: N/A   Social History Main Topics  . Smoking status: Never Smoker   . Smokeless tobacco: Never Used  . Alcohol Use: 0.6 oz/week    1 Standard drinks or equivalent per week     Comment: rare  . Drug Use: No  . Sexual Activity: Yes    Birth Control/ Protection: Surgical   Other Topics Concern  . None   Social History Narrative   Additional Social History:   Sleep: improved   Appetite:  Fair  Current Medications: Current Facility-Administered Medications  Medication Dose Route Frequency Provider Last Rate Last Dose  . acetaminophen (TYLENOL) tablet 650 mg  650 mg Oral Q6H PRN Laverle Hobby, PA-C   650 mg at 03/03/16 0841  . alum & mag hydroxide-simeth (MAALOX/MYLANTA) 200-200-20 MG/5ML suspension 30 mL  30 mL Oral Q4H PRN Laverle Hobby, PA-C      . citalopram (CELEXA) tablet 40 mg  40 mg Oral QHS Laverle Hobby, PA-C   40 mg at 03/03/16 2227  . clindamycin (CLEOCIN) capsule 150 mg  150 mg Oral TID Laverle Hobby, PA-C   150 mg at 03/04/16 6160  . cloNIDine (CATAPRES) tablet 0.1 mg  0.1 mg Oral BH-qamhs Myer Peer Cobos, MD   0.1 mg at 03/04/16 7371   Followed by  . [START ON 03/06/2016] cloNIDine (CATAPRES) tablet 0.1 mg  0.1 mg Oral QAC breakfast Myer Peer Cobos, MD      . dicyclomine (BENTYL) tablet 20 mg  20 mg Oral Q6H PRN Jenne Campus,  MD   20 mg at 03/03/16 1302  . hydrOXYzine (ATARAX/VISTARIL) tablet 25 mg  25 mg Oral Q6H PRN Jenne Campus, MD   25 mg at 03/03/16 1302  . linaclotide (LINZESS) capsule 290 mcg  290 mcg Oral Daily Laverle Hobby, PA-C   290 mcg at 03/04/16 0626  . loperamide (IMODIUM) capsule 2-4 mg  2-4 mg Oral PRN Jenne Campus, MD      . loratadine (CLARITIN) tablet 10 mg  10 mg Oral Daily Laverle Hobby, PA-C   10 mg at 03/04/16 9485  . LORazepam (ATIVAN) tablet 1 mg  1 mg Oral  Q6H PRN Jenne Campus, MD      . LORazepam (ATIVAN) tablet 1 mg  1 mg Oral BID Jenne Campus, MD   1 mg at 03/04/16 5681   Followed by  . [START ON 03/05/2016] LORazepam (ATIVAN) tablet 1 mg  1 mg Oral Daily Fernando A Cobos, MD      . magnesium hydroxide (MILK OF MAGNESIA) suspension 30 mL  30 mL Oral Daily PRN Laverle Hobby, PA-C      . methocarbamol (ROBAXIN) tablet 500 mg  500 mg Oral Q8H PRN Jenne Campus, MD   500 mg at 03/04/16 2751  . multivitamin with minerals tablet 1 tablet  1 tablet Oral Daily Jenne Campus, MD   1 tablet at 03/04/16 7001  . naproxen (NAPROSYN) tablet 500 mg  500 mg Oral BID PRN Jenne Campus, MD   500 mg at 03/04/16 7494  . ondansetron (ZOFRAN-ODT) disintegrating tablet 4 mg  4 mg Oral Q6H PRN Jenne Campus, MD   4 mg at 03/03/16 1304  . ondansetron (ZOFRAN-ODT) disintegrating tablet 4 mg  4 mg Oral Q6H PRN Jenne Campus, MD   4 mg at 03/03/16 2231  . pantoprazole (PROTONIX) EC tablet 40 mg  40 mg Oral Daily Laverle Hobby, PA-C   40 mg at 03/04/16 4967  . thiamine (VITAMIN B-1) tablet 100 mg  100 mg Oral Daily Jenne Campus, MD   100 mg at 03/04/16 5916  . traZODone (DESYREL) tablet 50 mg  50 mg Oral QHS,MR X 1 Laverle Hobby, PA-C   50 mg at 03/03/16 2227    Lab Results:  No results found for this or any previous visit (from the past 48 hour(s)).  Blood Alcohol level:  Lab Results  Component Value Date   [facility] 151* 03/01/2016    Physical Findings: AIMS: Facial  and Oral Movements Muscles of Facial Expression: None, normal Lips and Perioral Area: None, normal Jaw: None, normal Tongue: None, normal,Extremity Movements Upper (arms, wrists, hands, fingers): None, normal Lower (legs, knees, ankles, toes): None, normal, Trunk Movements Neck, shoulders, hips: None, normal, Overall Severity Severity of abnormal movements (highest score from questions above): None, normal Incapacitation due to abnormal movements: None, normal Patient's awareness of abnormal movements (rate only patient's report): No Awareness, Dental Status Current problems with teeth and/or dentures?: No Does patient usually wear dentures?: No  CIWA:  CIWA-Ar Total: 4 COWS:  COWS Total Score: 3  Musculoskeletal: Strength & Muscle Tone: within normal limits- no tremors, no diaphoresis, no psychomotor restlessness or agitation  Gait & Station: normal Patient leans: N/A  Psychiatric Specialty Exam: Physical Exam  ROS describes headache, denies chest pain, no shortness of breath, no vomiting , (+) loose stools, no melenas   Blood pressure 93/55, pulse 57, temperature 98.3 F (36.8 C), temperature source Oral, resp. rate 16, height 5' 3" (1.6 m), weight 123 lb (55.792 kg), last menstrual period 05/27/2015.Body mass index is 21.79 kg/(m^2).  General Appearance: Well Groomed  Eye Contact:  Good  Speech:  Normal Rate  Volume:  Normal  Mood:  improving, today denies depression  Affect:  Appropriate and fuller in range   Thought Process:  Linear  Orientation:  Other:  fully alert and attentive   Thought Content:  denies hallucinations, no delusions, not internally preoccupied   Suicidal Thoughts:  No denies any suicidal or self injurious ideations, denies any homicidal or violent ideations   Homicidal Thoughts:  No  Memory:  recent and remote grossly intact   Judgement:  improving   Insight:  Improving   Psychomotor Activity:  no current tremors or diaphoresis, no restlessness or  agitation  Concentration:  Concentration: Good and Attention Span: Good  Recall:  Good  Fund of Knowledge:  Good  Language:  Good  Akathisia:  Negative  Handed:  Right  AIMS (if indicated):     Assets:  Desire for Improvement Resilience  ADL's:  Intact  Cognition:  WNL  Sleep:  Number of Hours: 5   Assessment - patient improving, and presenting with much improved mood and affect at this time. Denies any SI or HI, and visits with husband and family members have gone well , feels supported by family. At this time completing detox protocols,  No active WDL symptoms noted or described . As she improves she is focusing more on discharge, hoping to return home soon.   Treatment Plan Summary: Daily contact with patient to assess and evaluate symptoms and progress in treatment, Medication management, Plan inpatient treatment  and medications as below Encourage group and milieu participation to work on coping skills and symptom reduction  Continue Celexa 40 mgrs QDAY for depression  Continue Clonidine detox protocol if BP parameters permit Continue to encourage PO fluids  Continue Ativan detox protocol  Treatment team working on disposition planning  Neita Garnet, MD 03/04/2016, 10:53 AM

## 2016-03-04 NOTE — Progress Notes (Signed)
D: Pt presents with flat affect and depressed mood. Pt rates depression 5/10. Anxiety 3/10. Pt denies suicidal thoughts. Pt reports withdrawal symptoms of chills, cramping, nausea, body aches and agitation. Pt given meds per protocol. Pt compliant with taking meds. No adverse reaction to meds verbalized by pt.  A: Medications reviewed with pt. Medications administered as ordered per MD. Verbal support given. Pt encouraged to attend groups. 15 minute checks performed for safety. R: Pt stated goal "stopping the pain, positive thoughts, and more sessions. Pt receptive to tx.

## 2016-03-04 NOTE — Progress Notes (Signed)
Psychoeducational Group Note  Date:  03/04/2016 Time:  0900  Group Topic/Focus:    Healthy Leisure Activities:   The group focuses on teaching patients how to develop and maintain. healthy leisure activities   Participation Level: Did Not Attend  Participation Quality:  Not Applicable  Affect:  Not Applicable  Cognitive:  Not Applicable  Insight:  Not Applicable  Engagement in Group: Not Applicable  Additional Comments:    Lauralyn Primes 03/04/2016, 10:20 AM

## 2016-03-04 NOTE — Progress Notes (Signed)
  D: Pt stated that her day was "rough because she got up with a migraine. Stated he also has states she has rheumatoid arthritis and bursitis. Stated the pain may be tue to the weather. Pt has no questions or concerns.    A:  Support and encouragement was offered. 15 min checks continued for safety.  R: Pt remains safe.

## 2016-03-04 NOTE — Progress Notes (Signed)
Pt attended karaoke group and participated. 

## 2016-03-04 NOTE — BHH Suicide Risk Assessment (Signed)
[place] INPATIENT:  Family/Significant Other Suicide Prevention Education  Suicide Prevention Education:  Contact Attempts: husband Marie Morein Sr. [phone], (name of family member/significant other) has been identified by the patient as the family member/significant other with whom the patient will be residing, and identified as the person(s) who will aid the patient in the event of a mental health crisis.  With written consent from the patient, two attempts were made to provide suicide prevention education, prior to and/or following the patient's discharge.  We were unsuccessful in providing suicide prevention education.  A suicide education pamphlet was given to the patient to share with family/significant other.  Date and time of first attempt: 03/04/16 at 1:15pm Date and time of second attempt: 03/04/16 at 3:20pm  , Marie Hurley 03/04/2016, 3:22 PM

## 2016-03-04 NOTE — Progress Notes (Signed)
D    Pt is pleasant on approach and cooperative    She interacts well with others and attends and participates in groups   She reports some mild anxiety and a headache    She denies withdrawal symptoms A   Verbal support given    Medications administered and effectiveness  Monitored     Q 15 min checks R   Pt is safe and receptive to verbal support

## 2016-03-04 NOTE — Progress Notes (Signed)
Nutrition Education Note  Pt attended group focusing on general, healthful nutrition education.  RD emphasized the importance of eating regular meals and snacks throughout the day. Consuming sugar-free beverages and incorporating fruits and vegetables into diet when possible. Provided examples of healthy snacks. Patient encouraged to leave group with a goal to improve nutrition/healthy eating.   Diet Order: Diet regular Room service appropriate?: Yes; Fluid consistency:: Thin Pt is also offered choice of unit snacks mid-morning and mid-afternoon.  Pt is eating as desired.   If additional nutrition issues arise, please consult RD.      , RD, LDN Inpatient Clinical Dietitian Pager # [phone] After hours/weekend pager # [phone]     

## 2016-03-04 NOTE — BHH Group Notes (Signed)
[facility] Mental Health Association Group Therapy 03/04/2016 1:15pm  Type of Therapy: Mental Health Association Presentation  Participation Level: Active  Participation Quality: Attentive  Affect: Appropriate  Cognitive: Oriented  Insight: Developing/Improving  Engagement in Therapy: Engaged  Modes of Intervention: Discussion, Education and Socialization  Summary of Progress/Problems: Mental Health Association ([place]) Speaker came to talk about his personal journey with substance abuse and addiction. The pt processed ways by which to relate to the speaker. [place] speaker provided handouts and educational information pertaining to groups and services offered by the [facility]. Pt was engaged in speaker's presentation and was receptive to resources provided.    Peri Maris, LCSWA 03/04/2016 1:49 PM

## 2016-03-05 MED ORDER — CLINDAMYCIN HCL 150 MG PO CAPS: ORAL_CAPSULE | ORAL | Status: DC

## 2016-03-05 MED ORDER — TRAZODONE HCL 50 MG PO TABS: 50.0000 mg | ORAL_TABLET | Freq: Every evening | ORAL | Status: DC | PRN

## 2016-03-05 MED ORDER — ESOMEPRAZOLE MAGNESIUM 40 MG PO CPDR: 40.0000 mg | DELAYED_RELEASE_CAPSULE | Freq: Two times a day (BID) | ORAL | Status: DC

## 2016-03-05 MED ORDER — DICYCLOMINE HCL 20 MG PO TABS: 20.0000 mg | ORAL_TABLET | Freq: Three times a day (TID) | ORAL | Status: DC

## 2016-03-05 MED ORDER — DAILY VITAMIN PO TABS: 1.0000 | ORAL_TABLET | Freq: Every day | ORAL | Status: AC

## 2016-03-05 MED ORDER — CITALOPRAM HYDROBROMIDE 40 MG PO TABS: 40.0000 mg | ORAL_TABLET | Freq: Every day | ORAL | Status: DC

## 2016-03-05 MED ORDER — CETIRIZINE HCL 10 MG PO TABS: 10.0000 mg | ORAL_TABLET | Freq: Every day | ORAL | Status: DC

## 2016-03-05 MED ORDER — LINZESS 290 MCG PO CAPS: 290.0000 ug | ORAL_CAPSULE | Freq: Every day | ORAL | Status: DC

## 2016-03-05 MED ORDER — ONDANSETRON HCL 4 MG PO TABS: 4.0000 mg | ORAL_TABLET | Freq: Three times a day (TID) | ORAL | Status: DC | PRN

## 2016-03-05 MED ORDER — HYDROXYZINE HCL 25 MG PO TABS: ORAL_TABLET | ORAL | Status: DC

## 2016-03-05 NOTE — BHH Group Notes (Signed)
[facility] LCSW Aftercare Discharge Planning Group Note  03/05/2016  8:45 AM  Participation Quality: Did Not Attend. Patient invited to participate but declined.  Tilden Fossa, MSW, [place] Clinical Social Worker [facility] [phone]

## 2016-03-05 NOTE — Progress Notes (Signed)
  [facility] Adult Case Management Discharge Plan :  Will you be returning to the same living situation after discharge:  Yes,  patient plans to return home to follow up with outpatient services At discharge, do you have transportation home?: Yes,  husband will transport\ Do you have the ability to pay for your medications: Yes,  patient will be provided with prescriptions at discharge   Release of information consent forms completed and in the chart;  Patient's signature needed at discharge.  Patient to Follow up at: Follow-up Information    Follow up with [facility].   Why:  Therapy appt with Peggy on Monday June 19th at 1pm. Medication management appt with Dr. Harrington Challenger on Tuesday July 18th at 9:45am. Please bring insurance card and call if you need to reschedule- there is a "no show" policy.    Contact information:   [id number] [place],  [address] [phone]       Follow up with [facility] On 03/30/2016.   Why:  Medication management appt with Dr. Woody Seller on Tuesday June 20th at 2pm. Please call office if you need to reschedule.    Contact information:   [address] [phone] fax [phone]      Next level of care provider has access to [place] and Suicide Prevention discussed: Yes,  with patient and husband  Have you used any form of tobacco in the last 30 days? (Cigarettes, Smokeless Tobacco, Cigars, and/or Pipes): No  Has patient been referred to the Quitline?: Patient refused referral  Patient has been referred for addiction treatment: Yes  , Casimiro Needle 03/05/2016, 12:01 PM

## 2016-03-05 NOTE — BHH Suicide Risk Assessment (Signed)
Marie Hurley INPATIENT:  Family/Significant Other Suicide Prevention Education  Suicide Prevention Education:  Education Completed; husband Emmalou Ketcham Sr. [phone],  (name of family member/significant other) has been identified by the patient as the family member/significant other with whom the patient will be residing, and identified as the person(s) who will aid the patient in the event of a mental health crisis (suicidal ideations/suicide attempt).  With written consent from the patient, the family member/significant other has been provided the following suicide prevention education, prior to the and/or following the discharge of the patient.  The suicide prevention education provided includes the following:  Suicide risk factors  Suicide prevention and interventions  National Suicide Hotline telephone number  [facility] assessment telephone number  [facility] Emergency Assistance [place] and/or Residential Mobile Crisis Unit telephone number  Request made of family/significant other to:  Remove weapons (e.g., guns, rifles, knives), all items previously/currently identified as safety concern.    Remove drugs/medications (over-the-counter, prescriptions, illicit drugs), all items previously/currently identified as a safety concern.  The family member/significant other verbalizes understanding of the suicide prevention education information provided.  The family member/significant other agrees to remove the items of safety concern listed above.  , Casimiro Needle 03/05/2016, 11:57 AM
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
## 2016-03-05 NOTE — Progress Notes (Signed)
Patient discharged per physician order; patient denies SI/HI and A/V hallucinations; patient received prescriptions, transition record, SRA, and AVS after it was reviewed with her with the patient. Patient signed and verbalized that she received all of her belongings; patient left the unit ambulatory

## 2016-03-29 ENCOUNTER — Ambulatory Visit (INDEPENDENT_AMBULATORY_CARE_PROVIDER_SITE_OTHER): Payer: BLUE CROSS/BLUE SHIELD | Admitting: Psychiatry

## 2016-03-29 ENCOUNTER — Encounter (HOSPITAL_COMMUNITY): Payer: Self-pay | Admitting: Psychiatry

## 2016-03-29 DIAGNOSIS — F332 Major depressive disorder, recurrent severe without psychotic features: Secondary | ICD-10-CM
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
## 2016-03-29 NOTE — Patient Instructions (Signed)
Discussed orally 

## 2016-04-03 ENCOUNTER — Other Ambulatory Visit (INDEPENDENT_AMBULATORY_CARE_PROVIDER_SITE_OTHER): Payer: Self-pay | Admitting: Internal Medicine

## 2016-04-06 ENCOUNTER — Telehealth (INDEPENDENT_AMBULATORY_CARE_PROVIDER_SITE_OTHER): Payer: Self-pay | Admitting: Internal Medicine

## 2016-04-06 NOTE — Telephone Encounter (Signed)
Patient called, wants a refill on her Dicyclomine called to [facility].  She stated that [facility] stated that they have sent several request by fax already.  [phone]

## 2016-04-07 NOTE — Telephone Encounter (Signed)
Rx for dicyclomine sent to her pharmacy 

## 2016-04-07 NOTE — Telephone Encounter (Signed)
I sent an Rx to her pharmacy for dicyclomine

## 2016-04-12 ENCOUNTER — Encounter (HOSPITAL_COMMUNITY): Payer: Self-pay | Admitting: Psychiatry

## 2016-04-12 ENCOUNTER — Ambulatory Visit (INDEPENDENT_AMBULATORY_CARE_PROVIDER_SITE_OTHER): Payer: BLUE CROSS/BLUE SHIELD | Admitting: Psychiatry

## 2016-04-12 DIAGNOSIS — F332 Major depressive disorder, recurrent severe without psychotic features: Secondary | ICD-10-CM | POA: Diagnosis not present

## 2016-04-12 NOTE — Patient Instructions (Signed)
Discussed orally 
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
## 2016-04-26 ENCOUNTER — Ambulatory Visit (INDEPENDENT_AMBULATORY_CARE_PROVIDER_SITE_OTHER): Payer: BLUE CROSS/BLUE SHIELD | Admitting: Psychiatry

## 2016-04-26 ENCOUNTER — Encounter (HOSPITAL_COMMUNITY): Payer: Self-pay | Admitting: Psychiatry

## 2016-04-26 DIAGNOSIS — F332 Major depressive disorder, recurrent severe without psychotic features: Secondary | ICD-10-CM
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
## 2016-04-26 NOTE — Patient Instructions (Signed)
Discussed orally 

## 2016-04-27 ENCOUNTER — Encounter (HOSPITAL_COMMUNITY): Payer: Self-pay | Admitting: Psychiatry

## 2016-04-27 ENCOUNTER — Ambulatory Visit (INDEPENDENT_AMBULATORY_CARE_PROVIDER_SITE_OTHER): Payer: BLUE CROSS/BLUE SHIELD | Admitting: Psychiatry

## 2016-04-27 VITALS — BP 126/79 | HR 80 | Ht 63.0 in | Wt 130.4 lb

## 2016-04-27 DIAGNOSIS — F332 Major depressive disorder, recurrent severe without psychotic features: Secondary | ICD-10-CM | POA: Diagnosis not present

## 2016-04-27 MED ORDER — DULOXETINE HCL 60 MG PO CPEP: 60.0000 mg | ORAL_CAPSULE | Freq: Every day | ORAL | Status: DC

## 2016-04-27 MED ORDER — HYDROXYZINE HCL 25 MG PO TABS: ORAL_TABLET | ORAL | Status: DC

## 2016-04-27 MED ORDER — TRAZODONE HCL 50 MG PO TABS: 50.0000 mg | ORAL_TABLET | Freq: Every evening | ORAL | Status: DC | PRN
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
## 2016-05-10 ENCOUNTER — Ambulatory Visit (HOSPITAL_COMMUNITY): Payer: Self-pay | Admitting: Psychiatry

## 2016-05-12 ENCOUNTER — Encounter (INDEPENDENT_AMBULATORY_CARE_PROVIDER_SITE_OTHER): Payer: Self-pay | Admitting: Internal Medicine

## 2016-05-24 ENCOUNTER — Ambulatory Visit (INDEPENDENT_AMBULATORY_CARE_PROVIDER_SITE_OTHER): Payer: BLUE CROSS/BLUE SHIELD | Admitting: Psychiatry

## 2016-05-24 ENCOUNTER — Encounter (HOSPITAL_COMMUNITY): Payer: Self-pay | Admitting: Psychiatry

## 2016-05-24 DIAGNOSIS — F332 Major depressive disorder, recurrent severe without psychotic features: Secondary | ICD-10-CM
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
## 2016-05-25 ENCOUNTER — Encounter (HOSPITAL_COMMUNITY): Payer: Self-pay | Admitting: Psychiatry

## 2016-05-25 ENCOUNTER — Ambulatory Visit (INDEPENDENT_AMBULATORY_CARE_PROVIDER_SITE_OTHER): Payer: BLUE CROSS/BLUE SHIELD | Admitting: Psychiatry

## 2016-05-25 VITALS — BP 131/76 | HR 92 | Ht 63.0 in | Wt 130.6 lb

## 2016-05-25 DIAGNOSIS — F332 Major depressive disorder, recurrent severe without psychotic features: Secondary | ICD-10-CM | POA: Diagnosis not present
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
## 2016-06-10 ENCOUNTER — Ambulatory Visit (HOSPITAL_COMMUNITY): Payer: Self-pay | Admitting: Psychiatry

## 2016-06-16 ENCOUNTER — Ambulatory Visit (INDEPENDENT_AMBULATORY_CARE_PROVIDER_SITE_OTHER): Payer: BLUE CROSS/BLUE SHIELD | Admitting: Obstetrics and Gynecology

## 2016-06-16 ENCOUNTER — Encounter: Payer: Self-pay | Admitting: Obstetrics and Gynecology

## 2016-06-16 VITALS — BP 106/78 | HR 96 | Ht 62.75 in | Wt 133.2 lb

## 2016-06-16 DIAGNOSIS — Z01419 Encounter for gynecological examination (general) (routine) without abnormal findings: Secondary | ICD-10-CM

## 2016-06-16 DIAGNOSIS — Z90710 Acquired absence of both cervix and uterus: Secondary | ICD-10-CM | POA: Diagnosis not present

## 2016-06-16 DIAGNOSIS — N951 Menopausal and female climacteric states: Secondary | ICD-10-CM | POA: Diagnosis not present

## 2016-06-16 DIAGNOSIS — F3289 Other specified depressive episodes: Secondary | ICD-10-CM | POA: Diagnosis not present

## 2016-06-16 MED ORDER — ESTRADIOL 1 MG PO TABS: 1.0000 mg | ORAL_TABLET | Freq: Every day | ORAL | 11 refills | Status: DC
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
## 2016-06-16 NOTE — Patient Instructions (Signed)
Lubricants:  ° °[place]  °Feminease  °Gynemoistrin  ° °

## 2016-06-17 ENCOUNTER — Telehealth: Payer: Self-pay | Admitting: Obstetrics and Gynecology

## 2016-06-17 NOTE — Telephone Encounter (Signed)
Pt requesting a Rx for Estradiol 1 mg #90 due to cost. Pt states she has already picked up the 30 day supply yesterday.   Estradiol 1 mg po daily, # 90, 3 refills called to [facility], [place], [place] per Dr.Ferguson.   Also pt requesting lab orders to be faxed to the [facility] pt will go there Monday to have the labs drawn.

## 2016-06-18 NOTE — Telephone Encounter (Signed)
Lab orders faxed to [facility] per pt request.

## 2016-06-23 ENCOUNTER — Ambulatory Visit (HOSPITAL_COMMUNITY): Payer: Self-pay | Admitting: Psychiatry

## 2016-06-24 ENCOUNTER — Ambulatory Visit (HOSPITAL_COMMUNITY): Payer: Self-pay | Admitting: Psychiatry

## 2016-07-21 ENCOUNTER — Other Ambulatory Visit: Payer: Self-pay | Admitting: Obstetrics and Gynecology

## 2016-07-21 DIAGNOSIS — Z1231 Encounter for screening mammogram for malignant neoplasm of breast: Secondary | ICD-10-CM

## 2016-07-22 ENCOUNTER — Other Ambulatory Visit (INDEPENDENT_AMBULATORY_CARE_PROVIDER_SITE_OTHER): Payer: Self-pay | Admitting: Internal Medicine

## 2016-07-27 ENCOUNTER — Other Ambulatory Visit (HOSPITAL_COMMUNITY): Payer: Self-pay | Admitting: Psychiatry

## 2016-07-28 ENCOUNTER — Ambulatory Visit (HOSPITAL_COMMUNITY): Payer: Self-pay

## 2016-07-29 ENCOUNTER — Ambulatory Visit (INDEPENDENT_AMBULATORY_CARE_PROVIDER_SITE_OTHER): Payer: BLUE CROSS/BLUE SHIELD | Admitting: Psychiatry

## 2016-07-29 ENCOUNTER — Encounter (HOSPITAL_COMMUNITY): Payer: Self-pay | Admitting: Psychiatry

## 2016-07-29 VITALS — BP 141/79 | HR 90 | Ht 62.75 in | Wt 136.6 lb

## 2016-07-29 DIAGNOSIS — Z818 Family history of other mental and behavioral disorders: Secondary | ICD-10-CM | POA: Diagnosis not present

## 2016-07-29 DIAGNOSIS — F332 Major depressive disorder, recurrent severe without psychotic features: Secondary | ICD-10-CM

## 2016-07-29 DIAGNOSIS — Z8249 Family history of ischemic heart disease and other diseases of the circulatory system: Secondary | ICD-10-CM

## 2016-07-29 DIAGNOSIS — Z833 Family history of diabetes mellitus: Secondary | ICD-10-CM | POA: Diagnosis not present

## 2016-07-29 MED ORDER — TRAZODONE HCL 50 MG PO TABS: 50.0000 mg | ORAL_TABLET | Freq: Every evening | ORAL | 2 refills | Status: DC | PRN

## 2016-07-29 MED ORDER — HYDROXYZINE HCL 25 MG PO TABS: ORAL_TABLET | ORAL | 2 refills | Status: DC

## 2016-07-29 MED ORDER — DULOXETINE HCL 60 MG PO CPEP
60.0000 mg | ORAL_CAPSULE | Freq: Every day | ORAL | 2 refills | Status: DC
Start: 2016-07-29 — End: 2016-10-28
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
## 2016-08-11 ENCOUNTER — Ambulatory Visit (HOSPITAL_COMMUNITY): Payer: Self-pay

## 2016-08-25 ENCOUNTER — Other Ambulatory Visit (INDEPENDENT_AMBULATORY_CARE_PROVIDER_SITE_OTHER): Payer: Self-pay | Admitting: Internal Medicine

## 2016-08-31 ENCOUNTER — Encounter (INDEPENDENT_AMBULATORY_CARE_PROVIDER_SITE_OTHER): Payer: Self-pay | Admitting: *Deleted

## 2016-08-31 ENCOUNTER — Encounter (INDEPENDENT_AMBULATORY_CARE_PROVIDER_SITE_OTHER): Payer: Self-pay | Admitting: Internal Medicine

## 2016-08-31 ENCOUNTER — Other Ambulatory Visit (INDEPENDENT_AMBULATORY_CARE_PROVIDER_SITE_OTHER): Payer: Self-pay | Admitting: *Deleted

## 2016-08-31 ENCOUNTER — Ambulatory Visit (INDEPENDENT_AMBULATORY_CARE_PROVIDER_SITE_OTHER): Payer: BLUE CROSS/BLUE SHIELD | Admitting: Internal Medicine

## 2016-08-31 VITALS — BP 112/86 | HR 68 | Temp 98.1°F | Ht 63.0 in | Wt 136.0 lb

## 2016-08-31 DIAGNOSIS — R1011 Right upper quadrant pain: Secondary | ICD-10-CM

## 2016-08-31 DIAGNOSIS — K219 Gastro-esophageal reflux disease without esophagitis: Secondary | ICD-10-CM

## 2016-08-31 DIAGNOSIS — K5909 Other constipation: Secondary | ICD-10-CM | POA: Diagnosis not present

## 2016-08-31 NOTE — Patient Instructions (Signed)
Keep dicyclomine use to no more than 2-3 doses per day if possible. Increase intake of fiber rich foods such as apples and prunes.

## 2016-08-31 NOTE — Progress Notes (Signed)
Presenting complaint;  Follow-up for abdominal pain constipation and GERD.  Subjective:  Patient is 50 year old Caucasian female who is here for scheduled visit. She was last seen in October 2016. She was noted to have fourth degree hemorrhoids. She was referred to Dr. Cheryll Cockayne of [facility] and underwent hemorrhoidectomy in December 2016. She states it took her a few weeks to fully recover. She is having 1-3 bowel movements per day. Every now and then she has sense of complete evacuation. She denies melena or rectal bleeding. She has heartburn no more than once a week. She tried to decrease Nexium dose to one daily with began to have severe heartburn. She takes ibuprofen every now and then for migraine. She takes 600 mg at a time. She continues to complain of right upper quadrant abdominal pain. She's states is not as often and has intense as it used to be. It may last from a few minutes to several hours. She feels dicyclomine is helping. She continues to complain of daily nausea. However she does not vomit. She takes ondansetron or Dramamine for relief of her nausea. She has good appetite and she has gained 16 pounds since her last visit. Patient states she had a bout of severe depression about 6 months ago. She states she wanted to tell her husband or herself. She is under care of Dr. Levonne Spiller and doing well. She is not taking narcotics anymore.    Current Medications: Outpatient Encounter Prescriptions as of 08/31/2016  Medication Sig  . cetirizine (ZYRTEC) 10 MG tablet Take 1 tablet (10 mg total) by mouth daily. For allergies  . dicyclomine (BENTYL) 20 MG tablet TAKE ONE TABLET BY MOUTH FOUR TIMES DAILY BEFORE MEALS AND AT BEDTIME  . DULoxetine (CYMBALTA) 60 MG capsule Take 1 capsule (60 mg total) by mouth daily.  Marland Kitchen esomeprazole (NEXIUM) 40 MG capsule Take 1 capsule (40 mg total) by mouth 2 (two) times daily. For acid reflux  . esomeprazole (NEXIUM) 40 MG capsule TAKE ONE CAPSULE BY  MOUTH TWICE DAILY  . estradiol (ESTRACE) 1 MG tablet Take 1 tablet (1 mg total) by mouth daily.  . hydrOXYzine (ATARAX/VISTARIL) 25 MG tablet Take 1 tablet (25 mg) four times daily as needed: For anxiety  . ibuprofen (ADVIL,MOTRIN) 200 MG tablet Take by mouth.  Marland Kitchen LINZESS 290 MCG CAPS capsule Take 1 capsule (290 mcg total) by mouth daily. For constipation  . meclizine (ANTIVERT) 25 MG tablet Take 25 mg by mouth 3 (three) times daily as needed for dizziness.  . Multiple Vitamin (DAILY VITAMIN) tablet Take 1 tablet by mouth daily. For low Vitamin  . naproxen sodium (ANAPROX) 220 MG tablet Take 220 mg by mouth 2 (two) times daily with a meal.  . ondansetron (ZOFRAN) 4 MG tablet Take 1 tablet (4 mg total) by mouth every 8 (eight) hours as needed for nausea or vomiting.  . polyethylene glycol powder (GLYCOLAX/MIRALAX) powder TAKE 34 GRAMS (TWO CAPFULS) IN 8 OZ OF FLUID TWICE DAILY  . Pramoxine-HC (HYDROCORTISONE ACE-PRAMOXINE) 2.5-1 % CREA Apply topically.  . traZODone (DESYREL) 50 MG tablet Take 1 tablet (50 mg total) by mouth at bedtime and may repeat dose one time if needed. For sleep   No facility-administered encounter medications on file as of 08/31/2016.      Objective: Blood pressure 112/86, pulse 68, temperature 98.1 F (36.7 C), height 5\' 3"  (1.6 m), weight 136 lb (61.7 kg), last menstrual period 05/27/2015. Patient is alert and in no acute distress. Conjunctiva is  pink. Sclera is nonicteric Oropharyngeal mucosa is normal. No neck masses or thyromegaly noted. Cardiac exam with regular rhythm normal S1 and S2. No murmur or gallop noted. Lungs are clear to auscultation. Abdomen is symmetrical. Bowel sounds are normal. On palpation abdomen is soft with mild tenderness noted at RUQ and LLQ. No organomegaly masses or guarding noted.  No LE edema or clubbing noted.   Assessment:  #1. Chronic constipation. She is requiring two medications for results. She needs to make sure she has at  least three bowel movements a week. She is at risk for recurrent hemorrhoids. #2. GERD. She is requiring double dose PPI for symptom control. Single-dose therapy did not work.  #3. Right upper quadrant abdominal pain. She has history of SOD dysfunction and has undergone 2 therapeutic ERCPs in the past. LFTs repeatedly have been normal. It is possible that this pain is due to IBS with atypical location. Dicyclomine seemed to provide some relief.   Plan:  Patient advised keep dicyclomine dose to 3 times a day as higher dose would make constipation worse. Patient advised to increase intake of fiber a food such as apples and prunes. She will continue as omeprazole at 40 mg by mouth twice a day. Office visit in one year or earlier if needed.

## 2016-09-22 ENCOUNTER — Other Ambulatory Visit (HOSPITAL_COMMUNITY): Payer: Self-pay | Admitting: Psychiatry

## 2016-09-22 ENCOUNTER — Other Ambulatory Visit (INDEPENDENT_AMBULATORY_CARE_PROVIDER_SITE_OTHER): Payer: Self-pay | Admitting: Internal Medicine

## 2016-10-28 ENCOUNTER — Ambulatory Visit (HOSPITAL_COMMUNITY): Payer: Self-pay | Admitting: Psychiatry

## 2016-10-28 ENCOUNTER — Other Ambulatory Visit (HOSPITAL_COMMUNITY): Payer: Self-pay | Admitting: Psychiatry

## 2016-10-29 ENCOUNTER — Telehealth (HOSPITAL_COMMUNITY): Payer: Self-pay | Admitting: *Deleted

## 2016-10-29 NOTE — Telephone Encounter (Signed)
You may call in 30 days, make appt

## 2016-10-29 NOTE — Telephone Encounter (Signed)
patient is out of Cymbalta.  She was scheduled yesterday, rescheduled 11/08/16.

## 2016-11-01 NOTE — Telephone Encounter (Signed)
Provider sent pt Cymbalta to her pharmacy on 10-29-2016

## 2016-11-08 ENCOUNTER — Ambulatory Visit (INDEPENDENT_AMBULATORY_CARE_PROVIDER_SITE_OTHER): Payer: BLUE CROSS/BLUE SHIELD | Admitting: Psychiatry

## 2016-11-08 ENCOUNTER — Encounter (HOSPITAL_COMMUNITY): Payer: Self-pay | Admitting: Psychiatry

## 2016-11-08 VITALS — BP 143/78 | HR 82 | Ht 63.0 in | Wt 141.2 lb

## 2016-11-08 DIAGNOSIS — Z833 Family history of diabetes mellitus: Secondary | ICD-10-CM

## 2016-11-08 DIAGNOSIS — Z9889 Other specified postprocedural states: Secondary | ICD-10-CM

## 2016-11-08 DIAGNOSIS — Z9049 Acquired absence of other specified parts of digestive tract: Secondary | ICD-10-CM

## 2016-11-08 DIAGNOSIS — Z79899 Other long term (current) drug therapy: Secondary | ICD-10-CM

## 2016-11-08 DIAGNOSIS — Z90710 Acquired absence of both cervix and uterus: Secondary | ICD-10-CM

## 2016-11-08 DIAGNOSIS — F332 Major depressive disorder, recurrent severe without psychotic features: Secondary | ICD-10-CM | POA: Diagnosis not present

## 2016-11-08 DIAGNOSIS — Z818 Family history of other mental and behavioral disorders: Secondary | ICD-10-CM

## 2016-11-08 DIAGNOSIS — Z98891 History of uterine scar from previous surgery: Secondary | ICD-10-CM

## 2016-11-08 DIAGNOSIS — Z888 Allergy status to other drugs, medicaments and biological substances status: Secondary | ICD-10-CM

## 2016-11-08 DIAGNOSIS — Z880 Allergy status to penicillin: Secondary | ICD-10-CM

## 2016-11-08 DIAGNOSIS — Z8249 Family history of ischemic heart disease and other diseases of the circulatory system: Secondary | ICD-10-CM

## 2016-11-08 MED ORDER — HYDROXYZINE HCL 25 MG PO TABS: ORAL_TABLET | ORAL | 3 refills | Status: DC

## 2016-11-08 MED ORDER — TRAZODONE HCL 50 MG PO TABS: 50.0000 mg | ORAL_TABLET | Freq: Every evening | ORAL | 3 refills | Status: DC | PRN

## 2016-11-08 MED ORDER — DULOXETINE HCL 60 MG PO CPEP: 60.0000 mg | ORAL_CAPSULE | Freq: Every day | ORAL | 3 refills | Status: DC
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
## 2016-12-23 ENCOUNTER — Telehealth (HOSPITAL_COMMUNITY): Payer: Self-pay | Admitting: *Deleted

## 2016-12-23 NOTE — Telephone Encounter (Signed)
returned phone call, left voice message. 

## 2017-01-24 DIAGNOSIS — Z029 Encounter for administrative examinations, unspecified: Secondary | ICD-10-CM

## 2017-02-24 ENCOUNTER — Ambulatory Visit (INDEPENDENT_AMBULATORY_CARE_PROVIDER_SITE_OTHER): Payer: BLUE CROSS/BLUE SHIELD | Admitting: Psychiatry

## 2017-02-24 ENCOUNTER — Encounter (HOSPITAL_COMMUNITY): Payer: Self-pay | Admitting: Psychiatry

## 2017-02-24 VITALS — BP 130/76 | HR 84 | Ht 63.0 in | Wt 150.0 lb

## 2017-02-24 DIAGNOSIS — Z818 Family history of other mental and behavioral disorders: Secondary | ICD-10-CM | POA: Diagnosis not present

## 2017-02-24 DIAGNOSIS — F332 Major depressive disorder, recurrent severe without psychotic features: Secondary | ICD-10-CM | POA: Diagnosis not present

## 2017-02-24 DIAGNOSIS — Z79899 Other long term (current) drug therapy: Secondary | ICD-10-CM | POA: Diagnosis not present

## 2017-02-24 MED ORDER — BUPROPION HCL ER (XL) 150 MG PO TB24: 150.0000 mg | ORAL_TABLET | ORAL | 2 refills | Status: DC

## 2017-02-24 MED ORDER — HYDROXYZINE HCL 25 MG PO TABS: ORAL_TABLET | ORAL | 3 refills | Status: DC

## 2017-02-24 MED ORDER — DULOXETINE HCL 60 MG PO CPEP: 60.0000 mg | ORAL_CAPSULE | Freq: Every day | ORAL | 3 refills | Status: DC
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
## 2017-02-28 ENCOUNTER — Ambulatory Visit (HOSPITAL_COMMUNITY): Payer: Self-pay | Admitting: Psychiatry

## 2017-03-24 ENCOUNTER — Ambulatory Visit (HOSPITAL_COMMUNITY): Payer: Self-pay | Admitting: Psychiatry

## 2017-03-30 ENCOUNTER — Ambulatory Visit (INDEPENDENT_AMBULATORY_CARE_PROVIDER_SITE_OTHER): Payer: BLUE CROSS/BLUE SHIELD | Admitting: Psychiatry

## 2017-03-30 ENCOUNTER — Encounter (HOSPITAL_COMMUNITY): Payer: Self-pay | Admitting: Psychiatry

## 2017-03-30 VITALS — BP 130/90 | HR 79 | Ht 63.0 in | Wt 154.0 lb

## 2017-03-30 DIAGNOSIS — F332 Major depressive disorder, recurrent severe without psychotic features: Secondary | ICD-10-CM

## 2017-03-30 DIAGNOSIS — Z818 Family history of other mental and behavioral disorders: Secondary | ICD-10-CM

## 2017-03-30 MED ORDER — HYDROXYZINE HCL 25 MG PO TABS: ORAL_TABLET | ORAL | 3 refills | Status: DC

## 2017-03-30 MED ORDER — TRAZODONE HCL 50 MG PO TABS: 50.0000 mg | ORAL_TABLET | Freq: Every evening | ORAL | 3 refills | Status: DC | PRN

## 2017-03-30 MED ORDER — DULOXETINE HCL 60 MG PO CPEP: 60.0000 mg | ORAL_CAPSULE | Freq: Every day | ORAL | 3 refills | Status: DC
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
## 2017-04-14 ENCOUNTER — Other Ambulatory Visit: Payer: Self-pay | Admitting: Obstetrics and Gynecology

## 2017-04-22 ENCOUNTER — Other Ambulatory Visit (INDEPENDENT_AMBULATORY_CARE_PROVIDER_SITE_OTHER): Payer: Self-pay | Admitting: Internal Medicine

## 2017-06-15 ENCOUNTER — Encounter (HOSPITAL_COMMUNITY): Payer: Self-pay | Admitting: Psychiatry

## 2017-06-15 ENCOUNTER — Telehealth (HOSPITAL_COMMUNITY): Payer: Self-pay | Admitting: *Deleted

## 2017-06-15 NOTE — Telephone Encounter (Signed)
Letter printed.

## 2017-06-15 NOTE — Telephone Encounter (Signed)
Per pt call, she needs a letter from Dr. Harrington Challenger stating she is taking anti depression and have not had anymore episodes since last year. Per pt the reason why she needs this letter is due to son is in a custody battle with his child father. Per pt, her grandchild mother is telling the court that pt is not fit to be around her grandchild. Per pt, her son and grandchildren moved back in with her and don't have them all the time. Pt number is [phone].

## 2017-06-15 NOTE — Telephone Encounter (Signed)
Called pt and informed her that the letter she requested is ready for pick up. Staff was able to speak with pt. Staff asked pt if she wanted to come in today to pick up letter. Per pt she will come in on Friday 06-17-2017 to get letter before office close.

## 2017-06-17 ENCOUNTER — Telehealth (HOSPITAL_COMMUNITY): Payer: Self-pay | Admitting: *Deleted

## 2017-06-17 NOTE — Telephone Encounter (Signed)
Pt came into office to pick up the letter she request per previous message. Letter was given to pt. Pt D/L number [phone] exp 12-15-24. A copy of the letter was made and sent to scan center. Pt also singed release for the location that will be receiving this letter.

## 2017-06-20 ENCOUNTER — Other Ambulatory Visit (INDEPENDENT_AMBULATORY_CARE_PROVIDER_SITE_OTHER): Payer: Self-pay | Admitting: Internal Medicine

## 2017-06-29 ENCOUNTER — Other Ambulatory Visit: Payer: Self-pay | Admitting: Obstetrics and Gynecology

## 2017-06-29 DIAGNOSIS — Z1231 Encounter for screening mammogram for malignant neoplasm of breast: Secondary | ICD-10-CM

## 2017-06-30 ENCOUNTER — Other Ambulatory Visit: Payer: Medicare Other | Admitting: Obstetrics and Gynecology

## 2017-06-30 ENCOUNTER — Ambulatory Visit (HOSPITAL_COMMUNITY): Payer: BLUE CROSS/BLUE SHIELD | Admitting: Psychiatry

## 2017-07-07 ENCOUNTER — Ambulatory Visit (INDEPENDENT_AMBULATORY_CARE_PROVIDER_SITE_OTHER): Payer: BLUE CROSS/BLUE SHIELD | Admitting: Psychiatry

## 2017-07-07 ENCOUNTER — Ambulatory Visit (HOSPITAL_COMMUNITY): Payer: Self-pay

## 2017-07-07 VITALS — BP 125/79 | HR 100 | Ht 63.0 in | Wt 156.0 lb

## 2017-07-07 DIAGNOSIS — K589 Irritable bowel syndrome without diarrhea: Secondary | ICD-10-CM | POA: Diagnosis not present

## 2017-07-07 DIAGNOSIS — F419 Anxiety disorder, unspecified: Secondary | ICD-10-CM | POA: Diagnosis not present

## 2017-07-07 DIAGNOSIS — F332 Major depressive disorder, recurrent severe without psychotic features: Secondary | ICD-10-CM | POA: Diagnosis not present

## 2017-07-07 DIAGNOSIS — Z818 Family history of other mental and behavioral disorders: Secondary | ICD-10-CM

## 2017-07-07 DIAGNOSIS — Z62811 Personal history of psychological abuse in childhood: Secondary | ICD-10-CM

## 2017-07-07 DIAGNOSIS — Z79899 Other long term (current) drug therapy: Secondary | ICD-10-CM

## 2017-07-07 DIAGNOSIS — G43909 Migraine, unspecified, not intractable, without status migrainosus: Secondary | ICD-10-CM | POA: Diagnosis not present

## 2017-07-07 DIAGNOSIS — Z62810 Personal history of physical and sexual abuse in childhood: Secondary | ICD-10-CM | POA: Diagnosis not present

## 2017-07-07 MED ORDER — DULOXETINE HCL 60 MG PO CPEP: 60.0000 mg | ORAL_CAPSULE | Freq: Every day | ORAL | 3 refills | Status: DC

## 2017-07-07 MED ORDER — HYDROXYZINE HCL 25 MG PO TABS: ORAL_TABLET | ORAL | 3 refills | Status: DC
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
## 2017-07-08 ENCOUNTER — Encounter (INDEPENDENT_AMBULATORY_CARE_PROVIDER_SITE_OTHER): Payer: Self-pay | Admitting: Internal Medicine

## 2017-07-11 ENCOUNTER — Other Ambulatory Visit: Payer: Medicare Other | Admitting: Obstetrics and Gynecology

## 2017-07-18 ENCOUNTER — Encounter (HOSPITAL_COMMUNITY): Payer: Self-pay | Admitting: Psychiatry

## 2017-07-21 ENCOUNTER — Other Ambulatory Visit: Payer: Medicare Other | Admitting: Obstetrics and Gynecology

## 2017-07-22 ENCOUNTER — Ambulatory Visit (INDEPENDENT_AMBULATORY_CARE_PROVIDER_SITE_OTHER): Payer: BLUE CROSS/BLUE SHIELD | Admitting: Obstetrics and Gynecology

## 2017-07-22 ENCOUNTER — Encounter: Payer: Self-pay | Admitting: Obstetrics and Gynecology

## 2017-07-22 VITALS — BP 134/78 | HR 94 | Ht 63.75 in | Wt 155.4 lb

## 2017-07-22 DIAGNOSIS — Z01419 Encounter for gynecological examination (general) (routine) without abnormal findings: Secondary | ICD-10-CM | POA: Diagnosis not present

## 2017-07-22 NOTE — Progress Notes (Addendum)
Assessment:  Annual Gyn Exam  Plan:  1. pap smear done 01/16/2015 (normal), next pap not needed due to vaginal hysterectomy 2. return annually or prn 3    Annual mammogram advised after age 51 4    CBC and TSH ordered, lipids cmet  Subjective:  Marie Hurley is a 51 y.o. female No obstetric history on file. who presents for annual exam. Patient's last menstrual period was 05/27/2015. The patient has no complaints today. Pt has mammograms every year. Pt has done colonoscopies. Pt does not have a PCP, and wants to get her blood-work done through Dr. Glo Herring. Pt had a rectocele and a cystocele done with her vaginal hysterectomy. She has concerns about her bladder dropping again  The following portions of the patient's history were reviewed and updated as appropriate: allergies, current medications, past family history, past medical history, past social history, past surgical history and problem list. 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 %   Migraine    PONV (postoperative nausea and vomiting)    Seasonal allergies    Severe major depressive disorder ([place])    Suicidal behavior     Past Surgical History:  Procedure Laterality Date   ABDOMINAL HYSTERECTOMY     CARPAL TUNNEL RELEASE     rt   CHOLECYSTECTOMY  2002   lap choli   COLONOSCOPY     COLONOSCOPY WITH PROPOFOL N/A 02/07/2014   Procedure: COLONOSCOPY WITH PROPOFOL;  Surgeon: Rogene Houston, MD;  Location: [facility];  Service: Endoscopy;  Laterality: N/A;  entered cecum @ 0756 ; total cecal withdrawal time- 9 min   CYSTOCELE  REPAIR N/A 06/10/2015   Procedure: ANTERIOR REPAIR (CYSTOCELE);  Surgeon: [name], MD;  Location: [facility];  Service: Gynecology;  Laterality: N/A;   DIAGNOSTIC LAPAROSCOPY  2007   exploratory-nic bowel duct   DILATION AND CURETTAGE OF UTERUS  '91 & '96   ERCP  2005,2007,2013   several   HEMORROIDECTOMY     RECTOCELE REPAIR N/A 06/10/2015   Procedure: POSTERIOR REPAIR (RECTOCELE);  Surgeon: [name], MD;  Location: [facility];  Service: Gynecology;  Laterality: N/A;   SHOULDER ARTHROSCOPY WITH SUBACROMIAL DECOMPRESSION, ROTATOR CUFF REPAIR AND BICEP TENDON REPAIR Right 05/24/2014   Procedure: RIGHT SHOULDER ARTHROSCOPY DEBRIDEMENT LIMITED, DECOMPRESSION SUBACROMIAL PARTIAL ACROMIOPLASTY WITH ROTATOR CUFF REPAIR;  Surgeon: Johnny Bridge, MD;  Location: [facility];  Service: Orthopedics;  Laterality: Right;   TUBAL LIGATION     VAGINAL HYSTERECTOMY N/A 06/10/2015   Procedure: HYSTERECTOMY VAGINAL;  Surgeon: [name], MD;  Location: [facility];  Service: Gynecology;  Laterality: N/A;     Current Outpatient Prescriptions:    cetirizine (ZYRTEC) 10 MG tablet, Take 1 tablet (10 mg total) by mouth daily. For allergies, Disp: , Rfl:    dicyclomine (BENTYL) 20 MG tablet, TAKE ONE TABLET BY MOUTH FOUR TIMES DAILY BEFORE MEALS AND AT BEDTIME, Disp: 90 tablet, Rfl: 4   DULoxetine (CYMBALTA) 60 MG capsule, Take 1  capsule (60 mg total) by mouth daily., Disp: 30 capsule, Rfl: 3   esomeprazole (NEXIUM) 40 MG capsule, Take 1 capsule (40 mg total) by mouth 2 (two) times daily. For acid reflux, Disp: 1 capsule, Rfl: 0   esomeprazole (NEXIUM) 40 MG capsule, TAKE ONE CAPSULE BY MOUTH TWICE DAILY, Disp: 60 capsule, Rfl: 3   estradiol (ESTRACE) 1 MG tablet, TAKE ONE TABLET BY MOUTH EVERY DAY, Disp: 90 tablet, Rfl: 3   hydrOXYzine (ATARAX/VISTARIL) 25 MG tablet, Take 1 tablet (25 mg) four times daily as needed: For anxiety, Disp: 120 tablet, Rfl: 3   ibuprofen  (ADVIL,MOTRIN) 200 MG tablet, Take by mouth., Disp: , Rfl:    LINZESS 290 MCG CAPS capsule, Take 1 capsule (290 mcg total) by mouth daily. For constipation, Disp: 1 capsule, Rfl: 0   meclizine (ANTIVERT) 25 MG tablet, Take 25 mg by mouth 3 (three) times daily as needed for dizziness., Disp: , Rfl:    Multiple Vitamin (DAILY VITAMIN) tablet, Take 1 tablet by mouth daily. For low Vitamin, Disp: , Rfl:    ondansetron (ZOFRAN) 4 MG tablet, Take 1 tablet (4 mg total) by mouth every 8 (eight) hours as needed for nausea or vomiting., Disp: 30 tablet, Rfl: 0   polyethylene glycol powder (GLYCOLAX/MIRALAX) powder, TAKE 34 GRAMS (TWO CAPFULS) IN 8 OZ OF FLUID TWICE DAILY, Disp: , Rfl: 5   polyethylene glycol powder (GLYCOLAX/MIRALAX) powder, TAKE 34 GRAMS (TWO CAPFULS) IN 8 OZ OF FLUID TWICE DAILY, Disp: 2108 g, Rfl: 2   Pramoxine-HC (HYDROCORTISONE ACE-PRAMOXINE) 2.5-1 % CREA, Apply topically., Disp: , Rfl:    traZODone (DESYREL) 50 MG tablet, Take 1 tablet (50 mg total) by mouth at bedtime and may repeat dose one time if needed. For sleep, Disp: 60 tablet, Rfl: 3  Review of Systems Constitutional: negative Gastrointestinal: negative Genitourinary: Negative  Objective:  LMP 05/27/2015  .Body mass index is 26.88 kg/m.   BMI: There is no height or weight on file to calculate BMI.  General Appearance: Alert, appropriate appearance for age. No acute distress HEENT: Grossly normal Breast Exam: No masses or nodes.No dimpling, nipple retraction or discharge. Gastrointestinal: Soft, non-tender, no masses or organomegaly Pelvic Exam:  External genitalia: normal general appearance Vaginal: normal mucosa without prolapse or lesions, good support, healthy tissues Cervix: absent and removed surgically Adnexa: normal Uterus: absent and removed surgically Rectovaginal: normal rectal, no masses and guaiac negative stool obtained Lymphatic Exam: Non-palpable nodes in neck, clavicular, axillary, or  inguinal regions  Skin: no rash or abnormalities Neurologic: Normal gait and speech, no tremor  Psychiatric: Alert and oriented, appropriate affect.  Urinalysis:Not done  Mallory Shirk. MD Pgr [phone] 10:38 AM     By signing my name below, I, Jabier Gauss, attest that this documentation has been prepared under the direction and in the presence of [name], MD. Electronically Signed: Jabier Gauss, Medical Scribe. 07/22/17. 10:38 AM.  I personally performed the services described in this documentation, which was SCRIBED in my presence. The recorded information has been reviewed and considered accurate. It has been edited as necessary during review. [name], MD

## 2017-07-24 LAB — TSH: TSH: 1.06 u[IU]/mL (ref 0.450–4.500)

## 2017-07-24 LAB — COMPREHENSIVE METABOLIC PANEL
ALBUMIN: 4.2 g/dL (ref 3.5–5.5)
ALK PHOS: 71 IU/L (ref 39–117)
ALT: 14 IU/L (ref 0–32)
AST: 17 IU/L (ref 0–40)
Albumin/Globulin Ratio: 1.3 (ref 1.2–2.2)
BUN / CREAT RATIO: 17 (ref 9–23)
BUN: 14 mg/dL (ref 6–24)
Bilirubin Total: 0.3 mg/dL (ref 0.0–1.2)
CALCIUM: 9.5 mg/dL (ref 8.7–10.2)
CO2: 25 mmol/L (ref 20–29)
CREATININE: 0.83 mg/dL (ref 0.57–1.00)
Chloride: 100 mmol/L (ref 96–106)
GFR calc Af Amer: 93 mL/min/{1.73_m2} (ref >59)
GFR, EST NON AFRICAN AMERICAN: 81 mL/min/{1.73_m2} (ref >59)
GLOBULIN, TOTAL: 3.3 g/dL (ref 1.5–4.5)
GLUCOSE: 85 mg/dL (ref 65–99)
Potassium: 4.5 mmol/L (ref 3.5–5.2)
SODIUM: 138 mmol/L (ref 134–144)
Total Protein: 7.5 g/dL (ref 6.0–8.5)

## 2017-07-24 LAB — CBC
Hematocrit: 34.8 % (ref 34.0–46.6)
Hemoglobin: 10.5 g/dL — ABNORMAL LOW (ref 11.1–15.9)
MCH: 23.4 pg — AB (ref 26.6–33.0)
MCHC: 30.2 g/dL — AB (ref 31.5–35.7)
MCV: 78 fL — ABNORMAL LOW (ref 79–97)
PLATELETS: 386 10*3/uL — AB (ref 150–379)
RBC: 4.48 x10E6/uL (ref 3.77–5.28)
RDW: 18.6 % — ABNORMAL HIGH (ref 12.3–15.4)
WBC: 6.3 10*3/uL (ref 3.4–10.8)

## 2017-07-24 LAB — LIPID PANEL
CHOL/HDL RATIO: 2.7 ratio (ref 0.0–4.4)
CHOLESTEROL TOTAL: 231 mg/dL — AB (ref 100–199)
HDL: 87 mg/dL (ref >39)
LDL CALC: 129 mg/dL — AB (ref 0–99)
TRIGLYCERIDES: 76 mg/dL (ref 0–149)
VLDL Cholesterol Cal: 15 mg/dL (ref 5–40)

## 2017-07-29 ENCOUNTER — Telehealth: Payer: Self-pay | Admitting: Obstetrics and Gynecology

## 2017-07-29 ENCOUNTER — Ambulatory Visit (HOSPITAL_COMMUNITY)
Admission: RE | Admit: 2017-07-29 | Discharge: 2017-07-29 | Disposition: A | Discharge status: Home / Self Care | Payer: BLUE CROSS/BLUE SHIELD | Source: Ambulatory Visit | Attending: Obstetrics and Gynecology | Admitting: Obstetrics and Gynecology

## 2017-07-29 DIAGNOSIS — Z1231 Encounter for screening mammogram for malignant neoplasm of breast: Secondary | ICD-10-CM | POA: Insufficient documentation

## 2017-07-29 NOTE — Telephone Encounter (Signed)
Informed patient per Dr Glo Herring most labs excellent: lipids elevated with total at 231. BUT, the HDL, the good cholesterol, is extremely high which is good and rare. Patient verbalized gratitude.

## 2017-08-02 ENCOUNTER — Telehealth: Payer: Self-pay | Admitting: *Deleted

## 2017-08-02 NOTE — Telephone Encounter (Signed)
Advised pt of normal mammogram, per Dr Glo Herring.

## 2017-09-27 ENCOUNTER — Ambulatory Visit (INDEPENDENT_AMBULATORY_CARE_PROVIDER_SITE_OTHER): Payer: Self-pay | Admitting: Internal Medicine

## 2017-10-25 ENCOUNTER — Ambulatory Visit (INDEPENDENT_AMBULATORY_CARE_PROVIDER_SITE_OTHER): Payer: BLUE CROSS/BLUE SHIELD | Admitting: Psychiatry

## 2017-10-25 ENCOUNTER — Encounter (HOSPITAL_COMMUNITY): Payer: Self-pay | Admitting: Psychiatry

## 2017-10-25 VITALS — BP 142/87 | HR 98 | Ht 63.0 in | Wt 161.0 lb

## 2017-10-25 DIAGNOSIS — F332 Major depressive disorder, recurrent severe without psychotic features: Secondary | ICD-10-CM | POA: Diagnosis not present

## 2017-10-25 DIAGNOSIS — G4700 Insomnia, unspecified: Secondary | ICD-10-CM

## 2017-10-25 DIAGNOSIS — Z736 Limitation of activities due to disability: Secondary | ICD-10-CM | POA: Diagnosis not present

## 2017-10-25 DIAGNOSIS — R109 Unspecified abdominal pain: Secondary | ICD-10-CM | POA: Diagnosis not present

## 2017-10-25 DIAGNOSIS — F419 Anxiety disorder, unspecified: Secondary | ICD-10-CM | POA: Diagnosis not present

## 2017-10-25 DIAGNOSIS — K589 Irritable bowel syndrome without diarrhea: Secondary | ICD-10-CM

## 2017-10-25 DIAGNOSIS — G43909 Migraine, unspecified, not intractable, without status migrainosus: Secondary | ICD-10-CM | POA: Diagnosis not present

## 2017-10-25 MED ORDER — HYDROXYZINE HCL 25 MG PO TABS: ORAL_TABLET | ORAL | 3 refills | Status: DC

## 2017-10-25 MED ORDER — DULOXETINE HCL 60 MG PO CPEP: 60.0000 mg | ORAL_CAPSULE | Freq: Every day | ORAL | 3 refills | Status: DC

## 2017-10-25 MED ORDER — TRAZODONE HCL 50 MG PO TABS: 50.0000 mg | ORAL_TABLET | ORAL | 3 refills | Status: DC | PRN
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
## 2017-11-03 ENCOUNTER — Other Ambulatory Visit (INDEPENDENT_AMBULATORY_CARE_PROVIDER_SITE_OTHER): Payer: Self-pay | Admitting: Internal Medicine

## 2017-11-29 ENCOUNTER — Ambulatory Visit (INDEPENDENT_AMBULATORY_CARE_PROVIDER_SITE_OTHER): Payer: BLUE CROSS/BLUE SHIELD | Admitting: Internal Medicine

## 2017-11-29 ENCOUNTER — Encounter (INDEPENDENT_AMBULATORY_CARE_PROVIDER_SITE_OTHER): Payer: Self-pay | Admitting: Internal Medicine

## 2017-11-29 VITALS — BP 116/78 | HR 76 | Temp 98.2°F | Resp 18 | Ht 63.0 in | Wt 159.4 lb

## 2017-11-29 DIAGNOSIS — K219 Gastro-esophageal reflux disease without esophagitis: Secondary | ICD-10-CM | POA: Diagnosis not present

## 2017-11-29 DIAGNOSIS — D508 Other iron deficiency anemias: Secondary | ICD-10-CM | POA: Diagnosis not present

## 2017-11-29 DIAGNOSIS — R1011 Right upper quadrant pain: Secondary | ICD-10-CM

## 2017-11-29 MED ORDER — FLINTSTONES PLUS IRON PO CHEW: 1.0000 | CHEWABLE_TABLET | Freq: Two times a day (BID) | ORAL | Status: DC

## 2017-11-29 NOTE — Progress Notes (Signed)
Presenting complaint;  Follow-up for abdominal pain GERD and constipation.  Subjective:  Patient is 52 year old Caucasian female who is here for scheduled visit.  She was last seen in November 2017.  Patient states she is still having right upper quadrant pain at least once a day.  Pain is not as intense as it used to be.  Sometimes she has this pain when she stands up from a sitting position or makes sudden movement.  This pain is not associated with nausea or vomiting.  She says heartburn is well controlled with double dose PPI.  She had an episode of nausea vomiting last night and self-limiting diarrhea this morning.  She has at least 6 bowel movements a week.  If she does not have a bowel movement she will take MiraLAX in the afternoon and if she has no results in 6 hours she will take a second dose.  She has tried suppository and Fleet Enema in the past but these medications did not help.  She denies melena or rectal bleeding.  She also denies dysphagia.  She has gained 23 pounds in the last 16 months. She is aware of the weight gain.  She says she is not physically very active.   Current Medications: Outpatient Encounter Medications as of 11/29/2017  Medication Sig  . cetirizine (ZYRTEC) 10 MG tablet Take 1 tablet (10 mg total) by mouth daily. For allergies  . dicyclomine (BENTYL) 20 MG tablet TAKE ONE TABLET BY MOUTH FOUR TIMES DAILY BEFORE MEALS AND AT BEDTIME (Patient taking differently: TAKE ONE TABLET BY MOUTH FOUR TIMES DAILY BEFORE MEALS AND AT BEDTIME PRN)  . DULoxetine (CYMBALTA) 60 MG capsule Take 1 capsule (60 mg total) by mouth daily.  Marland Kitchen esomeprazole (NEXIUM) 40 MG capsule TAKE ONE CAPSULE BY MOUTH TWICE DAILY  . estradiol (ESTRACE) 1 MG tablet TAKE ONE TABLET BY MOUTH EVERY DAY  . hydrOXYzine (ATARAX/VISTARIL) 25 MG tablet Take 1 tablet (25 mg) four times daily as needed: For anxiety  . ibuprofen (ADVIL,MOTRIN) 200 MG tablet Take by mouth.  Marland Kitchen LINZESS 290 MCG CAPS capsule Take 1  capsule (290 mcg total) by mouth daily. For constipation  . Multiple Vitamin (DAILY VITAMIN) tablet Take 1 tablet by mouth daily. For low Vitamin  . polyethylene glycol powder (GLYCOLAX/MIRALAX) powder TAKE 34 GRAMS (TWO CAPFULS) IN 8 OZ OF FLUID TWICE DAILY  . traZODone (DESYREL) 50 MG tablet Take 1 tablet (50 mg total) by mouth as needed. For sleep   No facility-administered encounter medications on file as of 11/29/2017.      Objective: Blood pressure 116/78, pulse 76, temperature 98.2 F (36.8 C), temperature source Oral, resp. rate 18, height 5\' 3"  (1.6 m), weight 159 lb 6.4 oz (72.3 kg), last menstrual period 05/27/2015. Patient is alert and in no acute distress. Conjunctiva is pink. Sclera is nonicteric Oropharyngeal mucosa is normal. No neck masses or thyromegaly noted. Cardiac exam with regular rhythm normal S1 and S2. No murmur or gallop noted. Lungs are clear to auscultation. Abdomen is symmetrical.  On palpation abdomen is soft.  She has mild tenderness below the right costal margin.  No organomegaly or masses. No LE edema or clubbing noted.  Labs/studies Results: Lab data from 07/22/2017 H&H 10.5 and 34.8 and MCV 78. Bilirubin 0.3, AP 71, AST 17, ALT 14 and albumin 4.2.  Assessment:  #1.  GERD.  She is doing well with double dose PPI.  In the past dose reduction has not worked.  Will try using H2  B in the evening and see if it works.  #2.  Right upper quadrant abdominal pain.  She has history of sphincter of Oddi dysfunction and has undergone multiple ERCPs in the past.  She has not had typical episode in the last 1 year.  Some of her pain appears to be due to wall pain and IBS.  #3.  Anemia.  Her MCV has been gradually decreasing.  Suspect impaired iron absorption due to chronic acid suppression.  She is up-to-date on screening for CRC.  Last colonoscopy was in April 2015.  #4. Chronic constipation.  She is doing well with therapy.  Plan:  Continue a.m. dose of  Nexium as before. Try Pepcid OTC 20 mg or Zantac OTC 150 mg in the evening in place of Nexium.  Try this for at least 1 week.  If this change in effective can go back on Nexium 40 mg twice daily. [place] chewable with iron 1 tablet p.o. twice daily. H&H in 3 months. Office visit in 1 year.      '

## 2017-11-29 NOTE — Patient Instructions (Signed)
Take Pepcid OTC 20 mg or Zantac OTC 150 mg in the evening in place of Nexium.  Continue a.m. dose of Nexium as before.  If this combination does not work can go back to Nexium 40 mg twice daily. H&H in 3 months.

## 2018-01-24 ENCOUNTER — Encounter (HOSPITAL_COMMUNITY): Payer: Self-pay | Admitting: Psychiatry

## 2018-01-24 ENCOUNTER — Ambulatory Visit (INDEPENDENT_AMBULATORY_CARE_PROVIDER_SITE_OTHER): Payer: BLUE CROSS/BLUE SHIELD | Admitting: Psychiatry

## 2018-01-24 VITALS — BP 118/79 | HR 95 | Ht 63.0 in | Wt 159.0 lb

## 2018-01-24 DIAGNOSIS — F419 Anxiety disorder, unspecified: Secondary | ICD-10-CM | POA: Diagnosis not present

## 2018-01-24 DIAGNOSIS — Z818 Family history of other mental and behavioral disorders: Secondary | ICD-10-CM

## 2018-01-24 DIAGNOSIS — K589 Irritable bowel syndrome without diarrhea: Secondary | ICD-10-CM

## 2018-01-24 DIAGNOSIS — F332 Major depressive disorder, recurrent severe without psychotic features: Secondary | ICD-10-CM

## 2018-01-24 DIAGNOSIS — G43909 Migraine, unspecified, not intractable, without status migrainosus: Secondary | ICD-10-CM

## 2018-01-24 DIAGNOSIS — Z736 Limitation of activities due to disability: Secondary | ICD-10-CM

## 2018-01-24 MED ORDER — DULOXETINE HCL 60 MG PO CPEP: 60.0000 mg | ORAL_CAPSULE | Freq: Every day | ORAL | 3 refills | Status: DC

## 2018-01-24 MED ORDER — TRAZODONE HCL 50 MG PO TABS: 50.0000 mg | ORAL_TABLET | ORAL | 3 refills | Status: DC | PRN
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
## 2018-04-03 ENCOUNTER — Other Ambulatory Visit (INDEPENDENT_AMBULATORY_CARE_PROVIDER_SITE_OTHER): Payer: Self-pay | Admitting: Internal Medicine

## 2018-04-03 ENCOUNTER — Other Ambulatory Visit: Payer: Self-pay | Admitting: Obstetrics and Gynecology

## 2018-04-04 NOTE — Telephone Encounter (Signed)
refil estradiol. It will be 2 yr since last visit as of September. Will send recall

## 2018-05-01 ENCOUNTER — Other Ambulatory Visit (HOSPITAL_COMMUNITY): Payer: Self-pay | Admitting: Psychiatry

## 2018-05-22 ENCOUNTER — Other Ambulatory Visit (HOSPITAL_COMMUNITY): Payer: Self-pay | Admitting: Psychiatry

## 2018-05-22 MED ORDER — TRAZODONE HCL 50 MG PO TABS: 50.0000 mg | ORAL_TABLET | ORAL | 0 refills | Status: DC | PRN

## 2018-05-26 ENCOUNTER — Encounter (HOSPITAL_COMMUNITY): Payer: Self-pay | Admitting: Psychiatry

## 2018-05-26 ENCOUNTER — Ambulatory Visit (INDEPENDENT_AMBULATORY_CARE_PROVIDER_SITE_OTHER): Payer: BLUE CROSS/BLUE SHIELD | Admitting: Psychiatry

## 2018-05-26 VITALS — BP 122/82 | HR 91 | Ht 63.0 in | Wt 157.0 lb

## 2018-05-26 DIAGNOSIS — Z818 Family history of other mental and behavioral disorders: Secondary | ICD-10-CM

## 2018-05-26 DIAGNOSIS — F332 Major depressive disorder, recurrent severe without psychotic features: Secondary | ICD-10-CM

## 2018-05-26 MED ORDER — DULOXETINE HCL 60 MG PO CPEP: 60.0000 mg | ORAL_CAPSULE | Freq: Every day | ORAL | 3 refills | Status: DC

## 2018-05-26 MED ORDER — HYDROXYZINE HCL 25 MG PO TABS: ORAL_TABLET | ORAL | 3 refills | Status: DC

## 2018-05-26 MED ORDER — TRAZODONE HCL 50 MG PO TABS: 50.0000 mg | ORAL_TABLET | ORAL | 3 refills | Status: DC | PRN
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
## 2018-06-06 ENCOUNTER — Other Ambulatory Visit (INDEPENDENT_AMBULATORY_CARE_PROVIDER_SITE_OTHER): Payer: Self-pay | Admitting: Internal Medicine

## 2018-08-03 ENCOUNTER — Other Ambulatory Visit: Payer: Medicare Other | Admitting: Obstetrics and Gynecology

## 2018-08-18 ENCOUNTER — Other Ambulatory Visit: Payer: Medicare Other | Admitting: Obstetrics and Gynecology

## 2018-09-01 ENCOUNTER — Other Ambulatory Visit (INDEPENDENT_AMBULATORY_CARE_PROVIDER_SITE_OTHER): Payer: Self-pay | Admitting: Internal Medicine

## 2018-09-01 ENCOUNTER — Other Ambulatory Visit: Payer: Self-pay | Admitting: Obstetrics and Gynecology

## 2018-09-01 ENCOUNTER — Other Ambulatory Visit (HOSPITAL_COMMUNITY): Payer: Self-pay | Admitting: Psychiatry

## 2018-09-01 DIAGNOSIS — Z1231 Encounter for screening mammogram for malignant neoplasm of breast: Secondary | ICD-10-CM

## 2018-09-25 ENCOUNTER — Ambulatory Visit (HOSPITAL_COMMUNITY)
Admission: RE | Admit: 2018-09-25 | Discharge: 2018-09-25 | Disposition: A | Discharge status: Home / Self Care | Payer: Managed Care, Other (non HMO) | Source: Ambulatory Visit | Attending: Obstetrics and Gynecology | Admitting: Obstetrics and Gynecology

## 2018-09-25 ENCOUNTER — Ambulatory Visit (HOSPITAL_COMMUNITY): Payer: 59 | Admitting: Psychiatry

## 2018-09-25 ENCOUNTER — Encounter (HOSPITAL_COMMUNITY): Payer: Self-pay | Admitting: Psychiatry

## 2018-09-25 ENCOUNTER — Encounter (HOSPITAL_COMMUNITY): Payer: Self-pay

## 2018-09-25 VITALS — BP 150/86 | HR 82 | Ht 63.0 in | Wt 158.0 lb

## 2018-09-25 DIAGNOSIS — Z1231 Encounter for screening mammogram for malignant neoplasm of breast: Secondary | ICD-10-CM

## 2018-09-25 DIAGNOSIS — F332 Major depressive disorder, recurrent severe without psychotic features: Secondary | ICD-10-CM | POA: Diagnosis not present

## 2018-09-25 MED ORDER — HYDROXYZINE HCL 25 MG PO TABS: ORAL_TABLET | ORAL | 3 refills | Status: DC

## 2018-09-25 MED ORDER — DULOXETINE HCL 60 MG PO CPEP: 60.0000 mg | ORAL_CAPSULE | Freq: Every day | ORAL | 3 refills | Status: DC

## 2018-09-25 MED ORDER — TRAZODONE HCL 50 MG PO TABS: 50.0000 mg | ORAL_TABLET | ORAL | 3 refills | Status: DC | PRN
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
## 2018-09-26 ENCOUNTER — Other Ambulatory Visit: Payer: Medicare Other | Admitting: Adult Health

## 2018-09-28 ENCOUNTER — Other Ambulatory Visit: Payer: Self-pay | Admitting: Obstetrics and Gynecology

## 2018-09-28 ENCOUNTER — Encounter: Payer: Self-pay | Admitting: Internal Medicine

## 2019-01-01 ENCOUNTER — Other Ambulatory Visit (HOSPITAL_COMMUNITY): Payer: Self-pay | Admitting: Psychiatry

## 2019-01-16 ENCOUNTER — Ambulatory Visit (INDEPENDENT_AMBULATORY_CARE_PROVIDER_SITE_OTHER): Payer: Self-pay | Admitting: Internal Medicine

## 2019-01-25 ENCOUNTER — Encounter (HOSPITAL_COMMUNITY): Payer: Self-pay | Admitting: Psychiatry

## 2019-01-25 ENCOUNTER — Other Ambulatory Visit: Payer: Self-pay

## 2019-01-25 ENCOUNTER — Ambulatory Visit (INDEPENDENT_AMBULATORY_CARE_PROVIDER_SITE_OTHER): Payer: Managed Care, Other (non HMO) | Admitting: Psychiatry

## 2019-01-25 DIAGNOSIS — F332 Major depressive disorder, recurrent severe without psychotic features: Secondary | ICD-10-CM | POA: Diagnosis not present

## 2019-01-25 DIAGNOSIS — R42 Dizziness and giddiness: Secondary | ICD-10-CM | POA: Diagnosis not present

## 2019-01-25 DIAGNOSIS — F419 Anxiety disorder, unspecified: Secondary | ICD-10-CM | POA: Diagnosis not present

## 2019-01-25 MED ORDER — TRAZODONE HCL 50 MG PO TABS: 50.0000 mg | ORAL_TABLET | ORAL | 3 refills | Status: DC | PRN

## 2019-01-25 MED ORDER — HYDROXYZINE HCL 25 MG PO TABS: ORAL_TABLET | ORAL | 3 refills | Status: DC

## 2019-01-25 MED ORDER — DULOXETINE HCL 60 MG PO CPEP: 60.0000 mg | ORAL_CAPSULE | Freq: Every day | ORAL | 3 refills | Status: DC
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
## 2019-01-30 ENCOUNTER — Encounter (INDEPENDENT_AMBULATORY_CARE_PROVIDER_SITE_OTHER): Payer: Self-pay | Admitting: Internal Medicine

## 2019-01-30 ENCOUNTER — Ambulatory Visit (INDEPENDENT_AMBULATORY_CARE_PROVIDER_SITE_OTHER): Payer: Managed Care, Other (non HMO) | Admitting: Internal Medicine

## 2019-01-30 ENCOUNTER — Other Ambulatory Visit: Payer: Self-pay

## 2019-01-30 VITALS — BP 145/76 | Temp 98.6°F | Resp 18 | Ht 63.0 in | Wt 162.0 lb

## 2019-01-30 DIAGNOSIS — K5909 Other constipation: Secondary | ICD-10-CM

## 2019-01-30 DIAGNOSIS — K219 Gastro-esophageal reflux disease without esophagitis: Secondary | ICD-10-CM

## 2019-01-30 DIAGNOSIS — R1011 Right upper quadrant pain: Secondary | ICD-10-CM

## 2019-01-30 MED ORDER — FAMOTIDINE 20 MG PO TABS: 20.0000 mg | ORAL_TABLET | Freq: Every day | ORAL | 3 refills | Status: DC

## 2019-01-30 NOTE — Progress Notes (Addendum)
Virtual Visit via Telephone Note  Patient had office visit scheduled for today.  Office visit was canceled because of ongoing COVID-19 pandemic.  Patient wanted to proceed with telemedicine I agreed. I connected with Dorthula Nettles on 01/30/19 at  2:00 PM EDT by telephone and verified that I am speaking with the correct person using two identifiers.   I discussed the limitations, risks, security and privacy concerns of performing an evaluation and management service by telephone and the availability of in person appointments. I also discussed with the patient that there may be a patient responsible charge related to this service. The patient expressed understanding and agreed to proceed.   History of Present Illness: Patient is 53 year old Caucasian female who has chronic right upper quadrant abdominal pain as well as chronic GERD and constipation who was last seen in February 2019. She says she continues to do well as far as her GERD symptoms are concerned.  Medication is working.  Similarly her bowels move as long as she takes Linzess and polyethylene glycol.  If she does not have a bowel movement by end of the day she will take second dose of poly-illegal alcohol.  If she does she will just take 1 dose in the morning.  She states Nexium is working and only time she has heartburn is if she eats food that she should not to begin with.  She does not always take a second dose of Nexium.  She feels she takes maybe 45 doses per month.  She had 1 such episode after eating fried fish recently.  She denies dysphagia nausea or vomiting. She continues to experience right upper quadrant pain.  It happens 2-3 times a week.  It is not associated with nausea or vomiting.  Pain does not appear to be triggered with meals.  She says when this pain occurs it is grabbing and squeezing and side of immobilizes her.  It is not severe like it used to be and it lasts for only few seconds and no more than 1 minute. Her  appetite is good.  She has not lost any weight. She takes ibuprofen no more than 2-3 times a week.  Most of the time it is for musculoskeletal pain or sciatica.  She takes 400 mg at a time.    Observations/Objective:  Weight 162 pounds.  Assessment and Plan:  #1.  Chronic right upper quadrant pain.  The pain she is having now appears to be wall pain or musculoskeletal pain.  She also has a history of sphincter of 40 dysfunction and has undergone ERCP and balloon dilation of sphincter in the remote past.  She has had LFTs done with this pain and they have always been normal.  Unless pain changes no plans for further work-up.  #2.  Chronic GERD.  She is doing well with therapy.  #3.  Chronic constipation.  She is requiring 2 medication to make her bowels move.  Follow Up Instructions:  Patient advised to keep ibuprofen use to minimum. Will request copy of recent blood work from Dr. Marcial Pacas office.  Office visit in 6 months. I discussed the assessment and treatment plan with the patient. The patient was provided an opportunity to ask questions and all were answered. The patient agreed with the plan and demonstrated an understanding of the instructions.   The patient was advised to call back or seek an in-person evaluation if the symptoms worsen or if the condition fails to improve as anticipated.  I provided  16 minutes of non-face-to-face time during this encounter.   Hildred Laser, MD  Lab data from 09/29/2018 reviewed WBC 9.4, H&H 12.0 and 36.3 and platelet count 407K. Hemoccult negative. Bilirubin 0.4, AP 74, AST 22, ALT 13, total protein 7.5 and albumin 4.4. Serum calcium 9.7 Glucose 88, BUN 15, creatinine 0.9 Total cholesterol 262 triglycerides 148, HDL 62 and LDL 170. Left message on patient's answering service.

## 2019-01-30 NOTE — Patient Instructions (Signed)
Will request copy of recent blood work from Dr. Vyas's office. 

## 2019-02-09 ENCOUNTER — Other Ambulatory Visit (INDEPENDENT_AMBULATORY_CARE_PROVIDER_SITE_OTHER): Payer: Self-pay | Admitting: Internal Medicine

## 2019-02-28 ENCOUNTER — Other Ambulatory Visit (INDEPENDENT_AMBULATORY_CARE_PROVIDER_SITE_OTHER): Payer: Self-pay | Admitting: Internal Medicine

## 2019-03-22 ENCOUNTER — Other Ambulatory Visit (INDEPENDENT_AMBULATORY_CARE_PROVIDER_SITE_OTHER): Payer: Self-pay | Admitting: Internal Medicine

## 2019-05-07 ENCOUNTER — Other Ambulatory Visit (HOSPITAL_COMMUNITY): Payer: Self-pay | Admitting: Psychiatry

## 2019-05-28 ENCOUNTER — Encounter (HOSPITAL_COMMUNITY): Payer: Self-pay | Admitting: Psychiatry

## 2019-05-28 ENCOUNTER — Other Ambulatory Visit: Payer: Self-pay

## 2019-05-28 ENCOUNTER — Ambulatory Visit (INDEPENDENT_AMBULATORY_CARE_PROVIDER_SITE_OTHER): Payer: 59 | Admitting: Psychiatry

## 2019-05-28 DIAGNOSIS — F332 Major depressive disorder, recurrent severe without psychotic features: Secondary | ICD-10-CM

## 2019-05-28 MED ORDER — DULOXETINE HCL 60 MG PO CPEP: 60.0000 mg | ORAL_CAPSULE | Freq: Every day | ORAL | 3 refills | Status: DC

## 2019-05-28 MED ORDER — TRAZODONE HCL 50 MG PO TABS: 50.0000 mg | ORAL_TABLET | ORAL | 3 refills | Status: DC | PRN

## 2019-05-28 MED ORDER — HYDROXYZINE HCL 25 MG PO TABS: ORAL_TABLET | ORAL | 3 refills | Status: DC
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
## 2019-06-30 ENCOUNTER — Other Ambulatory Visit: Payer: Self-pay | Admitting: Obstetrics and Gynecology

## 2019-08-06 ENCOUNTER — Ambulatory Visit (INDEPENDENT_AMBULATORY_CARE_PROVIDER_SITE_OTHER): Payer: Self-pay | Admitting: Internal Medicine

## 2019-08-28 ENCOUNTER — Other Ambulatory Visit (INDEPENDENT_AMBULATORY_CARE_PROVIDER_SITE_OTHER): Payer: Self-pay | Admitting: Internal Medicine

## 2019-09-03 ENCOUNTER — Other Ambulatory Visit (HOSPITAL_COMMUNITY): Payer: Self-pay | Admitting: Psychiatry

## 2019-09-24 ENCOUNTER — Other Ambulatory Visit: Payer: Self-pay | Admitting: Obstetrics and Gynecology

## 2019-09-27 ENCOUNTER — Other Ambulatory Visit: Payer: Self-pay

## 2019-09-27 ENCOUNTER — Ambulatory Visit (INDEPENDENT_AMBULATORY_CARE_PROVIDER_SITE_OTHER): Payer: 59 | Admitting: Psychiatry

## 2019-09-27 ENCOUNTER — Encounter (HOSPITAL_COMMUNITY): Payer: Self-pay | Admitting: Psychiatry

## 2019-09-27 DIAGNOSIS — F332 Major depressive disorder, recurrent severe without psychotic features: Secondary | ICD-10-CM

## 2019-09-27 DIAGNOSIS — F419 Anxiety disorder, unspecified: Secondary | ICD-10-CM

## 2019-09-27 MED ORDER — HYDROXYZINE HCL 25 MG PO TABS: ORAL_TABLET | ORAL | 3 refills | Status: DC

## 2019-09-27 MED ORDER — DULOXETINE HCL 60 MG PO CPEP: 60.0000 mg | ORAL_CAPSULE | Freq: Every day | ORAL | 3 refills | Status: DC

## 2019-09-27 MED ORDER — TRAZODONE HCL 50 MG PO TABS: ORAL_TABLET | ORAL | 3 refills | Status: DC
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
## 2019-10-03 ENCOUNTER — Other Ambulatory Visit (INDEPENDENT_AMBULATORY_CARE_PROVIDER_SITE_OTHER): Payer: Self-pay | Admitting: *Deleted

## 2019-10-03 DIAGNOSIS — K219 Gastro-esophageal reflux disease without esophagitis: Secondary | ICD-10-CM

## 2019-10-03 MED ORDER — ESOMEPRAZOLE MAGNESIUM 40 MG PO CPDR: 40.0000 mg | DELAYED_RELEASE_CAPSULE | Freq: Two times a day (BID) | ORAL | 3 refills | Status: DC

## 2019-10-18 ENCOUNTER — Other Ambulatory Visit (HOSPITAL_COMMUNITY): Payer: Self-pay | Admitting: Nurse Practitioner

## 2019-10-18 DIAGNOSIS — Z1231 Encounter for screening mammogram for malignant neoplasm of breast: Secondary | ICD-10-CM

## 2019-10-31 ENCOUNTER — Ambulatory Visit (HOSPITAL_COMMUNITY): Payer: Self-pay

## 2019-11-05 ENCOUNTER — Encounter (INDEPENDENT_AMBULATORY_CARE_PROVIDER_SITE_OTHER): Payer: Self-pay

## 2019-11-08 ENCOUNTER — Ambulatory Visit (HOSPITAL_COMMUNITY)
Admission: RE | Admit: 2019-11-08 | Discharge: 2019-11-08 | Disposition: A | Discharge status: Home / Self Care | Payer: 59 | Source: Ambulatory Visit | Attending: Nurse Practitioner | Admitting: Nurse Practitioner

## 2019-11-08 ENCOUNTER — Other Ambulatory Visit: Payer: Self-pay

## 2019-11-08 DIAGNOSIS — Z1231 Encounter for screening mammogram for malignant neoplasm of breast: Secondary | ICD-10-CM | POA: Diagnosis present

## 2019-11-13 ENCOUNTER — Ambulatory Visit (INDEPENDENT_AMBULATORY_CARE_PROVIDER_SITE_OTHER): Payer: 59 | Admitting: Internal Medicine

## 2019-11-13 ENCOUNTER — Other Ambulatory Visit: Payer: Self-pay

## 2019-11-13 ENCOUNTER — Encounter (INDEPENDENT_AMBULATORY_CARE_PROVIDER_SITE_OTHER): Payer: Self-pay | Admitting: Internal Medicine

## 2019-11-13 DIAGNOSIS — K644 Residual hemorrhoidal skin tags: Secondary | ICD-10-CM | POA: Insufficient documentation

## 2019-11-13 DIAGNOSIS — K5909 Other constipation: Secondary | ICD-10-CM | POA: Insufficient documentation

## 2019-11-13 DIAGNOSIS — K219 Gastro-esophageal reflux disease without esophagitis: Secondary | ICD-10-CM | POA: Diagnosis not present

## 2019-11-13 MED ORDER — ESOMEPRAZOLE MAGNESIUM 40 MG PO CPDR: 40.0000 mg | DELAYED_RELEASE_CAPSULE | Freq: Every day | ORAL | 5 refills | Status: DC

## 2019-11-13 MED ORDER — TRULANCE 3 MG PO TABS: 3.0000 mg | ORAL_TABLET | Freq: Every day | ORAL | 5 refills | Status: DC

## 2019-11-13 MED ORDER — HYDROCORTISONE ACE-PRAMOXINE 1-1 % EX CREA: 1.0000 "application " | TOPICAL_CREAM | Freq: Two times a day (BID) | CUTANEOUS | 1 refills | Status: DC

## 2019-11-13 MED ORDER — FAMOTIDINE 20 MG PO TABS: 20.0000 mg | ORAL_TABLET | Freq: Every evening | ORAL | 3 refills | Status: DC | PRN

## 2019-11-13 NOTE — Progress Notes (Signed)
Presenting complaint;  Follow for right upper quadrant pain GERD constipation and hemorrhoids.  Database and subjective:  Patient is 54 year old Caucasian female with multiple GI issues who is here for scheduled visit.  She was last seen in April 2020.  She has history of sphincter of Oddi dysfunction with history of right upper quadrant abdominal pain with bump in her transaminases and she has undergone ERCP with sphincterotomy in the past including ERCP with balloon dilation of sphincteroplasty at [facility]. She had hemorrhoid surgery by Dr. Cheryll Cockayne in October 2016.  She states she is doing well as far as heartburn is concerned.  She feels esomeprazole is working.  She may have an episode once or twice every 14 days and that is usually triggered by food she should not be eating.  She has not had dysphagia nausea vomiting or regurgitation.  She states she eats her supper no later than 5 and goes to bed at 9 PM.  She has realized that if she eats later than that she does not do well at night.  She remains with intermittent right upper quadrant pain but she has not had a bad episode in several months.  She is using dicyclomine on as-needed basis. She says her bowels are moving regularly with medications.  She noted bump in anal canal with some bleeding recently.  She has been using Analpram-HC which has helped and she would like to get a prescription.  She has been on Linzess which has worked well but has not under her plan.  Last time she was had Dr. Woody Seller office she was able to get some samples. Patient says she has been using ibuprofen more often since she injured her left knee in October 2020.  She is not using meloxicam anymore.  She apparently had torn meniscus.  Current Medications: Outpatient Encounter Medications as of 11/13/2019  Medication Sig  . dicyclomine (BENTYL) 20 MG tablet TAKE ONE TABLET BY MOUTH FOUR TIMES DAILY BEFORE MEALS AND AT BEDTIME  . DULoxetine (CYMBALTA) 60 MG capsule  Take 1 capsule (60 mg total) by mouth daily.  Marland Kitchen esomeprazole (NEXIUM) 40 MG capsule Take 1 capsule (40 mg total) by mouth 2 (two) times daily.  Marland Kitchen estradiol (ESTRACE) 1 MG tablet TAKE ONE TABLET BY MOUTH EVERY DAY  . famotidine (PEPCID) 20 MG tablet Take 1 tablet (20 mg total) by mouth at bedtime.  . fexofenadine (ALLEGRA) 180 MG tablet Take 180 mg by mouth daily as needed for allergies or rhinitis.  . hydrOXYzine (ATARAX/VISTARIL) 25 MG tablet Take 1 tablet (25 mg) four times daily as needed: For anxiety  . ibuprofen (ADVIL,MOTRIN) 200 MG tablet Take by mouth.  Marland Kitchen LINZESS 290 MCG CAPS capsule TAKE ONE CAPSULE BY MOUTH DAILY  . meclizine (ANTIVERT) 25 MG tablet Take 25 mg by mouth 3 (three) times daily as needed for dizziness.  . meloxicam (MOBIC) 15 MG tablet Take 1 tablet by mouth once a day  TAKE 1 TAB BY MOUTH ONCE A DAY FOR 2 WEEKS, THEN ONLY AS NEEDED  . Multiple Vitamin (DAILY VITAMIN) tablet Take 1 tablet by mouth daily. For low Vitamin  . polyethylene glycol powder (GLYCOLAX/MIRALAX) powder TAKE 34 GRAMS (TWO CAPFULS) IN 8 OZ OF FLUID TWICE DAILY  . traZODone (DESYREL) 50 MG tablet TAKE ONE TABLET BY MOUTH AS NEEDED FOR SLEEP.  . [DISCONTINUED] cetirizine (ZYRTEC) 10 MG tablet Take 1 tablet (10 mg total) by mouth daily. For allergies (Patient not taking: Reported on 11/13/2019)   No  facility-administered encounter medications on file as of 11/13/2019.     Objective: Blood pressure 130/83, pulse 74, temperature (!) 97.4 F (36.3 C), temperature source Temporal, height 5\' 3"  (1.6 m), weight 170 lb 1.6 oz (77.2 kg), last menstrual period 05/27/2015.  Conjunctiva is pink. Sclera is nonicteric Oropharyngeal mucosa is normal. No neck masses or thyromegaly noted. Cardiac exam with regular rhythm normal S1 and S2. No murmur or gallop noted. Lungs are clear to auscultation. Abdomen is full.  Soft and nontender with organomegaly or masses. Rectal examination reveals soft anal tags.  Digital  examination was not performed. No LE edema or clubbing noted.  Labs/studies Results: Lab data provided by the patient From 10/18/2019 WBC 7.0 H&H 12.5 and 37.2 Platelet count 337K Glucose 88 BUN 12 and creatinine 0.92 Serum calcium 9.1 Bilirubin 0.3, AP 70, AST 19, ALT 15, total protein 7.2 and albumin 4.4. Fecal occult blood negative.  Vitamin D2  level 35.7  TSH 1.090   Assessment:  #1.  Chronic GERD.  Patient is doing well with single dose PPI and antireflux measures and as needed famotidine.  #2.  Right upper quadrant pain.  She has history of sphincter of Oddi dysfunction and has undergone multiple ERCPs in the past.  Last ERCP was about 8 years ago at [facility].  She has not had an episode of severe pain.  Her transaminases remain normal.  Some of her pain is felt to be due to IBS as she seemed to respond to dicyclomine.  #3.  Chronic constipation.  Linzess is not covered by her plan her co-pay is too high.  We will try her on Trulance/plecanatide 3 mg p.o. every morning.  #4.  Hemorrhoidal bleeding.  She has soft anal tags.    Plan:  Patient given samples of Linzess/linaclotide to 90 mcg p.o. every morning.  When she finishes samples she will start Trulance/plecanatide tied 3 mg by mouth every morning. New prescription given for esomeprazole 40 mg p.o. every morning famotidine 20 mg p.o. nightly as needed and Analpram-HC cream that she will apply twice daily for 1 to 2 weeks and thereafter on as-needed basis. Patient advised to keep NSAID use to minimum. Office visit in 1 year.

## 2019-11-13 NOTE — Patient Instructions (Signed)
Notify if esomeprazole stops working. Please call office if Trulance is not covered or if it does not work.

## 2019-12-28 ENCOUNTER — Other Ambulatory Visit (HOSPITAL_COMMUNITY): Payer: Self-pay | Admitting: Psychiatry

## 2020-01-30 ENCOUNTER — Ambulatory Visit (HOSPITAL_COMMUNITY): Payer: 59 | Admitting: Psychiatry

## 2020-02-05 ENCOUNTER — Other Ambulatory Visit: Payer: Self-pay

## 2020-02-05 ENCOUNTER — Encounter (HOSPITAL_COMMUNITY): Payer: Self-pay | Admitting: Psychiatry

## 2020-02-05 ENCOUNTER — Telehealth (INDEPENDENT_AMBULATORY_CARE_PROVIDER_SITE_OTHER): Payer: 59 | Admitting: Psychiatry

## 2020-02-05 DIAGNOSIS — F332 Major depressive disorder, recurrent severe without psychotic features: Secondary | ICD-10-CM | POA: Diagnosis not present

## 2020-02-05 MED ORDER — DULOXETINE HCL 30 MG PO CPEP: 30.0000 mg | ORAL_CAPSULE | Freq: Every day | ORAL | 3 refills | Status: DC

## 2020-02-05 MED ORDER — TRAZODONE HCL 50 MG PO TABS: ORAL_TABLET | ORAL | 3 refills | Status: DC

## 2020-02-05 MED ORDER — HYDROXYZINE HCL 25 MG PO TABS: ORAL_TABLET | ORAL | 3 refills | Status: DC
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
## 2020-05-09 ENCOUNTER — Other Ambulatory Visit (INDEPENDENT_AMBULATORY_CARE_PROVIDER_SITE_OTHER): Payer: Self-pay | Admitting: Internal Medicine

## 2020-05-09 DIAGNOSIS — K219 Gastro-esophageal reflux disease without esophagitis: Secondary | ICD-10-CM

## 2020-05-22 ENCOUNTER — Other Ambulatory Visit (HOSPITAL_COMMUNITY): Payer: Self-pay | Admitting: Psychiatry

## 2020-05-26 NOTE — Telephone Encounter (Signed)
Call for appt

## 2020-06-02 ENCOUNTER — Telehealth (INDEPENDENT_AMBULATORY_CARE_PROVIDER_SITE_OTHER): Payer: 59 | Admitting: Psychiatry

## 2020-06-02 ENCOUNTER — Encounter (HOSPITAL_COMMUNITY): Payer: Self-pay | Admitting: Psychiatry

## 2020-06-02 ENCOUNTER — Other Ambulatory Visit: Payer: Self-pay

## 2020-06-02 DIAGNOSIS — F332 Major depressive disorder, recurrent severe without psychotic features: Secondary | ICD-10-CM | POA: Diagnosis not present

## 2020-06-02 MED ORDER — HYDROXYZINE HCL 25 MG PO TABS: 25.0000 mg | ORAL_TABLET | Freq: Three times a day (TID) | ORAL | 3 refills | Status: DC | PRN

## 2020-06-02 MED ORDER — TRAZODONE HCL 50 MG PO TABS: ORAL_TABLET | ORAL | 3 refills | Status: DC

## 2020-06-02 MED ORDER — DULOXETINE HCL 20 MG PO CPEP
20.0000 mg | ORAL_CAPSULE | Freq: Every day | ORAL | 3 refills | Status: DC
Start: 2020-06-02 — End: 2020-09-02
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
## 2020-08-18 ENCOUNTER — Telehealth (HOSPITAL_COMMUNITY): Payer: Self-pay | Admitting: Psychiatry

## 2020-08-18 NOTE — Telephone Encounter (Signed)
Called pt to schedule f/u appt, pt answered and advised she will call back when she arrives home

## 2020-09-02 ENCOUNTER — Other Ambulatory Visit (HOSPITAL_COMMUNITY): Payer: Self-pay | Admitting: Psychiatry

## 2020-10-14 ENCOUNTER — Other Ambulatory Visit: Payer: Self-pay

## 2020-10-14 ENCOUNTER — Telehealth (INDEPENDENT_AMBULATORY_CARE_PROVIDER_SITE_OTHER): Payer: 59 | Admitting: Psychiatry

## 2020-10-14 ENCOUNTER — Encounter (HOSPITAL_COMMUNITY): Payer: Self-pay | Admitting: Psychiatry

## 2020-10-14 DIAGNOSIS — F332 Major depressive disorder, recurrent severe without psychotic features: Secondary | ICD-10-CM

## 2020-10-14 MED ORDER — DULOXETINE HCL 20 MG PO CPEP: 20.0000 mg | ORAL_CAPSULE | Freq: Every day | ORAL | 3 refills | Status: DC

## 2020-10-14 MED ORDER — TRAZODONE HCL 50 MG PO TABS: ORAL_TABLET | ORAL | 3 refills | Status: DC

## 2020-10-14 MED ORDER — HYDROXYZINE HCL 25 MG PO TABS: 25.0000 mg | ORAL_TABLET | Freq: Three times a day (TID) | ORAL | 3 refills | Status: DC | PRN
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
## 2020-10-21 ENCOUNTER — Other Ambulatory Visit (HOSPITAL_COMMUNITY): Payer: Self-pay | Admitting: Internal Medicine

## 2020-10-21 DIAGNOSIS — Z1231 Encounter for screening mammogram for malignant neoplasm of breast: Secondary | ICD-10-CM

## 2020-11-10 ENCOUNTER — Other Ambulatory Visit (INDEPENDENT_AMBULATORY_CARE_PROVIDER_SITE_OTHER): Payer: Self-pay

## 2020-11-10 DIAGNOSIS — K219 Gastro-esophageal reflux disease without esophagitis: Secondary | ICD-10-CM

## 2020-11-10 MED ORDER — ESOMEPRAZOLE MAGNESIUM 40 MG PO CPDR: DELAYED_RELEASE_CAPSULE | ORAL | 5 refills | Status: DC

## 2020-11-14 ENCOUNTER — Ambulatory Visit (HOSPITAL_COMMUNITY): Payer: Self-pay

## 2020-11-18 ENCOUNTER — Encounter (INDEPENDENT_AMBULATORY_CARE_PROVIDER_SITE_OTHER): Payer: Self-pay | Admitting: Internal Medicine

## 2020-11-18 ENCOUNTER — Telehealth (INDEPENDENT_AMBULATORY_CARE_PROVIDER_SITE_OTHER): Payer: Self-pay | Admitting: Internal Medicine

## 2020-11-18 ENCOUNTER — Other Ambulatory Visit: Payer: Self-pay

## 2020-11-18 DIAGNOSIS — K219 Gastro-esophageal reflux disease without esophagitis: Secondary | ICD-10-CM

## 2020-11-18 DIAGNOSIS — R1011 Right upper quadrant pain: Secondary | ICD-10-CM

## 2020-11-18 DIAGNOSIS — K5900 Constipation, unspecified: Secondary | ICD-10-CM

## 2020-11-18 MED ORDER — DICYCLOMINE HCL 20 MG PO TABS: 20.0000 mg | ORAL_TABLET | Freq: Three times a day (TID) | ORAL | 2 refills | Status: AC | PRN

## 2020-11-18 NOTE — Progress Notes (Signed)
Virtual Visit via Telephone Note  I connected with Marie Hurley on 11/18/20 at  1:54 PM EST by telephone and verified that I am speaking with the correct person using two identifiers.  Location: Patient: home Provider: office   I discussed the limitations, risks, security and privacy concerns of performing an evaluation and management service by telephone and the availability of in person appointments. I also discussed with the patient that there may be a patient responsible charge related to this service. The patient expressed understanding and agreed to proceed.   History of Present Illness:  Last visit 11/13/2019. 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 Patient says today she is feeling better.  Last week she was diagnosed with Covid.  She had bad cold lasting for 24 hours.  She says her son and grandchildren all had Covid.  She has received 3 doses of Covid vaccine per CDC guidelines. She says she has heartburn no more than once a week and Pepcid helps.  She also takes Pepcid on days when she eats certain food she is afraid might cause breakthrough symptoms.  She takes Pepcid no more than 2-3 times in a week.  She is having to take 2 capfuls of polyethylene glycol.  Her stools are usually loose.  She has urgency and she has had few accidents.  She may have 2 or 3 episodes of rectal bleeding with her bowel movements per month.  She is up-to-date on CRC screening.  Last colonoscopy was in April 2015 revealing small external hemorrhoids anal papillae and skin tags.  She is status post hemorrhoidectomy by Dr. Cheryll Cockayne of [facility] in December 2016. She remains with right upper quadrant abdominal pain.  She has an average of 2 to 3/month.  She feels abdominal pain  triggers headache and vice versa as well.  She has not had a spell that she would describe intractable.  She feels dicyclomine helps.  These spells are not associated with nausea or vomiting but she does wake up with nausea some mornings particularly if her bowels have not moved.  Her appetite is good.  She has gained 5 pounds since her last visit.   Current Outpatient Medications:  .  DULoxetine (CYMBALTA) 20 MG capsule, Take 1 capsule (20 mg total) by mouth daily., Disp: 30 capsule, Rfl: 3 .  esomeprazole (NEXIUM) 40 MG capsule, TAKE ONE CAPSULE BY MOUTH DAILY BEFORE BREAKFAST., Disp: 30 capsule, Rfl: 5 .  estradiol (ESTRACE) 1 MG tablet, TAKE ONE TABLET BY MOUTH EVERY DAY, Disp: 90 tablet, Rfl: 3 .  famotidine (PEPCID) 20 MG tablet, Take 1 tablet (20 mg total) by mouth at bedtime as needed for heartburn or indigestion., Disp: 90 tablet, Rfl: 3 .  fexofenadine (ALLEGRA) 180 MG tablet, Take 180 mg by mouth daily as needed for allergies or rhinitis., Disp: , Rfl:  .  hydrOXYzine (ATARAX/VISTARIL) 25 MG tablet, Take 1 tablet (25 mg total) by mouth 3 (three) times daily as needed for anxiety., Disp: 90 tablet, Rfl: 3 .  ibuprofen (ADVIL,MOTRIN) 200 MG tablet, Take 400 mg by mouth 2 (two) times daily as needed. , Disp: , Rfl:  .  meclizine (ANTIVERT) 25 MG tablet, Take 25 mg by mouth 3 (three) times daily as needed for dizziness., Disp: , Rfl:  .  Multiple Vitamin (DAILY VITAMIN) tablet, Take 1 tablet by mouth daily. For low Vitamin, Disp: , Rfl:  .  polyethylene glycol powder (GLYCOLAX/MIRALAX) powder, TAKE 34 GRAMS (TWO CAPFULS) IN 8 OZ OF FLUID TWICE DAILY, Disp: , Rfl: 5 .  pramoxine-hydrocortisone (PROCTOCREAM-HC) 1-1 % rectal cream, Place 1 application rectally 2 (two) times daily., Disp: 30 g, Rfl: 1 .  Red Yeast Rice Extract 600 MG CAPS, Take 600 mg by mouth in the morning and at bedtime., Disp: , Rfl:  .  traZODone (DESYREL) 50 MG tablet, TAKE ONE TABLET BY MOUTH AS NEEDED FOR SLEEP., Disp: 30  tablet, Rfl: 3 .  dicyclomine (BENTYL) 20 MG tablet, Take 1 tablet (20 mg total) by mouth 3 (three) times daily as needed for spasms., Disp: 90 tablet, Rfl: 2     Observations/Objective:  Patient reported her weight to be 175 pounds. She weighed 170 pounds on 11/13/2019.  Assessment and Plan:  #1 chronic GERD.  She appears to be doing well with 40 mg of esomeprazole every morning and 20 mg of famotidine in the evening on as-needed basis.  #2.  Chronic constipation.  She will continue polyethylene glycol as long as it is working.  Since she is having loose stools and accidents she may consider backing off on the dose and add fiber supplement.  #3.  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  She is status post ERCP twice in the past with sphincterotomy on first intervention and sphincteroplasty on second intervention.  She has not had bump in her transaminases with these episodes.  Therefore we will continue to treat her symptomatically with antispasmodic.  Plan  Prescription for dicyclomine sent to patient's pharmacy. Consider adding 3 to 4 g of fiber supplement daily if tolerated. Office visit in 1 year. Next screening colonoscopy in April 2025.  Follow Up Instructions:  Patient will call if she has another episode of moderate to severe right upper quadrant abdominal pain if in case we will do LFTs.  I discussed the assessment and treatment plan with the patient. The patient was provided an opportunity to ask questions and all were answered. The patient agreed with the plan and demonstrated an understanding of the instructions.   The patient was advised to call back or seek an in-person evaluation if the symptoms worsen or if the condition fails to improve as anticipated.  I provided 20 minutes of non-face-to-face time during this encounter.   Hildred Laser, MD

## 2020-11-21 ENCOUNTER — Telehealth (HOSPITAL_COMMUNITY): Payer: Self-pay | Admitting: Psychiatry

## 2020-11-21 NOTE — Telephone Encounter (Signed)
Called to schedule f/u appt, left vm 

## 2020-12-03 ENCOUNTER — Ambulatory Visit (HOSPITAL_COMMUNITY): Payer: Self-pay

## 2020-12-05 ENCOUNTER — Other Ambulatory Visit (INDEPENDENT_AMBULATORY_CARE_PROVIDER_SITE_OTHER): Payer: Self-pay | Admitting: Internal Medicine

## 2020-12-05 ENCOUNTER — Ambulatory Visit (HOSPITAL_COMMUNITY): Payer: Self-pay

## 2020-12-10 ENCOUNTER — Encounter (INDEPENDENT_AMBULATORY_CARE_PROVIDER_SITE_OTHER): Payer: Self-pay

## 2020-12-18 ENCOUNTER — Inpatient Hospital Stay (HOSPITAL_COMMUNITY): Admission: RE | Admit: 2020-12-18 | Payer: Self-pay | Source: Ambulatory Visit

## 2021-02-04 ENCOUNTER — Other Ambulatory Visit (HOSPITAL_COMMUNITY): Payer: Self-pay | Admitting: Psychiatry

## 2021-03-24 ENCOUNTER — Telehealth (HOSPITAL_COMMUNITY): Payer: Self-pay | Admitting: *Deleted

## 2021-03-24 NOTE — Telephone Encounter (Signed)
Patient is needing refills for all of her medications provider prescribes for her. Patient appt was cancelled due to provider being on medical leave. Message was left to call office to resch appt.  

## 2021-04-14 ENCOUNTER — Telehealth (HOSPITAL_COMMUNITY): Payer: 59 | Admitting: Psychiatry

## 2021-05-08 ENCOUNTER — Other Ambulatory Visit (INDEPENDENT_AMBULATORY_CARE_PROVIDER_SITE_OTHER): Payer: Self-pay | Admitting: Internal Medicine

## 2021-05-08 DIAGNOSIS — K219 Gastro-esophageal reflux disease without esophagitis: Secondary | ICD-10-CM

## 2021-05-11 ENCOUNTER — Encounter (HOSPITAL_COMMUNITY): Payer: Self-pay | Admitting: Psychiatry

## 2021-05-11 ENCOUNTER — Telehealth (INDEPENDENT_AMBULATORY_CARE_PROVIDER_SITE_OTHER): Payer: 59 | Admitting: Psychiatry

## 2021-05-11 ENCOUNTER — Other Ambulatory Visit: Payer: Self-pay

## 2021-05-11 DIAGNOSIS — F332 Major depressive disorder, recurrent severe without psychotic features: Secondary | ICD-10-CM

## 2021-05-11 MED ORDER — DULOXETINE HCL 20 MG PO CPEP: 20.0000 mg | ORAL_CAPSULE | Freq: Every day | ORAL | 3 refills | Status: DC

## 2021-05-11 MED ORDER — HYDROXYZINE HCL 25 MG PO TABS: 25.0000 mg | ORAL_TABLET | Freq: Three times a day (TID) | ORAL | 3 refills | Status: DC | PRN
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
## 2021-05-12 ENCOUNTER — Telehealth (HOSPITAL_COMMUNITY): Payer: Self-pay | Admitting: Psychiatry

## 2021-05-12 NOTE — Telephone Encounter (Signed)
Called to schedule f/u appt unable to leave vm  °

## 2021-07-16 ENCOUNTER — Other Ambulatory Visit (INDEPENDENT_AMBULATORY_CARE_PROVIDER_SITE_OTHER): Payer: Self-pay | Admitting: Internal Medicine

## 2021-07-16 NOTE — Telephone Encounter (Signed)
Last ov 11/18/20

## 2021-07-21 ENCOUNTER — Telehealth (INDEPENDENT_AMBULATORY_CARE_PROVIDER_SITE_OTHER): Payer: Self-pay

## 2021-07-21 NOTE — Telephone Encounter (Signed)
Amy from [facility] called and says they are unable to find the 1% Pramoxine Rectal cream on this patient would it be ok to change it to the 2.5 % /1 % pramoxine? Please advise. Thanks.

## 2021-07-22 NOTE — Telephone Encounter (Signed)
Amy from [facility] called in today to follow up on this. Please advise.

## 2021-07-23 NOTE — Telephone Encounter (Signed)
Spoke with Jarrett Soho at [facility] she is aware of all.

## 2021-07-24 ENCOUNTER — Telehealth (INDEPENDENT_AMBULATORY_CARE_PROVIDER_SITE_OTHER): Payer: Self-pay | Admitting: *Deleted

## 2021-07-24 NOTE — Telephone Encounter (Signed)
Marie Hurley from [facility] states they originally called to have protocream changed from 2.5 and it was sent in with change. Now stating it is not FDA approved and would like alternative.   [facility] [phone]

## 2021-07-28 ENCOUNTER — Other Ambulatory Visit (INDEPENDENT_AMBULATORY_CARE_PROVIDER_SITE_OTHER): Payer: Self-pay | Admitting: Gastroenterology

## 2021-07-28 MED ORDER — HYDROCORTISONE (PERIANAL) 2.5 % EX CREA: 1.0000 "application " | TOPICAL_CREAM | Freq: Two times a day (BID) | CUTANEOUS | 2 refills | Status: DC

## 2021-07-28 NOTE — Telephone Encounter (Signed)
Left message to return call 

## 2021-07-29 NOTE — Telephone Encounter (Signed)
Patient aware and states she has picked it up.  

## 2021-10-16 ENCOUNTER — Other Ambulatory Visit (HOSPITAL_COMMUNITY): Payer: Self-pay | Admitting: Psychiatry

## 2021-10-19 ENCOUNTER — Ambulatory Visit (HOSPITAL_COMMUNITY): Payer: Self-pay

## 2021-10-21 ENCOUNTER — Other Ambulatory Visit: Payer: Self-pay

## 2021-10-21 ENCOUNTER — Ambulatory Visit (HOSPITAL_COMMUNITY)
Admission: RE | Admit: 2021-10-21 | Discharge: 2021-10-21 | Disposition: A | Discharge status: Home / Self Care | Payer: BLUE CROSS/BLUE SHIELD | Source: Ambulatory Visit | Attending: Internal Medicine | Admitting: Internal Medicine

## 2021-10-21 DIAGNOSIS — Z1231 Encounter for screening mammogram for malignant neoplasm of breast: Secondary | ICD-10-CM | POA: Insufficient documentation

## 2021-10-31 ENCOUNTER — Other Ambulatory Visit (INDEPENDENT_AMBULATORY_CARE_PROVIDER_SITE_OTHER): Payer: Self-pay | Admitting: Internal Medicine

## 2021-10-31 ENCOUNTER — Other Ambulatory Visit (HOSPITAL_COMMUNITY): Payer: Self-pay | Admitting: Psychiatry

## 2021-10-31 DIAGNOSIS — K219 Gastro-esophageal reflux disease without esophagitis: Secondary | ICD-10-CM

## 2021-11-02 NOTE — Telephone Encounter (Signed)
Call for appt

## 2021-11-06 ENCOUNTER — Other Ambulatory Visit: Payer: Self-pay

## 2021-11-06 ENCOUNTER — Encounter (HOSPITAL_COMMUNITY): Payer: Self-pay

## 2021-11-06 ENCOUNTER — Encounter (HOSPITAL_COMMUNITY): Payer: Self-pay | Admitting: Psychiatry

## 2021-11-06 ENCOUNTER — Telehealth (INDEPENDENT_AMBULATORY_CARE_PROVIDER_SITE_OTHER): Payer: 59 | Admitting: Psychiatry

## 2021-11-06 DIAGNOSIS — F332 Major depressive disorder, recurrent severe without psychotic features: Secondary | ICD-10-CM

## 2021-11-06 MED ORDER — DULOXETINE HCL 30 MG PO CPEP: 30.0000 mg | ORAL_CAPSULE | Freq: Every day | ORAL | 3 refills | Status: DC

## 2021-11-06 MED ORDER — HYDROXYZINE HCL 50 MG PO TABS: 50.0000 mg | ORAL_TABLET | Freq: Three times a day (TID) | ORAL | 2 refills | Status: DC | PRN

## 2021-11-06 NOTE — Progress Notes (Signed)
Virtual Visit via Telephone Note  I connected with Marie Hurley on 11/06/21 at 10:20 AM EST by telephone and verified that I am speaking with the correct person using two identifiers.  Location: Patient: home Provider: office   I discussed the limitations, risks, security and privacy concerns of performing an evaluation and management service by telephone and the availability of in person appointments. I also discussed with the patient that there may be a patient responsible charge related to this service. The patient expressed understanding and agreed to proceed.      I discussed the assessment and treatment plan with the patient. The patient was provided an opportunity to ask questions and all were answered. The patient agreed with the plan and demonstrated an understanding of the instructions.   The patient was advised to call back or seek an in-person evaluation if the symptoms worsen or if the condition fails to improve as anticipated.  I provided 20 minutes of non-face-to-face time during this encounter.   Levonne Spiller, MD  [facility] MD/PA/NP OP Progress Note  11/06/2021 10:47 AM Marie Hurley  MRN:  [id number]  Chief Complaint:  Chief Complaint   Depression; Anxiety; Follow-up    HPI: This patient is a 56 year old married white female who lives with her husband in [place]. She is on disability for migraine headaches and irritable bowel syndrome. 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  She states overall she is doing fairly well.  Her husband is taking a new job as an Clinical biochemist for a pre[employer]al factory.  He has had to travel a lot for trainings.  She states that she gets more anxious when he is gone and especially at night.  I offered to increase her hydroxyzine and she thinks this is a good idea at least temporarily.  Her mood has been stable but she is continuing to have some  menopausal symptoms even on the estrogen.  She thinks that her gynecologist may stop it soon and she wonders if we can go up on the Cymbalta to help with some of the symptoms and I think this is reasonable.  Her energy interests activity level are all good and she denies suicidal ideation Visit Diagnosis:    ICD-10-CM   1. Severe episode of recurrent major depressive disorder, without psychotic features ([place])  F33.2       Past Psychiatric History: Admission in May 2017 for angry agitated behavior in the context of substance abuse  Past Medical History:  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 %   Migraine    PONV (postoperative nausea and vomiting)    Seasonal allergies    Severe major depressive disorder ([place])    Suicidal behavior     Past Surgical History:  Procedure Laterality Date   ABDOMINAL HYSTERECTOMY     CARPAL TUNNEL RELEASE     rt   CHOLECYSTECTOMY  2002  lap choli   COLONOSCOPY     COLONOSCOPY WITH PROPOFOL N/A 02/07/2014   Procedure: COLONOSCOPY WITH PROPOFOL;  Surgeon: Rogene Houston, MD;  [facility];  Service: Endoscopy;  Laterality: N/A;  entered cecum @ 0756 ; total cecal withdrawal time- 9 min   CYSTOCELE REPAIR N/A 06/10/2015   Procedure: ANTERIOR REPAIR (CYSTOCELE);  Surgeon: [name], MD;  Location: [facility];  Service: Gynecology;  Laterality: N/A;   DIAGNOSTIC LAPAROSCOPY  2007   exploratory-nic bowel duct   DILATION AND CURETTAGE OF UTERUS  '91 & '96   ERCP  2005,2007,2013   several   HEMORROIDECTOMY     RECTOCELE REPAIR N/A 06/10/2015   Procedure: POSTERIOR REPAIR  (RECTOCELE);  Surgeon: [name], MD;  Location: [facility];  Service: Gynecology;  Laterality: N/A;   SHOULDER ARTHROSCOPY WITH SUBACROMIAL DECOMPRESSION, ROTATOR CUFF REPAIR AND BICEP TENDON REPAIR Right 05/24/2014   Procedure: RIGHT SHOULDER ARTHROSCOPY DEBRIDEMENT LIMITED, DECOMPRESSION SUBACROMIAL PARTIAL ACROMIOPLASTY WITH ROTATOR CUFF REPAIR;  Surgeon: Johnny Bridge, MD;  Location: [facility];  Service: Orthopedics;  Laterality: Right;   TUBAL LIGATION     VAGINAL HYSTERECTOMY N/A 06/10/2015   Procedure: HYSTERECTOMY VAGINAL;  Surgeon: [name], MD;  [facility];  Service: Gynecology;  Laterality: N/A;    Family Psychiatric History: see below  Family History:  Family History  Problem Relation Age of Onset   Hypertension Mother    Heart disease Mother    COPD Mother    Depression Mother    Anxiety disorder Mother    Heart disease Father    Diabetes Father    Hypertension Father    Anxiety disorder Father    Depression Father    Heart disease Sister    Depression Sister    Anxiety disorder Sister    Healthy Son    Healthy Daughter    Depression Sister    Anxiety disorder Sister    Depression Cousin    Depression Cousin    Depression Cousin     Social History:  Social History   Socioeconomic History   Marital status: Married    Spouse name: Not on file   Number of children: Not on file   Years of education: Not on file   Highest education level: Not on file  Occupational History   Not on file  Tobacco Use   Smoking status: Never   Smokeless tobacco: Never  Substance and Sexual Activity   Alcohol use: No    Alcohol/week: 1.0 standard drink    Types: 1 Standard drinks or equivalent per week    Comment: rare 1-2 drinks per month, 04-27-16 per pt no and stopped 2 months ago   Drug use: No    Comment: 04-27-16 per pt no   Sexual activity: Yes    Partners: Male    Birth control/protection: Surgical    Comment: hyst  Other Topics  Concern   Not on file  Social History Narrative   Not on file   Social Determinants of Health   Financial Resource Strain: Not on file  Food Insecurity: Not on file  Transportation Needs: Not on file  Physical Activity: Not on file  Stress: Not on file  Social Connections: Not on file    Allergies:  Allergies  Allergen Reactions   Penicillins Hives    Received 2GM Ancef preop with no reaction.     Promethazine Hcl Nausea And Vomiting    Metabolic Disorder Labs:  No results found for: HGBA1C, MPG No results found for: PROLACTIN Lab Results  Component Value Date   CHOL 231 (H) 07/22/2017   TRIG 76 07/22/2017   HDL 87 07/22/2017   CHOLHDL 2.7 07/22/2017   LDLCALC 129 (H) 07/22/2017   Lab Results  Component Value Date   TSH 1.060 07/22/2017    Therapeutic Level Labs: No results found for: LITHIUM No results found for: VALPROATE No components found for:  CBMZ  Current Medications: Current Outpatient Medications  Medication Sig Dispense Refill   hydrOXYzine (ATARAX) 50 MG tablet Take 1 tablet (50 mg total) by mouth 3 (three) times daily as needed. 90 tablet 2   dicyclomine (BENTYL) 20 MG tablet Take 1 tablet (20 mg total) by mouth 3 (three) times daily as needed for spasms. 90 tablet 2   DULoxetine (CYMBALTA) 30 MG capsule Take 1 capsule (30 mg total) by mouth daily. 90 capsule 3   esomeprazole (NEXIUM) 40 MG capsule TAKE ONE CAPSULE BY MOUTH DAILY BEFORE BREAKFAST. 30 capsule 5   estradiol (ESTRACE) 1 MG tablet TAKE ONE TABLET BY MOUTH EVERY DAY 90 tablet 3   famotidine (PEPCID) 20 MG tablet TAKE ONE TABLET BY MOUTH AT BEDTIME AS NEEDED FOR HEARTBURN OR INDIGESTION. 90 tablet 3   fexofenadine (ALLEGRA) 180 MG tablet Take 180 mg by mouth daily as needed for allergies or rhinitis.     hydrocortisone (ANUSOL-HC) 2.5 % rectal cream Place 1 application rectally 2 (two) times daily. 28 g 2   ibuprofen (ADVIL,MOTRIN) 200 MG tablet Take 400 mg by mouth 2 (two) times daily as  needed.      meclizine (ANTIVERT) 25 MG tablet Take 25 mg by mouth 3 (three) times daily as needed for dizziness.     Multiple Vitamin (DAILY VITAMIN) tablet Take 1 tablet by mouth daily. For low Vitamin     polyethylene glycol powder (GLYCOLAX/MIRALAX) powder TAKE 34 GRAMS (TWO CAPFULS) IN 8 OZ OF FLUID TWICE DAILY  5   Red Yeast Rice Extract 600 MG CAPS Take 600 mg by mouth in the morning and at bedtime.     No current facility-administered medications for this visit.     Musculoskeletal: Strength & Muscle Tone: na Gait & Station: na Patient leans: N/A  Psychiatric Specialty Exam: Review of Systems  Psychiatric/Behavioral:  The patient is nervous/anxious.   All other systems reviewed and are negative.  Last menstrual period 05/27/2015.There is no height or weight on file to calculate BMI.  General Appearance: NA  Eye Contact:  NA  Speech:  Clear and Coherent  Volume:  Normal  Mood:  Euthymic  Affect:  NA  Thought Process:  Goal Directed  Orientation:  Full (Time, Place, and Person)  Thought Content: Rumination   Suicidal Thoughts:  No  Homicidal Thoughts:  No  Memory:  Immediate;   Good Recent;   Good Remote;   Fair  Judgement:  Good  Insight:  Good  Psychomotor Activity:  Normal  Concentration:  Concentration: Good and Attention Span: Good  Recall:  Good  Fund of Knowledge: Good  Language: Good  Akathisia:  No  Handed:  Right  AIMS (if indicated): not done  Assets:  Communication Skills Desire for Improvement Physical Health Resilience Social Support Talents/Skills  ADL's:  Intact  Cognition: WNL  Sleep:  Fair   Screenings: AIMS    Flowsheet Row Admission (Discharged) from 03/02/2016 in [facility]  AIMS Total Score 0  MDI    Flowsheet Row Office Visit from 04/27/2016 in [facility]  Total Score (max 50) 27      PHQ2-9    Flowsheet Row Video Visit from 11/06/2021 in  [facility] Video Visit from 05/11/2021 in [place] Office Visit from 07/22/2017 in [facility]  PHQ-2 Total Score 0 0 2  PHQ-9 Total Score -- -- 6      Flowsheet Row Video Visit from 11/06/2021 in [facility] Video Visit from 05/11/2021 in [facility]  C-SSRS RISK CATEGORY No Risk No Risk        Assessment and Plan: Is a 56 year old female with a history of depression anxiety and a remote history of substance abuse.  She is doing fairly well but would like to go a little bit higher on the Cymbalta because of menopausal symptoms so we will increase to 30 mg daily.  She is more anxious while her husband is away so we will increase hydroxyzine to 50 mg up to 3 times daily as needed for anxiety.  She will return to see me in 6 months or call sooner as needed   Levonne Spiller, MD 11/06/2021, 10:47 AM

## 2021-11-10 ENCOUNTER — Other Ambulatory Visit (INDEPENDENT_AMBULATORY_CARE_PROVIDER_SITE_OTHER): Payer: Self-pay | Admitting: Internal Medicine

## 2021-11-13 ENCOUNTER — Other Ambulatory Visit: Payer: Self-pay

## 2021-11-13 ENCOUNTER — Ambulatory Visit (INDEPENDENT_AMBULATORY_CARE_PROVIDER_SITE_OTHER): Payer: BLUE CROSS/BLUE SHIELD | Admitting: Obstetrics & Gynecology

## 2021-11-13 ENCOUNTER — Encounter: Payer: Self-pay | Admitting: Obstetrics & Gynecology

## 2021-11-13 VITALS — BP 124/73 | HR 78 | Ht 63.75 in | Wt 177.0 lb

## 2021-11-13 DIAGNOSIS — Z79890 Hormone replacement therapy (postmenopausal): Secondary | ICD-10-CM

## 2021-11-13 DIAGNOSIS — Z1211 Encounter for screening for malignant neoplasm of colon: Secondary | ICD-10-CM

## 2021-11-13 DIAGNOSIS — Z5181 Encounter for therapeutic drug level monitoring: Secondary | ICD-10-CM | POA: Diagnosis not present

## 2021-11-13 DIAGNOSIS — Z1212 Encounter for screening for malignant neoplasm of rectum: Secondary | ICD-10-CM

## 2021-11-13 DIAGNOSIS — Z01419 Encounter for gynecological examination (general) (routine) without abnormal findings: Secondary | ICD-10-CM | POA: Diagnosis not present

## 2021-11-13 MED ORDER — ESTRADIOL 1 MG PO TABS: 1.0000 mg | ORAL_TABLET | Freq: Every day | ORAL | 3 refills | Status: DC

## 2021-11-13 NOTE — Progress Notes (Signed)
Subjective:     Marie Hurley is a 56 y.o. female here for a routine exam.  Patient's last menstrual period was 05/27/2015. [id number] Birth Control Method:  hysterectomy Menstrual Calendar(currently): amenorrhea  Current complaints: none.   Current acute medical issues:  IBS   Recent Gynecologic History Patient's last menstrual period was 05/27/2015. Last Pap: n/a,   Last mammogram: 10/21/21,  normal  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 %   Migraine    PONV (postoperative nausea and vomiting)    Seasonal allergies    Severe major depressive disorder ([place])    Suicidal behavior     Past Surgical History:  Procedure Laterality Date   ABDOMINAL HYSTERECTOMY     CARPAL TUNNEL RELEASE     rt   CHOLECYSTECTOMY  2002   lap choli   COLONOSCOPY     COLONOSCOPY WITH PROPOFOL N/A 02/07/2014   Procedure: COLONOSCOPY WITH PROPOFOL;  Surgeon: Rogene Houston, MD;  Location: [facility];  Service: Endoscopy;  Laterality: N/A;  entered cecum @ 0756 ; total cecal withdrawal time- 9 min   CYSTOCELE REPAIR N/A 06/10/2015   Procedure: ANTERIOR REPAIR (CYSTOCELE);  Surgeon: [name], MD;  Location: [facility];  Service: Gynecology;  Laterality: N/A;   DIAGNOSTIC LAPAROSCOPY  2007   exploratory-nic bowel duct   DILATION AND CURETTAGE OF UTERUS  '91 & '96   ERCP  2005,2007,2013   several   HEMORROIDECTOMY     RECTOCELE REPAIR N/A 06/10/2015   Procedure: POSTERIOR REPAIR (RECTOCELE);  Surgeon: [name], MD;  Location: [facility];  Service: Gynecology;  Laterality: N/A;   SHOULDER ARTHROSCOPY WITH SUBACROMIAL  DECOMPRESSION, ROTATOR CUFF REPAIR AND BICEP TENDON REPAIR Right 05/24/2014   Procedure: RIGHT SHOULDER ARTHROSCOPY DEBRIDEMENT LIMITED, DECOMPRESSION SUBACROMIAL PARTIAL ACROMIOPLASTY WITH ROTATOR CUFF REPAIR;  Surgeon: Johnny Bridge, MD;  Location: [facility];  Service: Orthopedics;  Laterality: Right;   TUBAL LIGATION     VAGINAL HYSTERECTOMY N/A 06/10/2015   Procedure: HYSTERECTOMY VAGINAL;  Surgeon: [name], MD;  [facility]: [facility];  Service: Gynecology;  Laterality: N/A;    OB History     Gravida  3   Para  2   Term  2   Preterm      AB  1   Living  2      SAB  1   IAB      Ectopic      Multiple      Live Births  2           Social History   Socioeconomic History   Marital status: Married    Spouse name: Not on file   Number of children: Not on file   Years of education: Not on file   Highest education level: Not on file  Occupational History   Not on file  Tobacco Use   Smoking status: Never   Smokeless tobacco: Never  Vaping Use   Vaping Use: Never used  Substance and Sexual Activity   Alcohol use: No    Alcohol/week: 1.0 standard drink    Types: 1 Standard drinks or equivalent per week    Comment: rare 1-2 drinks per month, 04-27-16 per pt no and stopped 2 months ago   Drug use: No    Comment: 04-27-16 per pt no   Sexual activity: Yes    Partners: Male    Birth control/protection: Surgical    Comment: hyst  Other Topics Concern   Not on file  Social History Narrative   Not on file   Social Determinants of Health   Financial Resource Strain: Low Risk    Difficulty of Paying Living Expenses: Not very hard  Food Insecurity: No Food Insecurity   Worried About Charity fundraiser in the Last Year: Never true   Ran Out of Food in the Last Year: Never true  Transportation Needs: No Transportation Needs   Lack of Transportation (Medical): No   Lack of Transportation (Non-Medical): No  Physical Activity: Inactive    Days of Exercise per Week: 0 days   Minutes of Exercise per Session: 0 min  Stress: No Stress Concern Present   Feeling of Stress : Only a little  Social Connections: Moderately Integrated   Frequency of Communication with Friends and Family: More than three times a week   Frequency of Social Gatherings with Friends and Family: Twice a week   Attends Religious Services: More than 4 times per year   Active Member of [employer] or Organizations: No   Attends Music therapist: Never   Marital Status: Married    Family History  Problem Relation Age of Onset   Hypertension Mother    Heart disease Mother    COPD Mother    Depression Mother    Anxiety disorder Mother    Heart disease Father    Diabetes Father    Hypertension Father    Anxiety disorder Father    Depression Father    Heart disease Sister    Depression Sister    Anxiety disorder Sister    Healthy Son    Healthy Daughter    Depression Sister    Anxiety disorder Sister    Depression Cousin    Depression Cousin    Depression Cousin      Current Outpatient Medications:    dicyclomine (BENTYL) 20 MG tablet, Take 1 tablet (20 mg total) by mouth 3 (three) times daily as needed for spasms., Disp: 90 tablet, Rfl: 2   DULoxetine (CYMBALTA) 30 MG capsule, Take 1 capsule (30 mg total) by mouth daily., Disp: 90 capsule, Rfl: 3   esomeprazole (NEXIUM) 40 MG capsule, TAKE ONE CAPSULE BY MOUTH DAILY BEFORE BREAKFAST., Disp: 30 capsule, Rfl: 5   estradiol (ESTRACE) 1 MG tablet, Take 1 tablet (1 mg total) by mouth daily., Disp: 90 tablet, Rfl: 3   famotidine (PEPCID) 20 MG tablet, TAKE ONE TABLET BY MOUTH AT BEDTIME AS NEEDED FOR HEARTBURN OR INDIGESTION., Disp: 30 tablet, Rfl: 0   hydrOXYzine (ATARAX) 50 MG tablet, Take 1 tablet (50 mg total) by mouth 3 (three) times daily as needed., Disp: 90 tablet, Rfl: 2   meclizine (ANTIVERT) 25 MG tablet, Take 25 mg by mouth 3 (three) times daily as needed for dizziness., Disp: , Rfl:     Multiple Vitamin (DAILY VITAMIN) tablet, Take 1 tablet by mouth daily. For low Vitamin, Disp: , Rfl:    ondansetron (ZOFRAN) 4 MG tablet, Take 4 mg by  mouth every 8 (eight) hours as needed for nausea or vomiting., Disp: , Rfl:    polyethylene glycol powder (GLYCOLAX/MIRALAX) powder, TAKE 34 GRAMS (TWO CAPFULS) IN 8 OZ OF FLUID TWICE DAILY, Disp: , Rfl: 5   Red Yeast Rice Extract 600 MG CAPS, Take 600 mg by mouth in the morning and at bedtime., Disp: , Rfl:    fexofenadine (ALLEGRA) 180 MG tablet, Take 180 mg by mouth daily as needed for allergies or rhinitis. (Patient not taking: Reported on 11/13/2021), Disp: , Rfl:    hydrocortisone (ANUSOL-HC) 2.5 % rectal cream, Place 1 application rectally 2 (two) times daily. (Patient not taking: Reported on 11/13/2021), Disp: 28 g, Rfl: 2   ibuprofen (ADVIL,MOTRIN) 200 MG tablet, Take 400 mg by mouth 2 (two) times daily as needed.  (Patient not taking: Reported on 11/13/2021), Disp: , Rfl:   Review of Systems  Review of Systems  Constitutional: Negative for fever, chills, weight loss, malaise/fatigue and diaphoresis.  HENT: Negative for hearing loss, ear pain, nosebleeds, congestion, sore throat, neck pain, tinnitus and ear discharge.   Eyes: Negative for blurred vision, double vision, photophobia, pain, discharge and redness.  Respiratory: Negative for cough, hemoptysis, sputum production, shortness of breath, wheezing and stridor.   Cardiovascular: Negative for chest pain, palpitations, orthopnea, claudication, leg swelling and PND.  Gastrointestinal: negative for abdominal pain. Negative for heartburn, nausea, vomiting, diarrhea, constipation, blood in stool and melena.  Genitourinary: Negative for dysuria, urgency, frequency, hematuria and flank pain.  Musculoskeletal: Negative for myalgias, back pain, joint pain and falls.  Skin: Negative for itching and rash.  Neurological: Negative for dizziness, tingling, tremors, sensory change, speech change, focal  weakness, seizures, loss of consciousness, weakness and headaches.  Endo/Heme/Allergies: Negative for environmental allergies and polydipsia. Does not bruise/bleed easily.  Psychiatric/Behavioral: Negative for depression, suicidal ideas, hallucinations, memory loss and substance abuse. The patient is not nervous/anxious and does not have insomnia.        Objective:  Blood pressure 124/73, pulse 78, height 5' 3.75" (1.619 m), weight 177 lb (80.3 kg), last menstrual period 05/27/2015.   Physical Exam  Vitals reviewed. Constitutional: She is oriented to person, place, and time. She appears well-developed and well-nourished.  HENT:  Head: Normocephalic and atraumatic.        Right Ear: External ear normal.  Left Ear: External ear normal.  Nose: Nose normal.  Mouth/Throat: Oropharynx is clear and moist.  Eyes: Conjunctivae and EOM are normal. Pupils are equal, round, and reactive to light. Right eye exhibits no discharge. Left eye exhibits no discharge. No scleral icterus.  Neck: Normal range of motion. Neck supple. No tracheal deviation present. No thyromegaly present.  Cardiovascular: Normal rate, regular rhythm, normal heart sounds and intact distal pulses.  Exam reveals no gallop and no friction rub.   No murmur heard. Respiratory: Effort normal and breath sounds normal. No respiratory distress. She has no wheezes. She has no rales. She exhibits no tenderness.  GI: Soft. Bowel sounds are normal. She exhibits no distension and no mass. There is no tenderness. There is no rebound and no guarding.  Genitourinary:  Breasts no masses skin changes or nipple changes bilaterally      Vulva is normal without lesions Vagina is pink moist without discharge Cervix  Uterus is absent Adnexa is negative  {Rectal    hemoccult negative, normal tone, no masses   Musculoskeletal: Normal range of motion. She exhibits no edema and no tenderness.  Neurological: She is alert and oriented to  person, place,  and time. She has normal reflexes. She displays normal reflexes. No cranial nerve deficit. She exhibits normal muscle tone. Coordination normal.  Skin: Skin is warm and dry. No rash noted. No erythema. No pallor.  Psychiatric: She has a normal mood and affect. Her behavior is normal. Judgment and thought content normal.       Medications Ordered at today's visit: Meds ordered this encounter  Medications   estradiol (ESTRACE) 1 MG tablet    Sig: Take 1 tablet (1 mg total) by mouth daily.    Dispense:  90 tablet    Refill:  3    Other orders placed at today's visit: No orders of the defined types were placed in this encounter.     Assessment:    Normal Gyn exam.   ERT Plan:    Hormone replacement therapy: hormone replacement therapy: estrace 1 mg daily. Follow up in: 3 months.     No follow-ups on file.

## 2021-11-24 ENCOUNTER — Telehealth (HOSPITAL_COMMUNITY): Payer: Self-pay | Admitting: Psychiatry

## 2021-12-31 ENCOUNTER — Other Ambulatory Visit (INDEPENDENT_AMBULATORY_CARE_PROVIDER_SITE_OTHER): Payer: Self-pay | Admitting: *Deleted

## 2021-12-31 DIAGNOSIS — K219 Gastro-esophageal reflux disease without esophagitis: Secondary | ICD-10-CM

## 2021-12-31 MED ORDER — ESOMEPRAZOLE MAGNESIUM 40 MG PO CPDR: DELAYED_RELEASE_CAPSULE | ORAL | 5 refills | Status: DC

## 2022-02-17 ENCOUNTER — Other Ambulatory Visit (INDEPENDENT_AMBULATORY_CARE_PROVIDER_SITE_OTHER): Payer: Self-pay | Admitting: Internal Medicine

## 2022-03-16 ENCOUNTER — Ambulatory Visit (INDEPENDENT_AMBULATORY_CARE_PROVIDER_SITE_OTHER): Payer: Self-pay | Admitting: Internal Medicine

## 2022-03-23 ENCOUNTER — Other Ambulatory Visit (INDEPENDENT_AMBULATORY_CARE_PROVIDER_SITE_OTHER): Payer: Self-pay | Admitting: Internal Medicine

## 2022-03-27 ENCOUNTER — Other Ambulatory Visit (HOSPITAL_COMMUNITY): Payer: Self-pay | Admitting: Psychiatry

## 2022-04-14 ENCOUNTER — Telehealth (INDEPENDENT_AMBULATORY_CARE_PROVIDER_SITE_OTHER): Payer: Self-pay | Admitting: Psychiatry

## 2022-04-14 ENCOUNTER — Encounter (HOSPITAL_COMMUNITY): Payer: Self-pay | Admitting: Psychiatry

## 2022-04-14 DIAGNOSIS — F332 Major depressive disorder, recurrent severe without psychotic features: Secondary | ICD-10-CM

## 2022-04-14 MED ORDER — DULOXETINE HCL 30 MG PO CPEP: 30.0000 mg | ORAL_CAPSULE | Freq: Every day | ORAL | 3 refills | Status: DC

## 2022-04-14 MED ORDER — HYDROXYZINE HCL 50 MG PO TABS: 50.0000 mg | ORAL_TABLET | Freq: Three times a day (TID) | ORAL | 3 refills | Status: DC | PRN

## 2022-04-14 NOTE — Progress Notes (Signed)
Virtual Visit via Telephone Note  I connected with Marie Hurley on 04/14/22 at 11:00 AM EDT by telephone and verified that I am speaking with the correct person using two identifiers.  Location: Patient: home Provider: office   I discussed the limitations, risks, security and privacy concerns of performing an evaluation and management service by telephone and the availability of in person appointments. I also discussed with the patient that there may be a patient responsible charge related to this service. The patient expressed understanding and agreed to proceed.     I discussed the assessment and treatment plan with the patient. The patient was provided an opportunity to ask questions and all were answered. The patient agreed with the plan and demonstrated an understanding of the instructions.   The patient was advised to call back or seek an in-person evaluation if the symptoms worsen or if the condition fails to improve as anticipated.  I provided 15 minutes of non-face-to-face time during this encounter.   Levonne Spiller, MD  [facility] MD/PA/NP OP Progress Note  04/14/2022 11:29 AM Marie Hurley  MRN:  [id number]  Chief Complaint:  Chief Complaint  Patient presents with   Anxiety   Depression   Follow-up   HPI: This patient is a 56 year old married white female who lives with her husband in [place]. She is on disability for migraine headaches and irritable bowel syndrome. 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  Overall she is doing fairly well.  She reports that her 89 year old nephew died in Feb 16, 2023 in his sleep.  This has been hard on the whole family.  She is trying her best to support her sister who is his mother.  She states that she is not significantly depressed and the increased hydroxyzine has helped her anxiety.  She denies any thoughts of self-harm or suicide.  She is  still dealing with some menopausal symptoms but has been continued on estrogen. Visit Diagnosis:    ICD-10-CM   1. Severe episode of recurrent major depressive disorder, without psychotic features ([place])  F33.2       Past Psychiatric History: Admission in May 2017 for angry agitated behavior and context of substance abuse  Past Medical History:  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 %   Migraine    PONV (postoperative nausea and vomiting)    Seasonal allergies    Severe major depressive disorder ([place])    Suicidal behavior     Past Surgical History:  Procedure Laterality Date   ABDOMINAL HYSTERECTOMY     CARPAL TUNNEL RELEASE     rt   CHOLECYSTECTOMY  2002   lap choli   COLONOSCOPY     COLONOSCOPY WITH PROPOFOL N/A 02/07/2014   Procedure: COLONOSCOPY WITH PROPOFOL;  Surgeon: Rogene Houston, MD;  Location: [facility];  Service: Endoscopy;  Laterality: N/A;  entered cecum @ 0756 ; total cecal withdrawal  time- 9 min   CYSTOCELE REPAIR N/A 06/10/2015   Procedure: ANTERIOR REPAIR (CYSTOCELE);  Surgeon: [name], MD;  Location: [facility];  Service: Gynecology;  Laterality: N/A;   DIAGNOSTIC LAPAROSCOPY  2007   exploratory-nic bowel duct   DILATION AND CURETTAGE OF UTERUS  '91 & '96   ERCP  2005,2007,2013   several   HEMORROIDECTOMY     RECTOCELE REPAIR N/A 06/10/2015   Procedure: POSTERIOR REPAIR (RECTOCELE);  Surgeon: [name], MD;  Location: [facility];  Service: Gynecology;  Laterality: N/A;   SHOULDER ARTHROSCOPY WITH SUBACROMIAL DECOMPRESSION, ROTATOR CUFF REPAIR AND BICEP TENDON REPAIR Right  05/24/2014   Procedure: RIGHT SHOULDER ARTHROSCOPY DEBRIDEMENT LIMITED, DECOMPRESSION SUBACROMIAL PARTIAL ACROMIOPLASTY WITH ROTATOR CUFF REPAIR;  Surgeon: Johnny Bridge, MD;  Location: [place];  Service: Orthopedics;  Laterality: Right;   TUBAL LIGATION     VAGINAL HYSTERECTOMY N/A 06/10/2015   Procedure: HYSTERECTOMY VAGINAL;  Surgeon: [name], MD;  Location: [facility];  Service: Gynecology;  Laterality: N/A;    Family Psychiatric History: See below  Family History:  Family History  Problem Relation Age of Onset   Hypertension Mother    Heart disease Mother    COPD Mother    Depression Mother    Anxiety disorder Mother    Heart disease Father    Diabetes Father    Hypertension Father    Anxiety disorder Father    Depression Father    Heart disease Sister    Depression Sister    Anxiety disorder Sister    Healthy Son    Healthy Daughter    Depression Sister    Anxiety disorder Sister    Depression Cousin    Depression Cousin    Depression Cousin     Social History:  Social History   Socioeconomic History   Marital status: Married    Spouse name: Not on file   Number of children: Not on file   Years of education: Not on file   Highest education level: Not on file  Occupational History   Not on file  Tobacco Use   Smoking status: Never   Smokeless tobacco: Never  Vaping Use   Vaping Use: Never used  Substance and Sexual Activity   Alcohol use: No    Alcohol/week: 1.0 standard drink of alcohol    Types: 1 Standard drinks or equivalent per week    Comment: rare 1-2 drinks per month, 04-27-16 per pt no and stopped 2 months ago   Drug use: No    Comment: 04-27-16 per pt no   Sexual activity: Yes    Partners: Male    Birth control/protection: Surgical    Comment: hyst  Other Topics Concern   Not on file  Social History Narrative   Not on file   Social Determinants of Health   Financial Resource Strain: Low Risk  (11/13/2021)   Overall  Financial Resource Strain (CARDIA)    Difficulty of Paying Living Expenses: Not very hard  Food Insecurity: No Food Insecurity (11/13/2021)   Hunger Vital Sign    Worried About Running Out of Food in the Last Year: Never true    Ran Out of Food in the Last Year: Never true  Transportation Needs: No Transportation Needs (11/13/2021)   PRAPARE - Hydrologist (Medical): No    Lack of Transportation (Non-Medical): No  Physical Activity: Inactive (11/13/2021)   Exercise Vital Sign    Days  of Exercise per Week: 0 days    Minutes of Exercise per Session: 0 min  Stress: No Stress Concern Present (11/13/2021)   [place]    Feeling of Stress : Only a little  Social Connections: Moderately Integrated (11/13/2021)   Social Connection and Isolation Panel [NHANES]    Frequency of Communication with Friends and Family: More than three times a week    Frequency of Social Gatherings with Friends and Family: Twice a week    Attends Religious Services: More than 4 times per year    Active Member of [employer] or Organizations: No    Attends Archivist Meetings: Never    Marital Status: Married    Allergies:  Allergies  Allergen Reactions   Penicillins Hives    Received 2GM Ancef preop with no reaction.     Promethazine Hcl Nausea And Vomiting    Metabolic Disorder Labs: No results found for: "HGBA1C", "MPG" No results found for: "PROLACTIN" Lab Results  Component Value Date   CHOL 231 (H) 07/22/2017   TRIG 76 07/22/2017   HDL 87 07/22/2017   CHOLHDL 2.7 07/22/2017   LDLCALC 129 (H) 07/22/2017   Lab Results  Component Value Date   TSH 1.060 07/22/2017    Therapeutic Level Labs: No results found for: "LITHIUM" No results found for: "VALPROATE" No results found for: "CBMZ"  Current Medications: Current Outpatient Medications  Medication Sig Dispense Refill   dicyclomine (BENTYL) 20 MG  tablet Take 1 tablet (20 mg total) by mouth 3 (three) times daily as needed for spasms. 90 tablet 2   DULoxetine (CYMBALTA) 30 MG capsule Take 1 capsule (30 mg total) by mouth daily. 90 capsule 3   esomeprazole (NEXIUM) 40 MG capsule Take one capsule daily before breaksfast 30 capsule 5   estradiol (ESTRACE) 1 MG tablet Take 1 tablet (1 mg total) by mouth daily. 90 tablet 3   famotidine (PEPCID) 20 MG tablet TAKE 1 TABLET BY MOUTH AT BEDTIME AS NEEDED FOR HEARTBURN OR INDIGESTION 30 tablet 3   hydrOXYzine (ATARAX) 50 MG tablet Take 1 tablet (50 mg total) by mouth 3 (three) times daily as needed. 90 tablet 3   meclizine (ANTIVERT) 25 MG tablet Take 25 mg by mouth 3 (three) times daily as needed for dizziness.     Multiple Vitamin (DAILY VITAMIN) tablet Take 1 tablet by mouth daily. For low Vitamin     ondansetron (ZOFRAN) 4 MG tablet Take 4 mg by mouth every 8 (eight) hours as needed for nausea or vomiting.     polyethylene glycol powder (GLYCOLAX/MIRALAX) powder TAKE 34 GRAMS (TWO CAPFULS) IN 8 OZ OF FLUID TWICE DAILY  5   Red Yeast Rice Extract 600 MG CAPS Take 600 mg by mouth in the morning and at bedtime.     No current facility-administered medications for this visit.     Musculoskeletal: Strength & Muscle Tone: na Gait & Station: na Patient leans: N/A  Psychiatric Specialty Exam: Review of Systems  All other systems reviewed and are negative.   Last menstrual period 05/27/2015.There is no height or weight on file to calculate BMI.  General Appearance: NA  Eye Contact:  NA  Speech:  Clear and Coherent  Volume:  Normal  Mood:  Euthymic  Affect:  NA  Thought Process:  Goal Directed  Orientation:  Full (Time, Place, and Person)  Thought Content: WDL   Suicidal Thoughts:  No  Homicidal Thoughts:  No  Memory:  Immediate;   Good Recent;   Good Remote;   Good  Judgement:  Good  Insight:  Good  Psychomotor Activity:  Normal  Concentration:  Concentration: Good and Attention  Span: Good  Recall:  Good  Fund of Knowledge: Good  Language: Good  Akathisia:  No  Handed:  Right  AIMS (if indicated): not done  Assets:  Communication Skills Desire for Improvement Physical Health Resilience Social Support Talents/Skills  ADL's:  Intact  Cognition: WNL  Sleep:  Good   Screenings: AIMS    Flowsheet Row Admission (Discharged) from 03/02/2016 in [facility]  AIMS Total Score 0      GAD-7    [place] Office Visit from 11/13/2021 in [facility]  Total GAD-7 Score 1      MDI    [place] Visit from 04/27/2016 in [facility]  Total Score (max 50) 27      PHQ2-9    Flowsheet Row Video Visit from 04/14/2022 in [place] Office Visit from 11/13/2021 in [facility] Video Visit from 11/06/2021 in [facility] Video Visit from 05/11/2021 in [place] Office Visit from 07/22/2017 in [facility]  PHQ-2 Total Score 0 0 0 0 2  PHQ-9 Total Score -- 1 -- -- 6      Flowsheet Row Video Visit from 04/14/2022 in [facility] Video Visit from 11/06/2021 in [facility] Video Visit from 05/11/2021 in [place] No Risk No Risk No Risk        Assessment and Plan:  This patient is a 56 year old female with a history depression anxiety and a remote history of substance abuse.  She is doing well on her current regimen.  She will continue Cymbalta 60 mg daily for depression and hydroxyzine 50 mg up to 3 times daily as needed for anxiety.  She will return to see me in 6 months Collaboration of Care: Collaboration of Care: Primary Care Provider AEB notes will be shared with PCP at patient  request  Patient/Guardian was advised Release of Information must be obtained prior to any record release in order to collaborate their care with an outside provider. Patient/Guardian was advised if they have not already done so to contact the registration department to sign all necessary forms in order for [place] to release information regarding their care.   Consent: Patient/Guardian gives verbal consent for treatment and assignment of benefits for services provided during this visit. Patient/Guardian expressed understanding and agreed to proceed.    Levonne Spiller, MD 04/14/2022, 11:29 AM

## 2022-06-04 ENCOUNTER — Other Ambulatory Visit (INDEPENDENT_AMBULATORY_CARE_PROVIDER_SITE_OTHER): Payer: Self-pay | Admitting: Internal Medicine

## 2022-06-04 DIAGNOSIS — K219 Gastro-esophageal reflux disease without esophagitis: Secondary | ICD-10-CM

## 2022-06-19 ENCOUNTER — Other Ambulatory Visit (INDEPENDENT_AMBULATORY_CARE_PROVIDER_SITE_OTHER): Payer: Self-pay | Admitting: Internal Medicine

## 2022-07-12 ENCOUNTER — Other Ambulatory Visit (INDEPENDENT_AMBULATORY_CARE_PROVIDER_SITE_OTHER): Payer: Self-pay | Admitting: Internal Medicine

## 2022-07-12 ENCOUNTER — Other Ambulatory Visit: Payer: Self-pay | Admitting: Obstetrics & Gynecology

## 2022-07-12 ENCOUNTER — Telehealth (INDEPENDENT_AMBULATORY_CARE_PROVIDER_SITE_OTHER): Payer: Self-pay | Admitting: *Deleted

## 2022-07-12 DIAGNOSIS — K219 Gastro-esophageal reflux disease without esophagitis: Secondary | ICD-10-CM

## 2022-07-12 NOTE — Telephone Encounter (Signed)
Discussed with patient that she needs Office visit since its been over one year and let her know scheduler would get her scheduled. Refills sent to get her through til next appt.

## 2022-07-12 NOTE — Telephone Encounter (Signed)
Patient called and left message she is completely out of meds. She was last seen over one year ago 11/18/20. I called and left message to return call to let her know she needs visit.

## 2022-07-12 NOTE — Telephone Encounter (Signed)
Discussed with patient that she needs Office visit since its been over one year and let her know scheduler would get her scheduled. Refills sent to get her through til next appt. Mitzie, please schedule appt.

## 2022-08-30 ENCOUNTER — Encounter (INDEPENDENT_AMBULATORY_CARE_PROVIDER_SITE_OTHER): Payer: Self-pay | Admitting: *Deleted

## 2022-08-30 ENCOUNTER — Ambulatory Visit (INDEPENDENT_AMBULATORY_CARE_PROVIDER_SITE_OTHER): Payer: BLUE CROSS/BLUE SHIELD | Admitting: Gastroenterology

## 2022-08-30 ENCOUNTER — Encounter (INDEPENDENT_AMBULATORY_CARE_PROVIDER_SITE_OTHER): Payer: Self-pay | Admitting: Gastroenterology

## 2022-08-30 DIAGNOSIS — K219 Gastro-esophageal reflux disease without esophagitis: Secondary | ICD-10-CM

## 2022-08-30 DIAGNOSIS — K649 Unspecified hemorrhoids: Secondary | ICD-10-CM | POA: Diagnosis not present

## 2022-08-30 DIAGNOSIS — K625 Hemorrhage of anus and rectum: Secondary | ICD-10-CM

## 2022-08-30 DIAGNOSIS — R1011 Right upper quadrant pain: Secondary | ICD-10-CM | POA: Diagnosis not present

## 2022-08-30 DIAGNOSIS — K581 Irritable bowel syndrome with constipation: Secondary | ICD-10-CM | POA: Diagnosis not present

## 2022-08-30 NOTE — Progress Notes (Addendum)
Primary Care Physician:  Glenda Chroman, MD  Primary GI: previously rehman  Patient Location: Home   Provider Location: [facility]   Reason for Visit: Follow up of GERD, IBS-C   Persons present on the virtual encounter, with roles:  L. Alver Sorrow, MSN, APRN, AGNP-C, Marie Hurley, patient    Total time (minutes) spent on medical discussion: 20 minutes  Virtual Visit via Telephone visit is conducted virtually and was requested by patient.   I connected with Marie Hurley on 08/30/22 at  4:00 PM EST by telephone and verified that I am speaking with the correct person using two identifiers.   I discussed the limitations, risks, security and privacy concerns of performing an evaluation and management service by telephone and the availability of in person appointments. I also discussed with the patient that there may be a patient responsible charge related to this service. The patient expressed understanding and agreed to proceed.  Chief Complaint  Patient presents with   Gastroesophageal Reflux    Follow up on GERD, IBS, and constipation. Has concerns about hemhorroids. Using hydrocortisone 2.5% cream and not helping much. Had surgery for hemhorroids back in 2016.    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  Last seen February 2022, at that time on pepcid with heartburn no more than once a week, taking miralax for constipation, having occasional rectal bleeding. Also with some RUQ pain 2-3x/month, dicyclomine helps with pain. No nausea or vomiting.   Recommended continue dicyclomine, add fiber daily and repeat TCS in April 2025  Last labs in January 2022 with normal LFTs.  Present:  Patient reports that she has continued to have upper  mid to right abdominal spasms, this occurs a few times per month, will last about 45 minutes to an hour.  She takes tylenol or an NSAID when this occurs. She has dicyclomine which she takes, sometimes has improvement in pain but not always, sometimes has to rest to get pain to improve. Pain is not precipitated by anything specific. She does have nausea associated with it as well as develops a headache thereafter.   Constipation is well managed with miralax 2-4 capfuls per day, she is having a BM every day. Feels that this regimen works well for her. Denies melena. She has chronic toilet tissue hematochezia, this has been ongoing since her hemorrhoidectomy in 2016. Blood volume can be just a swipe on the toilet paper to sometimes larger clots when wiping. She does endorse pain, itching, burning, she is using the hydrocortisone 2.5% cream without much result. She notes that hemorrhoids stay on the outside of her rectum.   GERD well managed with nexium '40mg'$  daily, taking famotidine '20mg'$  PRN maybe 1-2x/week. Denies dysphagia or odynophgia.    Last colonoscopy:  April 2015 revealing small external hemorrhoids anal papillae and skin tags.    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 %   Migraine    PONV (postoperative nausea and vomiting)    Seasonal allergies    Severe major depressive disorder ([place])    Suicidal behavior      Past Surgical History:  Procedure Laterality Date   ABDOMINAL HYSTERECTOMY     CARPAL TUNNEL RELEASE     rt   CHOLECYSTECTOMY  2002   lap choli    COLONOSCOPY     COLONOSCOPY WITH PROPOFOL N/A 02/07/2014   Procedure: COLONOSCOPY WITH PROPOFOL;  Surgeon: Rogene Houston, MD;  Location: [facility];  Service: Endoscopy;  Laterality: N/A;  entered cecum @ 0756 ; total cecal withdrawal time- 9 min   CYSTOCELE REPAIR N/A 06/10/2015   Procedure: ANTERIOR REPAIR (CYSTOCELE);  Surgeon: [name], MD;  Location: [facility];  Service: Gynecology;  Laterality: N/A;   DIAGNOSTIC LAPAROSCOPY  2007   exploratory-nic bowel duct   DILATION AND CURETTAGE OF UTERUS  '91 & '96   ERCP  2005,2007,2013   several   HEMORROIDECTOMY     RECTOCELE REPAIR N/A 06/10/2015   Procedure: POSTERIOR REPAIR (RECTOCELE);  Surgeon: [name], MD;  Location: [facility];  Service: Gynecology;  Laterality: N/A;   SHOULDER ARTHROSCOPY WITH SUBACROMIAL DECOMPRESSION, ROTATOR CUFF REPAIR AND BICEP TENDON REPAIR Right 05/24/2014   Procedure: RIGHT SHOULDER ARTHROSCOPY DEBRIDEMENT LIMITED, DECOMPRESSION SUBACROMIAL PARTIAL ACROMIOPLASTY WITH ROTATOR CUFF REPAIR;  Surgeon: Johnny Bridge, MD;  Location: [facility];  Service: Orthopedics;  Laterality: Right;   TUBAL LIGATION     VAGINAL HYSTERECTOMY N/A 06/10/2015   Procedure: HYSTERECTOMY VAGINAL;  Surgeon: [name], MD;  Location: [facility];  Service: Gynecology;  Laterality: N/A;     Current Meds  Medication Sig   atorvastatin (LIPITOR) 10 MG tablet Take 10 mg by mouth daily.   dicyclomine (BENTYL) 20 MG tablet Take 1 tablet (20 mg total) by mouth 3 (three) times daily as needed for spasms.   DULoxetine (CYMBALTA) 30 MG capsule Take 1 capsule (30 mg total) by mouth daily.   esomeprazole (NEXIUM) 40 MG capsule TAKE ONE CAPSULE DAILY BEFORE BREAKFAST (INS ONLY COVERS 90 DAYS)   estradiol (ESTRACE) 1 MG tablet TAKE 1 TABLET BY MOUTH EVERY DAY   famotidine (PEPCID) 20 MG tablet TAKE 1 TABLET BY MOUTH AT BEDTIME AS NEEDED FOR HEARTBURN OR INDIGESTION   hydrOXYzine (ATARAX) 50 MG tablet Take 1 tablet (50 mg total) by  mouth 3 (three) times daily as needed.   meclizine (ANTIVERT) 25 MG tablet Take 25 mg by mouth 3 (three) times daily as needed for dizziness.   Multiple Vitamin (DAILY VITAMIN) tablet Take 1 tablet by mouth daily. For low Vitamin   ondansetron (ZOFRAN) 4 MG tablet Take 4 mg by mouth every 8 (eight) hours as needed for nausea or vomiting.   polyethylene glycol powder (GLYCOLAX/MIRALAX) powder TAKE 34 GRAMS (TWO CAPFULS) IN 8 OZ OF FLUID TWICE DAILY     Family History  Problem Relation Age of Onset   Hypertension Mother    Heart disease Mother    COPD Mother    Depression Mother    Anxiety disorder Mother    Heart disease Father    Diabetes Father    Hypertension Father    Anxiety disorder Father    Depression Father    Heart disease Sister  Depression Sister    Anxiety disorder Sister    Healthy Son    Healthy Daughter    Depression Sister    Anxiety disorder Sister    Depression Cousin    Depression Cousin    Depression Cousin     Social History   Socioeconomic History   Marital status: Married    Spouse name: Not on file   Number of children: Not on file   Years of education: Not on file   Highest education level: Not on file  Occupational History   Not on file  Tobacco Use   Smoking status: Never   Smokeless tobacco: Never  Vaping Use   Vaping Use: Never used  Substance and Sexual Activity   Alcohol use: No    Alcohol/week: 1.0 standard drink of alcohol    Types: 1 Standard drinks or equivalent per week    Comment: rare 1-2 drinks per month, 04-27-16 per pt no and stopped 2 months ago   Drug use: No    Comment: 04-27-16 per pt no   Sexual activity: Yes    Partners: Male    Birth control/protection: Surgical    Comment: hyst  Other Topics Concern   Not on file  Social History Narrative   Not on file   Social Determinants of Health   Financial Resource Strain: Low Risk  (11/13/2021)   Overall Financial Resource Strain (CARDIA)    Difficulty of Paying  Living Expenses: Not very hard  Food Insecurity: No Food Insecurity (11/13/2021)   Hunger Vital Sign    Worried About Running Out of Food in the Last Year: Never true    [place] in the Last Year: Never true  Transportation Needs: No Transportation Needs (11/13/2021)   PRAPARE - Hydrologist (Medical): No    Lack of Transportation (Non-Medical): No  Physical Activity: Inactive (11/13/2021)   Exercise Vital Sign    Days of Exercise per Week: 0 days    Minutes of Exercise per Session: 0 min  Stress: No Stress Concern Present (11/13/2021)   [place]    Feeling of Stress : Only a little  Social Connections: Moderately Integrated (11/13/2021)   Social Connection and Isolation Panel [NHANES]    Frequency of Communication with Friends and Family: More than three times a week    Frequency of Social Gatherings with Friends and Family: Twice a week    Attends Religious Services: More than 4 times per year    Active Member of [employer] or Organizations: No    Attends Archivist Meetings: Never    Marital Status: Married   Review of Systems: Gen: Denies fever, chills, anorexia. Denies fatigue, weakness, weight loss.  CV: Denies chest pain, palpitations, syncope, peripheral edema, and claudication. Resp: Denies dyspnea at rest, cough, wheezing, coughing up blood, and pleurisy. GI: see HPI Derm: Denies rash, itching, dry skin Psych: Denies depression, anxiety, memory loss, confusion. No homicidal or suicidal ideation.  Heme: Denies bruising, bleeding, and enlarged lymph nodes.  Observations/Objective: No distress. Unable to perform physical exam due to telephone encounter. No video available.   Assessment and Plan: Rajanae Mantia is a 56 year old female who presents virtually for follow up of GERD, IBS, RUQ Pain and rectal bleeding/hemorrhoids.  GERD: well managed with daily nexium '40mg'$  and  famotidine PRN, will continue with current regimen.  IBS-C: well managed with miralax 2-4 capfuls per  day, having a BM usually daily. Will continue with current regimen  RUQ Pain: this has been chronic, intermittent, occurring still 2-3x/month. She is s/p cholecystectomy. Sometimes improved with dicyclomine, sometimes only rest improves symptoms. Has associated nausea and sometimes develops a headache after. She has been having this pain for the past few years. 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 No recent labs, will update CMP today. Can continue to utilize dicyclomine as needed for pain.   Rectal bleeding/Hemorrhoids: Significant history of hemorrhoids with previous hemorrhoidectomy and 2016.  She reports she has had intermittent rectal bleeding since then with toilet tissue hematochezia and occasional clots noted on toilet paper.  Bleeding occurs with almost every BM.  She is using hydrocortisone cream without much result.  Having some rectal pain itching and burning.  Notably last colonoscopy was in 2015 with small external hemorrhoids.  She denies any weight loss or changes in appetite.  Unfortunately as this was a virtual visit I was unable to do a rectal exam today for further evaluation, she does note that she feels hemorrhoids externally all the time.  Given frequency of rectal bleeding with presence of clots and timeframe since last colonoscopy I did recommend updating colonoscopy at this time. Patient is unsure at this time she would like to proceed with colonoscopy, she will let me know what she decides. If she decides on doing colonoscopy and this reveals internal hemorrhoids we can consider hemorrhoid banding in future.  n the meantime I will send [facility] hemorrhoid compound to be used up to 4 times a day.  She should avoid straining and limit toilet time to less than 5 minutes.  We will check CBC as well to ensure no presence of  anemia as she has not had any recent blood work and is having rectal bleeding daily.  -continue nexium '40mg'$  daily -famotidine PRN -continue current miralax dose -avoid strainign and limit toilet time to no more than 5 minutes -continue dicyclomine PRN for abdominal pain -CMP and CBC -patient to make me aware if she wishes to proceed with colonoscopy   Follow Up Instructions: 6 months   I discussed the assessment and treatment plan with the patient. The patient was provided an opportunity to ask questions and all were answered. The patient agreed with the plan and demonstrated an understanding of the instructions.   The patient was advised to call back or seek an in-person evaluation if the symptoms worsen or if the condition fails to improve as anticipated.  I provided 20 minutes of NON  face-to-face time during this telephone encounter   [place]. Alver Sorrow, MSN, APRN, AGNP-C Adult-Gerontology Nurse Practitioner [facility] at [facility]  I have reviewed the note and agree with the APP's assessment as described in this progress note  Maylon Peppers, MD Gastroenterology and Hepatology [facility]

## 2022-08-30 NOTE — Patient Instructions (Addendum)
Please continue with miralax, avoid straining and limit toilet time to no more than 5 minutes I have sent over a hemorrhoid compound cream to [employer], you can apply this up to 4x/day. Please let me know if you wish to proceed with a colonoscopy. We can potentially do hemorrhoid banding, thereafter, if you have internal hemorrhoids present.  We will check some basic labs to ensure liver function is normal and blood counts are okay Please continue nexium daily and famotidine as needed for acid reflux symptoms  Follow up 6 months

## 2022-09-07 ENCOUNTER — Other Ambulatory Visit (HOSPITAL_COMMUNITY): Payer: Self-pay | Admitting: Psychiatry

## 2022-09-30 ENCOUNTER — Other Ambulatory Visit (HOSPITAL_COMMUNITY): Payer: Self-pay | Admitting: Psychiatry

## 2022-10-06 ENCOUNTER — Other Ambulatory Visit (INDEPENDENT_AMBULATORY_CARE_PROVIDER_SITE_OTHER): Payer: Self-pay | Admitting: Gastroenterology

## 2022-10-06 DIAGNOSIS — K219 Gastro-esophageal reflux disease without esophagitis: Secondary | ICD-10-CM

## 2022-11-01 ENCOUNTER — Ambulatory Visit (INDEPENDENT_AMBULATORY_CARE_PROVIDER_SITE_OTHER): Payer: BLUE CROSS/BLUE SHIELD | Admitting: Gastroenterology

## 2022-12-29 ENCOUNTER — Other Ambulatory Visit (INDEPENDENT_AMBULATORY_CARE_PROVIDER_SITE_OTHER): Payer: Self-pay | Admitting: Gastroenterology

## 2023-01-04 ENCOUNTER — Other Ambulatory Visit (INDEPENDENT_AMBULATORY_CARE_PROVIDER_SITE_OTHER): Payer: Self-pay | Admitting: Gastroenterology

## 2023-01-04 DIAGNOSIS — K219 Gastro-esophageal reflux disease without esophagitis: Secondary | ICD-10-CM

## 2023-03-01 ENCOUNTER — Ambulatory Visit (INDEPENDENT_AMBULATORY_CARE_PROVIDER_SITE_OTHER): Payer: BLUE CROSS/BLUE SHIELD | Admitting: Gastroenterology

## 2023-03-30 ENCOUNTER — Other Ambulatory Visit (INDEPENDENT_AMBULATORY_CARE_PROVIDER_SITE_OTHER): Payer: Self-pay | Admitting: Gastroenterology

## 2023-03-30 DIAGNOSIS — K219 Gastro-esophageal reflux disease without esophagitis: Secondary | ICD-10-CM

## 2023-04-21 ENCOUNTER — Ambulatory Visit (INDEPENDENT_AMBULATORY_CARE_PROVIDER_SITE_OTHER): Payer: BLUE CROSS/BLUE SHIELD | Admitting: Gastroenterology

## 2023-04-21 ENCOUNTER — Encounter (INDEPENDENT_AMBULATORY_CARE_PROVIDER_SITE_OTHER): Payer: Self-pay | Admitting: Gastroenterology

## 2023-04-21 ENCOUNTER — Telehealth (HOSPITAL_COMMUNITY): Payer: Self-pay

## 2023-04-21 VITALS — BP 128/85 | HR 83 | Temp 98.6°F | Ht 63.75 in | Wt 175.9 lb

## 2023-04-21 DIAGNOSIS — K625 Hemorrhage of anus and rectum: Secondary | ICD-10-CM | POA: Diagnosis not present

## 2023-04-21 DIAGNOSIS — R194 Change in bowel habit: Secondary | ICD-10-CM | POA: Diagnosis not present

## 2023-04-21 DIAGNOSIS — K219 Gastro-esophageal reflux disease without esophagitis: Secondary | ICD-10-CM | POA: Diagnosis not present

## 2023-04-21 DIAGNOSIS — R1011 Right upper quadrant pain: Secondary | ICD-10-CM | POA: Diagnosis not present

## 2023-04-21 MED ORDER — ESOMEPRAZOLE MAGNESIUM 40 MG PO CPDR: DELAYED_RELEASE_CAPSULE | ORAL | 3 refills | Status: DC

## 2023-04-21 MED ORDER — FAMOTIDINE 40 MG PO TABS: 40.0000 mg | ORAL_TABLET | Freq: Every day | ORAL | 3 refills | Status: DC | PRN

## 2023-04-21 NOTE — Telephone Encounter (Signed)
yes

## 2023-04-21 NOTE — Telephone Encounter (Signed)
Pt scheduled  

## 2023-04-21 NOTE — Patient Instructions (Addendum)
We will check some labs work to look at blood counts/ liver function We will Schedule colonoscopy  Continue with bentyl as needed  Nexium 40mg  continued daily-refill sent  Increase famotidine to 40mg , use this as needed daily for reflux symptoms  Follow up 3-4 months  It was a pleasure to see you today. I want to create trusting relationships with patients and provide genuine, compassionate, and quality care. I truly value your feedback! please be on the lookout for a survey regarding your visit with me today. I appreciate your input about our visit and your time in completing this!     L. Jeanmarie Hubert, MSN, APRN, AGNP-C Adult-Gerontology Nurse Practitioner [facility] at [facility]

## 2023-04-21 NOTE — Telephone Encounter (Signed)
Pt presented in office to see if she had an appt scheduled with Dr Tenny Craw. Pt last seen 04/14/22. Is it ok to schedule with you? Please advise.

## 2023-04-21 NOTE — Progress Notes (Addendum)
Referring Provider: Ignatius Specking, MD Primary Care Physician:  Ignatius Specking, MD Primary GI Physician: Levon Hedger   Chief Complaint  Patient presents with   Hemorrhoids    Follow up on hemorrhoid and rectal bleeding. Using otc cream and prescription rx.    Abdominal Pain    Has episodes of sharp pain in mid abdomen. Happens about once a month. Had 3  ERCPs and last one about 10 years ago. Pain feels the same as when she had to have ercp in the past. Pain last a couple of hours.    HPI:   Marie Hurley is a 57 y.o. 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  Taking tylenol/nsaids or bentyl with some improvement, some associated nausea. Constipation well managed with miralax 2-4 capfuls per day. chronic toilet tissue hematochezia, this has been ongoing since her hemorrhoidectomy in 2016. Blood volume can be just a swipe on the toilet paper to sometimes larger clots when wiping. She does endorse pain, itching, burning,  using the hydrocortisone 2.5% cream without much result. GERD well managed with nexium 40mg  daily, taking famotidine 20mg  PRN   Recommended to continue with nexium 40mg  daily, famotidine PRN, continue with current miralax dose, avoid straining, limit toilet time, continue dicyclomine PRN, CMP, CBC, patient to let us know if she wished to update Colonoscopy.   Labs were not completed as ordered  Present:  Doing nexium 40mg  daily, she does okay with this. She is doing famotidine 20mg  maybe 2-3x/week. She occasionally has to take rolaids after the famotidine if famotidine does not ease her  indigestion. Denies dysphagia or odynophagia.  Previously with more constipation, now looser stools/diarrhea she has atleast 2-3 BMs/day. She notes that stools are usually very soft but when she gets nervous or anxious she will have more diarrhea. She is still having rectal bleeding, this is occurring daily, she notes blood in the toilet as well as on toilet tissue. She notes some lower back pain that occurs prior to defecation and improves after she has a BM. She notes rectal spasms at times. She has history of hemorrhoids/hemorrhoidectomy in the past, she is using the anusol rectal cream, preparation H suppositories, did not have improvement with [facility] hemorrhoid cream. She notes she does feel/see hemorrhoids externally.   She continues to have episodes of RUQ pain a few times per month. This has been chronic, no precipitating factors. Does not seem to matter what she ate. She notes she continues to use either tylenol/ibuprofen or bentyl which helps, pain will eventually ease off. Sometimes will have to lay down for it to ease up. Continues to have nausea associated with pain. No vomiting. No recent labs done with PCP.   Last Colonoscopy:April 2015 revealing small external hemorrhoids anal papillae and skin tags.    Last ERCP: [facility] in 2013 Last Endoscopy:  Recommendations:    Past Medical History:  Diagnosis Date   Anemia    Anxiety    Arthritis    Basal cell carcinoma of chest wall 2015   rIGHT IN THE MIDDLE OF THE PATIENT'S CHEST   Bowel obstruction (HCC)  Complete rotator cuff tear 05/24/2014   Depression    GERD (gastroesophageal reflux disease)    Hx MRSA infection 2008   multiple skin lesions   IBS (irritable bowel syndrome)    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 %   Migraine    PONV (postoperative nausea and vomiting)    Seasonal allergies    Severe major depressive disorder (HCC)    Suicidal behavior     Past Surgical History:   Procedure Laterality Date   ABDOMINAL HYSTERECTOMY     CARPAL TUNNEL RELEASE     rt   CHOLECYSTECTOMY  2002   lap choli   COLONOSCOPY     COLONOSCOPY WITH PROPOFOL N/A 02/07/2014   Procedure: COLONOSCOPY WITH PROPOFOL;  Surgeon: Malissa Hippo, MD;  Location: [facility];  Service: Endoscopy;  Laterality: N/A;  entered cecum @ 0756 ; total cecal withdrawal time- 9 min   CYSTOCELE REPAIR N/A 06/10/2015   Procedure: ANTERIOR REPAIR (CYSTOCELE);  Surgeon: Tilda Burrow, MD;  Location: [facility];  Service: Gynecology;  Laterality: N/A;   DIAGNOSTIC LAPAROSCOPY  2007   exploratory-nic bowel duct   DILATION AND CURETTAGE OF UTERUS  '91 & '96   ERCP  2005,2007,2013   several   HEMORROIDECTOMY     RECTOCELE REPAIR N/A 06/10/2015   Procedure: POSTERIOR REPAIR (RECTOCELE);  Surgeon: Tilda Burrow, MD;  Location: [facility];  Service: Gynecology;  Laterality: N/A;   SHOULDER ARTHROSCOPY WITH SUBACROMIAL DECOMPRESSION, ROTATOR CUFF REPAIR AND BICEP TENDON REPAIR Right 05/24/2014   Procedure: RIGHT SHOULDER ARTHROSCOPY DEBRIDEMENT LIMITED, DECOMPRESSION SUBACROMIAL PARTIAL ACROMIOPLASTY WITH ROTATOR CUFF REPAIR;  Surgeon: Eulas Post, MD;  Location: [facility];  Service: Orthopedics;  Laterality: Right;   TUBAL LIGATION     VAGINAL HYSTERECTOMY N/A 06/10/2015   Procedure: HYSTERECTOMY VAGINAL;  Surgeon: Tilda Burrow, MD;  Location: [facility];  Service: Gynecology;  Laterality: N/A;    Current Outpatient Medications  Medication Sig Dispense Refill   atorvastatin (LIPITOR) 10 MG tablet Take 10 mg by mouth daily.     dicyclomine (BENTYL) 20 MG tablet Take 1 tablet (20 mg total) by mouth 3 (three) times daily as needed for spasms. 90 tablet 2   DULoxetine (CYMBALTA) 30 MG capsule Take 1 capsule (30 mg total) by mouth daily. 90 capsule 3   esomeprazole (NEXIUM) 40 MG capsule TAKE ONE CAPSULE DAILY BEFORE BREAKFAST (INS ONLY COVERS 90 DAYS) 90 capsule 0   estradiol (ESTRACE) 1 MG tablet TAKE 1  TABLET BY MOUTH EVERY DAY 90 tablet 2   famotidine (PEPCID) 20 MG tablet TAKE 1 TABLET BY MOUTH AT BEDTIME AS NEEDED FOR HEARTBURN OR INDIGESTION 90 tablet 0   hydrocortisone (ANUSOL-HC) 2.5 % rectal cream APPLY ONE APPLICATION RECTALLY TWICE DAILY 30 g 2   hydrOXYzine (ATARAX) 50 MG tablet TAKE 1 TABLET BY MOUTH THREE TIMES A DAY AS NEEDED 270 tablet 2   meclizine (ANTIVERT) 25 MG tablet Take 25 mg by mouth 3 (three) times daily as needed for dizziness.     Multiple Vitamin (DAILY VITAMIN) tablet Take 1 tablet by mouth daily. For low Vitamin     NONFORMULARY OR COMPOUNDED ITEM Compounded hemorrhoid cream from [facility] called in on 08/30/22     ondansetron (ZOFRAN) 4 MG tablet Take 4 mg by mouth every 8 (eight) hours as needed for nausea or vomiting.     polyethylene glycol powder (GLYCOLAX/MIRALAX) powder TAKE 34 GRAMS (TWO CAPFULS) IN  8 OZ OF FLUID TWICE DAILY  5   No current facility-administered medications for this visit.    Allergies as of 04/21/2023 - Review Complete 04/21/2023  Allergen Reaction Noted   Penicillins Hives 07/11/2011   Promethazine hcl Nausea And Vomiting 07/11/2011    Family History  Problem Relation Age of Onset   Hypertension Mother    Heart disease Mother    COPD Mother    Depression Mother    Anxiety disorder Mother    Heart disease Father    Diabetes Father    Hypertension Father    Anxiety disorder Father    Depression Father    Heart disease Sister    Depression Sister    Anxiety disorder Sister    Healthy Son    Healthy Daughter    Depression Sister    Anxiety disorder Sister    Depression Cousin    Depression Cousin    Depression Cousin     Social History   Socioeconomic History   Marital status: Married    Spouse name: Not on file   Number of children: Not on file   Years of education: Not on file   Highest education level: Not on file  Occupational History   Not on file  Tobacco Use   Smoking status: Never   Smokeless  tobacco: Never  Vaping Use   Vaping status: Never Used  Substance and Sexual Activity   Alcohol use: No    Alcohol/week: 1.0 standard drink of alcohol    Types: 1 Standard drinks or equivalent per week    Comment: rare 1-2 drinks per month, 04-27-16 per pt no and stopped 2 months ago   Drug use: No    Comment: 04-27-16 per pt no   Sexual activity: Yes    Partners: Male    Birth control/protection: Surgical    Comment: hyst  Other Topics Concern   Not on file  Social History Narrative   Not on file   Social Determinants of Health   Financial Resource Strain: Low Risk  (11/13/2021)   Overall Financial Resource Strain (CARDIA)    Difficulty of Paying Living Expenses: Not very hard  Food Insecurity: No Food Insecurity (11/13/2021)   Hunger Vital Sign    Worried About Running Out of Food in the Last Year: Never true    Ran Out of Food in the Last Year: Never true  Transportation Needs: No Transportation Needs (11/13/2021)   PRAPARE - Administrator, Civil Service (Medical): No    Lack of Transportation (Non-Medical): No  Physical Activity: Inactive (11/13/2021)   Exercise Vital Sign    Days of Exercise per Week: 0 days    Minutes of Exercise per Session: 0 min  Stress: No Stress Concern Present (11/13/2021)   [employer] of Occupational Health - Occupational Stress Questionnaire    Feeling of Stress : Only a little  Social Connections: Moderately Integrated (11/13/2021)   Social Connection and Isolation Panel [NHANES]    Frequency of Communication with Friends and Family: More than three times a week    Frequency of Social Gatherings with Friends and Family: Twice a week    Attends Religious Services: More than 4 times per year    Active Member of [employer] or Organizations: No    Attends Banker Meetings: Never    Marital Status: Married    Review of systems General: negative for malaise, night sweats, fever, chills, weight los Neck: Negative for lumps,  goiter, pain and significant neck swelling Resp: Negative for cough, wheezing, dyspnea at rest CV: Negative for chest pain, leg swelling, palpitations, orthopnea GI: denies melena, vomiting, constipation, dysphagia, odyonophagia, early satiety or unintentional weight loss. +rectal bleeding +diarrhea +low back pain with defecation +gerd symptoms +nausea +ruq/epigastric pain  MSK: Negative for joint pain or swelling, back pain, and muscle pain. Derm: Negative for itching or rash Psych: Denies depression, anxiety, memory loss, confusion. No homicidal or suicidal ideation.  Heme: Negative for prolonged bleeding, bruising easily, and swollen nodes. Endocrine: Negative for cold or heat intolerance, polyuria, polydipsia and goiter. Neuro: negative for tremor, gait imbalance, syncope and seizures. The remainder of the review of systems is noncontributory.  Physical Exam: LMP 05/27/2015  General:   Alert and oriented. No distress noted. Pleasant and cooperative.  Head:  Normocephalic and atraumatic. Eyes:  Conjuctiva clear without scleral icterus. Mouth:  Oral mucosa pink and moist. Good dentition. No lesions. Heart: Normal rate and rhythm, s1 and s2 heart sounds present.  Lungs: Clear lung sounds in all lobes. Respirations equal and unlabored. Abdomen:  +BS, soft, non-tender and non-distended. No rebound or guarding. No HSM or masses noted. Derm: No palmar erythema or jaundice Msk:  Symmetrical without gross deformities. Normal posture. Extremities:  Without edema. Neurologic:  Alert and  oriented x4 Psych:  Alert and cooperative. Normal mood and affect.  Invalid input(s): "6 MONTHS"   ASSESSMENT: MERLA SAWKA is a 57 y.o. female presenting today for follow up of GERD, RUQ/epigastric pain and rectal bleeding  GERD: on nexium 40mg  daily, famotidine 20mg  PRN, using famotidine a few times per week sometimes taking rolaids as well, may benefit from increasing H2B dose to 40mg . Will continue  PPI daily, famotidine 40mg  PRN, good reflux precautions, she should make me aware if symptoms persist. May be a candidate for TIF as symptoms somewhat refractory to medical therapy.   Rectal bleeding/change in bowel habits/lower back pain: ongoing, but appears worse. Notably having bleeding daily. Significant history of hemorrhoids with hemorrhoidectomy in the past. Notably with some stool changes, more looser stools/diarrhea now and endorses some back pressure at times of defecation. Had recommended patient consider colonoscopy at last visit but she did not wish to pursue, would strongly recommend updating colonoscopy given her rectal bleeding, changes in bowel habits and back pain with BMs. I briefly discussed hemorrhoid banding and will provide a brochure for her, if colonoscopy reveals bleeding coming from hemorrhoids would strong recommend she consider banding in the future.   RUQ/epigastric pain/history of sphincter of Oddi dysfunction: last ERCP 2013. chronic, intermittent epigastric/RUQ pain with some nausea. She did not complete CMP ordered at last OV. Will order CMP to look at liver enzymes. She can continue to use bentyl as needed for her pain for now. If she has vomiting, severe pain, jaundice, she needs to let me know immediately.    PLAN:  CBC, CMP  2. Schedule colonoscopy-ASA II   3. Continue with bentyl PRN 4. Continue hemorrhoid cream  5. Nexium 40mg  continued daily 6. Increase famotidine to 40mg , PRN   7. Hemorrhoid banding brochure, consider scheduling after TCS  All questions were answered, patient verbalized understanding and is in agreement with plan as outlined above.   Follow Up: 6 months    L. Jeanmarie Hubert, MSN, APRN, AGNP-C Adult-Gerontology Nurse Practitioner [facility]  I have reviewed the note and agree with the APP's assessment as described in this progress note  Katrinka Blazing, MD Gastroenterology and  Hepatology [facility] Gastroenterology

## 2023-04-22 LAB — CBC
Hematocrit: 37.2 % (ref 34.0–46.6)
Hemoglobin: 12.4 g/dL (ref 11.1–15.9)
MCH: 28.3 pg (ref 26.6–33.0)
MCHC: 33.3 g/dL (ref 31.5–35.7)
MCV: 85 fL (ref 79–97)
Platelets: 353 10*3/uL (ref 150–450)
RBC: 4.38 x10E6/uL (ref 3.77–5.28)
RDW: 14.6 % (ref 11.7–15.4)
WBC: 7.3 10*3/uL (ref 3.4–10.8)

## 2023-04-22 LAB — COMPREHENSIVE METABOLIC PANEL
ALT: 20 IU/L (ref 0–32)
AST: 21 IU/L (ref 0–40)
Albumin: 4.5 g/dL (ref 3.8–4.9)
Alkaline Phosphatase: 73 IU/L (ref 44–121)
BUN/Creatinine Ratio: 19 (ref 9–23)
BUN: 18 mg/dL (ref 6–24)
Bilirubin Total: 0.3 mg/dL (ref 0.0–1.2)
CO2: 24 mmol/L (ref 20–29)
Calcium: 10.2 mg/dL (ref 8.7–10.2)
Chloride: 99 mmol/L (ref 96–106)
Creatinine, Ser: 0.95 mg/dL (ref 0.57–1.00)
Globulin, Total: 2.9 g/dL (ref 1.5–4.5)
Glucose: 97 mg/dL (ref 70–99)
Potassium: 4.9 mmol/L (ref 3.5–5.2)
Sodium: 138 mmol/L (ref 134–144)
Total Protein: 7.4 g/dL (ref 6.0–8.5)
eGFR: 69 mL/min/{1.73_m2} (ref >59)

## 2023-04-27 ENCOUNTER — Ambulatory Visit (HOSPITAL_COMMUNITY): Payer: BLUE CROSS/BLUE SHIELD | Admitting: Psychiatry

## 2023-04-27 ENCOUNTER — Encounter (HOSPITAL_COMMUNITY): Payer: Self-pay | Admitting: Psychiatry

## 2023-04-27 VITALS — BP 123/76 | HR 84 | Ht 63.0 in | Wt 176.6 lb

## 2023-04-27 DIAGNOSIS — F332 Major depressive disorder, recurrent severe without psychotic features: Secondary | ICD-10-CM | POA: Diagnosis not present

## 2023-04-27 MED ORDER — HYDROXYZINE HCL 50 MG PO TABS
50.0000 mg | ORAL_TABLET | Freq: Three times a day (TID) | ORAL | 2 refills | Status: DC | PRN
Start: 2023-04-27 — End: 2023-10-31

## 2023-04-27 MED ORDER — DULOXETINE HCL 30 MG PO CPEP: 30.0000 mg | ORAL_CAPSULE | Freq: Every day | ORAL | 3 refills | Status: DC

## 2023-04-27 NOTE — Progress Notes (Signed)
BH MD/PA/NP OP Progress Note  04/27/2023 2:19 PM Marie Hurley  MRN:  [id number]  Chief Complaint:  Chief Complaint  Patient presents with   Depression   Anxiety   Follow-up   HPI: This patient is a 57 year old married white female who lives with her husband in [place].  She is on disability for migraine headaches and irritable bowel syndrome.  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  The patient returns for follow-up in person after 1 year.  Overall she continues to do well in terms of her depression and anxiety.  She denies significant depressive symptoms.  She is sleeping well.  Her energy is generally good.  She is spending a lot of time gardening and working in her greenhouse.  The hydroxyzine continues to help her anxiety.  She denies any thoughts of self-harm or suicide Visit Diagnosis:    ICD-10-CM   1. Severe episode of recurrent major depressive disorder, without psychotic features (HCC)  F33.2       Past Psychiatric History: Admission in May 2017 for angry agitated behavior in the context of substance abuse  Past Medical History:  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 %   Migraine    PONV (postoperative nausea and vomiting)    Seasonal allergies    Severe major depressive disorder (HCC)    Suicidal behavior     Past Surgical History:  Procedure Laterality Date   ABDOMINAL HYSTERECTOMY     CARPAL TUNNEL RELEASE     rt   CHOLECYSTECTOMY  2002   lap choli   COLONOSCOPY     COLONOSCOPY WITH PROPOFOL N/A 02/07/2014   Procedure: COLONOSCOPY WITH PROPOFOL;  Surgeon:  Malissa Hippo, MD;  Location: [facility];  Service: Endoscopy;  Laterality: N/A;  entered cecum @ 0756 ; total cecal withdrawal time- 9 min   CYSTOCELE REPAIR N/A 06/10/2015   Procedure: ANTERIOR REPAIR (CYSTOCELE);  Surgeon: Tilda Burrow, MD;  Location: [facility];  Service: Gynecology;  Laterality: N/A;   DIAGNOSTIC LAPAROSCOPY  2007   exploratory-nic bowel duct   DILATION AND CURETTAGE OF UTERUS  '91 & '96   ERCP  2005,2007,2013   several   HEMORROIDECTOMY     RECTOCELE REPAIR N/A 06/10/2015   Procedure: POSTERIOR REPAIR (RECTOCELE);  Surgeon: Tilda Burrow, MD;  Location: [facility];  Service: Gynecology;  Laterality: N/A;   SHOULDER ARTHROSCOPY WITH SUBACROMIAL DECOMPRESSION, ROTATOR CUFF REPAIR AND BICEP TENDON REPAIR Right 05/24/2014   Procedure: RIGHT SHOULDER ARTHROSCOPY DEBRIDEMENT LIMITED, DECOMPRESSION SUBACROMIAL PARTIAL ACROMIOPLASTY WITH ROTATOR CUFF REPAIR;  Surgeon: Eulas Post, MD;  Location: [facility];  Service: Orthopedics;  Laterality: Right;   TUBAL LIGATION     VAGINAL HYSTERECTOMY N/A 06/10/2015   Procedure: HYSTERECTOMY VAGINAL;  Surgeon: Tilda Burrow, MD;  Location: [facility];  Service: Gynecology;  Laterality: N/A;    Family Psychiatric History: See below all  Family History:  Family History  Problem Relation Age of  Onset   Hypertension Mother    Heart disease Mother    COPD Mother    Depression Mother    Anxiety disorder Mother    Heart disease Father    Diabetes Father    Hypertension Father    Anxiety disorder Father    Depression Father    Heart disease Sister    Depression Sister    Anxiety disorder Sister    Healthy Son    Healthy Daughter    Depression Sister    Anxiety disorder Sister    Depression Cousin    Depression Cousin    Depression Cousin     Social History:  Social History   Socioeconomic History   Marital status: Married    Spouse name: Not on file   Number of children: Not on file   Years of education: Not on  file   Highest education level: Not on file  Occupational History   Not on file  Tobacco Use   Smoking status: Never    Passive exposure: Past   Smokeless tobacco: Never  Vaping Use   Vaping status: Never Used  Substance and Sexual Activity   Alcohol use: No    Alcohol/week: 1.0 standard drink of alcohol    Types: 1 Standard drinks or equivalent per week    Comment: rare 1-2 drinks per month, 04-27-16 per pt no and stopped 2 months ago   Drug use: No    Comment: 04-27-16 per pt no   Sexual activity: Yes    Partners: Male    Birth control/protection: Surgical    Comment: hyst  Other Topics Concern   Not on file  Social History Narrative   Not on file   Social Determinants of Health   Financial Resource Strain: Low Risk  (11/13/2021)   Overall Financial Resource Strain (CARDIA)    Difficulty of Paying Living Expenses: Not very hard  Food Insecurity: No Food Insecurity (11/13/2021)   Hunger Vital Sign    Worried About Running Out of Food in the Last Year: Never true    Ran Out of Food in the Last Year: Never true  Transportation Needs: No Transportation Needs (11/13/2021)   PRAPARE - Administrator, Civil Service (Medical): No    Lack of Transportation (Non-Medical): No  Physical Activity: Inactive (11/13/2021)   Exercise Vital Sign    Days of Exercise per Week: 0 days    Minutes of Exercise per Session: 0 min  Stress: No Stress Concern Present (11/13/2021)   [employer] of Occupational Health - Occupational Stress Questionnaire    Feeling of Stress : Only a little  Social Connections: Moderately Integrated (11/13/2021)   Social Connection and Isolation Panel [NHANES]    Frequency of Communication with Friends and Family: More than three times a week    Frequency of Social Gatherings with Friends and Family: Twice a week    Attends Religious Services: More than 4 times per year    Active Member of [employer] or Organizations: No    Attends Banker Meetings:  Never    Marital Status: Married    Allergies:  Allergies  Allergen Reactions   Penicillins Hives    Received 2GM Ancef preop with no reaction.     Promethazine Hcl Nausea And Vomiting    Metabolic Disorder Labs: No results found for: "HGBA1C", "MPG" No results found for: "PROLACTIN" Lab Results  Component Value Date   CHOL 231 (H) 07/22/2017   TRIG 76  07/22/2017   HDL 87 07/22/2017   CHOLHDL 2.7 07/22/2017   LDLCALC 129 (H) 07/22/2017   Lab Results  Component Value Date   TSH 1.060 07/22/2017    Therapeutic Level Labs: No results found for: "LITHIUM" No results found for: "VALPROATE" No results found for: "CBMZ"  Current Medications: Current Outpatient Medications  Medication Sig Dispense Refill   atorvastatin (LIPITOR) 10 MG tablet Take 10 mg by mouth daily.     dicyclomine (BENTYL) 20 MG tablet Take 1 tablet (20 mg total) by mouth 3 (three) times daily as needed for spasms. 90 tablet 2   esomeprazole (NEXIUM) 40 MG capsule TAKE ONE CAPSULE DAILY BEFORE BREAKFAST (INS ONLY COVERS 90 DAYS) 90 capsule 3   estradiol (ESTRACE) 1 MG tablet TAKE 1 TABLET BY MOUTH EVERY DAY 90 tablet 2   famotidine (PEPCID) 40 MG tablet Take 1 tablet (40 mg total) by mouth daily as needed for heartburn or indigestion. 90 tablet 3   hydrocortisone (ANUSOL-HC) 2.5 % rectal cream APPLY ONE APPLICATION RECTALLY TWICE DAILY 30 g 2   meclizine (ANTIVERT) 25 MG tablet Take 25 mg by mouth 3 (three) times daily as needed for dizziness.     Multiple Vitamin (DAILY VITAMIN) tablet Take 1 tablet by mouth daily. For low Vitamin     ondansetron (ZOFRAN) 4 MG tablet Take 4 mg by mouth every 8 (eight) hours as needed for nausea or vomiting.     polyethylene glycol powder (GLYCOLAX/MIRALAX) powder TAKE 34 GRAMS (TWO CAPFULS) IN 8 OZ OF FLUID TWICE DAILY  5   DULoxetine (CYMBALTA) 30 MG capsule Take 1 capsule (30 mg total) by mouth daily. 90 capsule 3   hydrOXYzine (ATARAX) 50 MG tablet Take 1 tablet (50 mg  total) by mouth 3 (three) times daily as needed. 270 tablet 2   No current facility-administered medications for this visit.     Musculoskeletal: Strength & Muscle Tone: within normal limits Gait & Station: normal Patient leans: N/A  Psychiatric Specialty Exam: Review of Systems  Gastrointestinal:  Positive for blood in stool.  Musculoskeletal:  Positive for back pain.  All other systems reviewed and are negative.   Blood pressure 123/76, pulse 84, height 5\' 3"  (1.6 m), weight 176 lb 9.6 oz (80.1 kg), last menstrual period 05/27/2015, SpO2 100%.Body mass index is 31.28 kg/m.  General Appearance: Casual and Fairly Groomed  Eye Contact:  Good  Speech:  Clear and Coherent  Volume:  Normal  Mood:  Euthymic  Affect:  Congruent  Thought Process:  Goal Directed  Orientation:  Full (Time, Place, and Person)  Thought Content: WDL   Suicidal Thoughts:  No  Homicidal Thoughts:  No  Memory:  Immediate;   Good Recent;   Good Remote;   Good  Judgement:  Good  Insight:  Good  Psychomotor Activity:  Normal  Concentration:  Concentration: Good and Attention Span: Good  Recall:  Good  Fund of Knowledge: Good  Language: Good  Akathisia:  No  Handed:  Right  AIMS (if indicated): not done  Assets:  Communication Skills Desire for Improvement Physical Health Resilience Social Support  ADL's:  Intact  Cognition: WNL  Sleep:  Good   Screenings: AIMS    Flowsheet Row [facility]  AIMS Total Score 0      GAD-7    Flowsheet Row Office Visit from 04/27/2023 in [facility] [place] Office Visit from 11/13/2021 in [facility]  Total GAD-7 Score 4 1      MDI    Flowsheet Row Office Visit from 04/27/2016 in [facility]  Total Score (max 50) 27      PHQ2-9    Flowsheet Row Office  Visit from 04/27/2023 in [facility] [place] Video Visit from 04/14/2022 in [facility] [place] Office Visit from 11/13/2021 in [facility] Video Visit from 11/06/2021 in [facility] [place] Video Visit from 05/11/2021 in [facility] at [facility] Total Score 1 0 0 0 0  PHQ-9 Total Score 4 -- 1 -- --      Flowsheet Row Video Visit from 04/14/2022 in [facility] [place] Video Visit from 11/06/2021 in [facility] at [place] Video Visit from 05/11/2021 in [facility] at [place]  C-SSRS RISK CATEGORY No Risk No Risk No Risk        Assessment and Plan: This patient is a 57 year old female with a history of depression anxiety and a remote history of substance use.  She continues to do well on her current regimen.  She will continue Cymbalta 60 mg daily for depression and hydroxyzine 50 mg up to 3 times daily as needed for anxiety.  She will return to see me in 6 months  Collaboration of Care: Collaboration of Care: Primary Care Provider AEB notes will be shared with PCP at patient's request  Patient/Guardian was advised Release of Information must be obtained prior to any record release in order to collaborate their care with an outside provider. Patient/Guardian was advised if they have not already done so to contact the registration department to sign all necessary forms in order for [place] to release information regarding their care.   Consent: Patient/Guardian gives verbal consent for treatment and assignment of benefits for services provided during this visit. Patient/Guardian expressed understanding and agreed to proceed.    Diannia Ruder, MD 04/27/2023, 2:19 PM

## 2023-05-03 ENCOUNTER — Other Ambulatory Visit: Payer: Self-pay | Admitting: Obstetrics & Gynecology

## 2023-05-05 ENCOUNTER — Telehealth: Payer: Self-pay | Admitting: *Deleted

## 2023-05-05 MED ORDER — CLENPIQ 10-3.5-12 MG-GM -GM/175ML PO SOLN: 1.0000 | ORAL | 0 refills | Status: DC

## 2023-05-05 NOTE — Telephone Encounter (Signed)
Spoke with pt. She has been scheduled for TCS with Dr. Levon Hedger, ASA 2 8/14. Aware will send rx for prep to [facility] and will send instructions to her also. She wants small volume prep.

## 2023-05-25 ENCOUNTER — Ambulatory Visit (HOSPITAL_COMMUNITY): Payer: Self-pay | Admitting: Anesthesiology

## 2023-05-25 ENCOUNTER — Encounter (HOSPITAL_COMMUNITY): Payer: Self-pay | Admitting: Gastroenterology

## 2023-05-25 ENCOUNTER — Ambulatory Visit (HOSPITAL_COMMUNITY)
Admission: RE | Admit: 2023-05-25 | Discharge: 2023-05-25 | Disposition: A | Discharge status: Home / Self Care | Payer: BLUE CROSS/BLUE SHIELD | Attending: Gastroenterology | Admitting: Gastroenterology

## 2023-05-25 ENCOUNTER — Other Ambulatory Visit: Payer: Self-pay

## 2023-05-25 ENCOUNTER — Encounter (HOSPITAL_COMMUNITY)
Admission: RE | Disposition: A | Discharge status: Home / Self Care | Payer: Self-pay | Source: Home / Self Care | Attending: Gastroenterology

## 2023-05-25 ENCOUNTER — Ambulatory Visit (HOSPITAL_COMMUNITY): Payer: BLUE CROSS/BLUE SHIELD | Admitting: Anesthesiology

## 2023-05-25 DIAGNOSIS — Z85828 Personal history of other malignant neoplasm of skin: Secondary | ICD-10-CM | POA: Diagnosis not present

## 2023-05-25 DIAGNOSIS — K644 Residual hemorrhoidal skin tags: Secondary | ICD-10-CM | POA: Insufficient documentation

## 2023-05-25 DIAGNOSIS — K589 Irritable bowel syndrome without diarrhea: Secondary | ICD-10-CM | POA: Insufficient documentation

## 2023-05-25 DIAGNOSIS — K625 Hemorrhage of anus and rectum: Secondary | ICD-10-CM | POA: Insufficient documentation

## 2023-05-25 DIAGNOSIS — N261 Atrophy of kidney (terminal): Secondary | ICD-10-CM | POA: Diagnosis not present

## 2023-05-25 DIAGNOSIS — M1990 Unspecified osteoarthritis, unspecified site: Secondary | ICD-10-CM | POA: Insufficient documentation

## 2023-05-25 DIAGNOSIS — K648 Other hemorrhoids: Secondary | ICD-10-CM | POA: Insufficient documentation

## 2023-05-25 DIAGNOSIS — K635 Polyp of colon: Secondary | ICD-10-CM | POA: Diagnosis not present

## 2023-05-25 HISTORY — PX: SUBMUCOSAL LIFTING INJECTION: SHX6855

## 2023-05-25 HISTORY — PX: COLONOSCOPY WITH PROPOFOL: SHX5780

## 2023-05-25 LAB — HM COLONOSCOPY

## 2023-05-25 SURGERY — COLONOSCOPY WITH PROPOFOL
Anesthesia: General

## 2023-05-25 MED ORDER — LIDOCAINE HCL (CARDIAC) PF 100 MG/5ML IV SOSY
PREFILLED_SYRINGE | INTRAVENOUS | Status: DC | PRN
  Administered 2023-05-25: 40 mg via INTRAVENOUS

## 2023-05-25 MED ORDER — ONDANSETRON HCL 4 MG/2ML IJ SOLN
INTRAMUSCULAR | Status: DC | PRN
  Administered 2023-05-25: 4 mg via INTRAVENOUS

## 2023-05-25 MED ORDER — ONDANSETRON HCL 4 MG/2ML IJ SOLN
INTRAMUSCULAR | Status: AC
  Filled 2023-05-25: qty 2

## 2023-05-25 MED ORDER — LACTATED RINGERS IV SOLN
INTRAVENOUS | Status: DC
  Administered 2023-05-25: 1000 mL via INTRAVENOUS

## 2023-05-25 MED ORDER — PROPOFOL 500 MG/50ML IV EMUL
INTRAVENOUS | Status: DC | PRN
  Administered 2023-05-25: 150 ug/kg/min via INTRAVENOUS

## 2023-05-25 MED ORDER — PROPOFOL 10 MG/ML IV BOLUS
INTRAVENOUS | Status: DC | PRN
  Administered 2023-05-25: 20 mg via INTRAVENOUS
  Administered 2023-05-25: 100 mg via INTRAVENOUS

## 2023-05-25 MED ORDER — STERILE WATER FOR IRRIGATION IR SOLN
Status: DC | PRN
  Administered 2023-05-25: 60 mL

## 2023-05-25 NOTE — Anesthesia Preprocedure Evaluation (Addendum)
Anesthesia Evaluation  Patient identified by MRN, date of birth, ID band Patient awake    Reviewed: Allergy & Precautions, H&P , NPO status , Patient's Chart, lab work & pertinent test results  History of Anesthesia Complications (+) PONV and history of anesthetic complications  Airway Mallampati: II  TM Distance: >3 FB Neck ROM: Full    Dental  (+) Dental Advisory Given   Pulmonary neg pulmonary ROS   Pulmonary exam normal breath sounds clear to auscultation       Cardiovascular negative cardio ROS Normal cardiovascular exam Rhythm:Regular Rate:Normal     Neuro/Psych  Headaches PSYCHIATRIC DISORDERS Anxiety Depression       GI/Hepatic Neg liver ROS,GERD  Medicated and Controlled,,  Endo/Other  negative endocrine ROS    Renal/GU Renal disease (right renal atrophy)  negative genitourinary   Musculoskeletal  (+) Arthritis , Osteoarthritis,    Abdominal   Peds negative pediatric ROS (+)  Hematology  (+) Blood dyscrasia, anemia   Anesthesia Other Findings   Reproductive/Obstetrics negative OB ROS                             Anesthesia Physical Anesthesia Plan  ASA: 2  Anesthesia Plan: General   Post-op Pain Management: Minimal or no pain anticipated   Induction: Intravenous  PONV Risk Score and Plan: 1 and Treatment may vary due to age or medical condition  Airway Management Planned: Nasal Cannula and Natural Airway  Additional Equipment:   Intra-op Plan:   Post-operative Plan:   Informed Consent: I have reviewed the patients History and Physical, chart, labs and discussed the procedure including the risks, benefits and alternatives for the proposed anesthesia with the patient or authorized representative who has indicated his/her understanding and acceptance.     Dental advisory given  Plan Discussed with: CRNA and Surgeon  Anesthesia Plan Comments:         Anesthesia Quick Evaluation

## 2023-05-25 NOTE — Anesthesia Postprocedure Evaluation (Signed)
Anesthesia Post Note  Patient: Marie Hurley  Procedure(s) Performed: COLONOSCOPY WITH PROPOFOL SUBMUCOSAL LIFTING INJECTION  Patient location during evaluation: Phase II Anesthesia Type: General Level of consciousness: awake and alert and oriented Pain management: pain level controlled Vital Signs Assessment: post-procedure vital signs reviewed and stable Respiratory status: spontaneous breathing, nonlabored ventilation and respiratory function stable Cardiovascular status: blood pressure returned to baseline and stable Postop Assessment: no apparent nausea or vomiting Anesthetic complications: no  No notable events documented.   Last Vitals:  Vitals:   05/25/23 1129 05/25/23 1132  BP: (!) 104/51 (!) 122/55  Pulse: 83 83  Resp: 15   Temp: 36.6 C   SpO2: 99% 100%    Last Pain:  Vitals:   05/25/23 1129  TempSrc: Oral  PainSc: 6                   C 

## 2023-05-25 NOTE — H&P (Signed)
Marie Hurley is an 57 y.o. female.   Chief Complaint: Rectal bleeding HPI: Marie Hurley is a 57 y.o. female with past medical history of GERD, IBS, anxiety, arthritis, BCC, depression, migraines, anemia, sphincter of Oddi dysfunction, coming for rectal bleeding.  The patient reports having blood in her stool every time she moves her bowels which is small in amount but regular.  Has some rectal pain with defecation.  No abdominal pain, fever, chills, abdominal distention, melena, nausea or vomiting.  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 %   Migraine    PONV (postoperative nausea and vomiting)    Seasonal allergies    Severe major depressive disorder (HCC)    Suicidal behavior     Past Surgical History:  Procedure Laterality Date   ABDOMINAL HYSTERECTOMY     CARPAL TUNNEL RELEASE Right    rt   CHOLECYSTECTOMY  10/11/2000   lap choli   COLONOSCOPY     COLONOSCOPY WITH PROPOFOL N/A 02/07/2014   Procedure: COLONOSCOPY WITH PROPOFOL;  Surgeon: Malissa Hippo, MD;  Location: [facility];  Service: Endoscopy;  Laterality: N/A;  entered cecum @ 0756 ; total cecal withdrawal time- 9 min   CYSTOCELE REPAIR N/A 06/10/2015   Procedure: ANTERIOR REPAIR (CYSTOCELE);  Surgeon: Tilda Burrow, MD;  Location: [facility];  Service: Gynecology;  Laterality: N/A;   DIAGNOSTIC LAPAROSCOPY  10/11/2005   exploratory-nic bowel duct   DILATION AND CURETTAGE OF UTERUS  '91 & '96   ERCP  2005,2007,2013   several   HEMORROIDECTOMY     RECTOCELE REPAIR N/A 06/10/2015   Procedure: POSTERIOR REPAIR (RECTOCELE);  Surgeon: Tilda Burrow, MD;  Location: [facility];  Service: Gynecology;  Laterality: N/A;   SHOULDER ARTHROSCOPY WITH SUBACROMIAL DECOMPRESSION, ROTATOR CUFF REPAIR AND BICEP TENDON REPAIR Right 05/24/2014   Procedure: RIGHT SHOULDER ARTHROSCOPY DEBRIDEMENT LIMITED, DECOMPRESSION SUBACROMIAL PARTIAL ACROMIOPLASTY WITH ROTATOR CUFF REPAIR;  Surgeon: Eulas Post, MD;  Location: [facility];  Service: Orthopedics;  Laterality: Right;   TUBAL LIGATION     VAGINAL HYSTERECTOMY N/A 06/10/2015   Procedure: HYSTERECTOMY VAGINAL;  Surgeon: Tilda Burrow, MD;  Location: [facility];  Service: Gynecology;  Laterality: N/A;    Family History  Problem Relation Age of Onset   Hypertension Mother    Heart disease Mother    COPD Mother    Depression Mother    Anxiety disorder Mother    Heart disease Father    Diabetes Father    Hypertension Father    Anxiety disorder Father    Depression Father    Heart disease Sister    Depression Sister    Anxiety disorder Sister    Healthy Son    Healthy Daughter    Depression Sister    Anxiety disorder Sister    Depression Cousin    Depression Cousin    Depression Cousin    Social History:  reports that she has never smoked. She has been exposed to tobacco  smoke. She has never used smokeless tobacco. She reports that she does not drink alcohol and does not use drugs.  Allergies:  Allergies  Allergen Reactions   Penicillins Hives    Received 2GM Ancef preop with no reaction.     Promethazine Hcl Nausea And Vomiting    No medications prior to admission.    No results found for this or any previous visit (from the past 48 hour(s)). No results found.  Review of Systems  Gastrointestinal:  Positive for anal bleeding.  All other systems reviewed and are negative.   Blood pressure (!) 122/55, pulse 83, temperature 97.8 F (36.6 C), temperature source Oral, resp. rate 15, height 5\' 3"  (1.6 m), weight 78 kg, last menstrual period 05/27/2015, SpO2  100%. Physical Exam  GENERAL: The patient is AO x3, in no acute distress. HEENT: Head is normocephalic and atraumatic. EOMI are intact. Mouth is well hydrated and without lesions. NECK: Supple. No masses LUNGS: Clear to auscultation. No presence of rhonchi/wheezing/rales. Adequate chest expansion HEART: RRR, normal s1 and s2. ABDOMEN: Soft, nontender, no guarding, no peritoneal signs, and nondistended. BS +. No masses. EXTREMITIES: Without any cyanosis, clubbing, rash, lesions or edema. NEUROLOGIC: AOx3, no focal motor deficit. SKIN: no jaundice, no rashes  Assessment/Plan Marie Hurley is a 57 y.o. female with past medical history of GERD, IBS, anxiety, arthritis, BCC, depression, migraines, anemia, sphincter of Oddi dysfunction, coming for rectal bleeding.  Will proceed with colonoscopy.  Dolores Frame, MD 05/25/2023, 3:08 PM

## 2023-05-25 NOTE — Op Note (Addendum)
[facility] Patient Name: Marie Hurley Procedure Date: 05/25/2023 10:50 AM MRN: [id number] Date of Birth: Mar 03, 1966 Attending MD: Katrinka Blazing , , [id number] CSN: [id number] Age: 57 Admit Type: Outpatient Procedure:                Colonoscopy Indications:              Rectal bleeding Providers:                Katrinka Blazing, Crystal Page, Lennice Sites                            Technician, Technician Referring MD:              Medicines:                Monitored Anesthesia Care Complications:            No immediate complications. Estimated Blood Loss:     Estimated blood loss: none. Procedure:                Pre-Anesthesia Assessment:                           - Prior to the procedure, a History and Physical                            was performed, and patient medications, allergies                            and sensitivities were reviewed. The patient's                            tolerance of previous anesthesia was reviewed.                           - The risks and benefits of the procedure and the                            sedation options and risks were discussed with the                            patient. All questions were answered and informed                            consent was obtained.                           - ASA Grade Assessment: II - A patient with mild                            systemic disease.                           After obtaining informed consent, the colonoscope                            was passed under direct vision. Throughout the  procedure, the patient's blood pressure, pulse, and                            oxygen saturations were monitored continuously. The                            [id number]-[id number] ([id number]) scope was introduced through                            the anus and advanced to the the cecum, identified                            by appendiceal orifice and ileocecal valve. The                             colonoscopy was performed without difficulty. The                            patient tolerated the procedure well. The quality                            of the bowel preparation was excellent. Scope In: 11:03:18 AM Scope Out: 11:26:36 AM Scope Withdrawal Time: 0 hours 15 minutes 31 seconds  Total Procedure Duration: 0 hours 23 minutes 18 seconds  Findings:      External hemorrhoids were found on perianal exam.      A 10 mm polypoid lesion was found in the proximal ascending colon. The       lesion was semi-sessile. No bleeding was present. Area was successfully       injected with 4 mL [place] for lesion assessment adequately. The lesion       completely dissapeared after injection, it was likely normal polypoid       colonic mucosa.      Internal hemorrhoids were found during retroflexion. The hemorrhoids       were large and one had an eroded surface. Impression:               - Hemorrhoids found on perianal exam.                           - Polypoid lesion in the proximal ascending colon.                            Injected but not resected.                           - Internal hemorrhoids.                           - No specimens collected. Moderate Sedation:      Per Anesthesia Care Recommendation:           - Discharge patient to home (ambulatory).                           - Resume previous diet.                           -  Repeat colonoscopy in 10 years for screening                            purposes.                           - Patient not interested in hemorrhoidal banding                            for now, will hold off on referral. Procedure Code(s):        --- Professional ---                           [phone], Colonoscopy, flexible; with directed                            submucosal injection(s), any substance Diagnosis Code(s):        --- Professional ---                           D49.0, Neoplasm of unspecified behavior of                            digestive  system                           K64.8, Other hemorrhoids                           K62.5, Hemorrhage of anus and rectum CPT copyright 2022 American Medical Association. All rights reserved. The codes documented in this report are preliminary and upon coder review may  be revised to meet current compliance requirements. Katrinka Blazing, MD Katrinka Blazing,  05/25/2023 11:34:39 AM This report has been signed electronically. Number of Addenda: 0

## 2023-05-25 NOTE — Progress Notes (Signed)
Pt c/o nausea and "beginning of Migraine" upon waking from procedure.  Zofran given by CRNA. Pt now denies nausea.  Continues to c/o headache but states "it's probably from lack of sleep and not eating and will be better once I eat something." Has history of migraines.

## 2023-05-25 NOTE — Transfer of Care (Signed)
Immediate Anesthesia Transfer of Care Note  Patient: Marie Hurley  Procedure(s) Performed: COLONOSCOPY WITH PROPOFOL SUBMUCOSAL LIFTING INJECTION  Patient Location: Short Stay  Anesthesia Type:General  Level of Consciousness: awake, alert , and oriented  Airway & Oxygen Therapy: Patient Spontanous Breathing  Post-op Assessment: Report given to RN and Post -op Vital signs reviewed and stable  Post vital signs: Reviewed and stable  Last Vitals:  Vitals Value Taken Time  BP 104/51 05/25/23 1129  Temp 36.6 C 05/25/23 1129  Pulse 83 05/25/23 1129  Resp 15 05/25/23 1129  SpO2 99 % 05/25/23 1129    Last Pain:  Vitals:   05/25/23 1129  TempSrc: Oral  PainSc: 6       Patients Stated Pain Goal: 4 (05/25/23 0937)  Complications: No notable events documented.

## 2023-05-25 NOTE — Discharge Instructions (Addendum)
You are being discharged to home.  Resume your previous diet.  Your physician has recommended a repeat colonoscopy in 10 years for screening purposes.  Will proceed with hemorrhoidal banding at the office.

## 2023-05-26 ENCOUNTER — Encounter (INDEPENDENT_AMBULATORY_CARE_PROVIDER_SITE_OTHER): Payer: Self-pay | Admitting: *Deleted

## 2023-06-01 ENCOUNTER — Encounter (HOSPITAL_COMMUNITY): Payer: Self-pay | Admitting: Gastroenterology

## 2023-08-02 ENCOUNTER — Ambulatory Visit (INDEPENDENT_AMBULATORY_CARE_PROVIDER_SITE_OTHER): Payer: BLUE CROSS/BLUE SHIELD | Admitting: Gastroenterology

## 2023-08-15 ENCOUNTER — Ambulatory Visit (INDEPENDENT_AMBULATORY_CARE_PROVIDER_SITE_OTHER): Payer: BLUE CROSS/BLUE SHIELD | Admitting: Gastroenterology

## 2023-10-31 ENCOUNTER — Telehealth (HOSPITAL_COMMUNITY): Payer: BLUE CROSS/BLUE SHIELD | Admitting: Psychiatry

## 2023-10-31 ENCOUNTER — Encounter (HOSPITAL_COMMUNITY): Payer: Self-pay | Admitting: Psychiatry

## 2023-10-31 DIAGNOSIS — F332 Major depressive disorder, recurrent severe without psychotic features: Secondary | ICD-10-CM | POA: Diagnosis not present

## 2023-10-31 MED ORDER — DULOXETINE HCL 30 MG PO CPEP: 30.0000 mg | ORAL_CAPSULE | Freq: Every day | ORAL | 3 refills | Status: DC

## 2023-10-31 MED ORDER — HYDROXYZINE HCL 50 MG PO TABS: 50.0000 mg | ORAL_TABLET | Freq: Two times a day (BID) | ORAL | 2 refills | Status: DC | PRN

## 2023-10-31 NOTE — Progress Notes (Signed)
Virtual Visit via Telephone Note  I connected with Marie Hurley on 10/31/23 at 11:00 AM EST by telephone and verified that I am speaking with the correct person using two identifiers.  Location: Patient: home Provider: office   I discussed the limitations, risks, security and privacy concerns of performing an evaluation and management service by telephone and the availability of in person appointments. I also discussed with the patient that there may be a patient responsible charge related to this service. The patient expressed understanding and agreed to proceed.      I discussed the assessment and treatment plan with the patient. The patient was provided an opportunity to ask questions and all were answered. The patient agreed with the plan and demonstrated an understanding of the instructions.   The patient was advised to call back or seek an in-person evaluation if the symptoms worsen or if the condition fails to improve as anticipated.  I provided 20 minutes of non-face-to-face time during this encounter.   Diannia Ruder, MD  [facility] MD/PA/NP OP Progress Note  10/31/2023 11:15 AM Marie Hurley  MRN:  [id number]  Chief Complaint:  Chief Complaint  Patient presents with   Depression   Anxiety   Follow-up   HPI: This patient is a 58 year old married white female who lives with her husband in [place]. She is on disability for migraine headaches and irritable bowel syndrome. 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   The patient returns for follow-up regarding her depression and anxiety after 6 months.  She states overall she is doing fairly well.  She has some flu symptoms right now but other than that her health is been good.  She is sleeping well and denies significant depression anxiety or thoughts of self-harm.  She is enjoying time with her family. Visit Diagnosis:    ICD-10-CM   1. Severe episode of recurrent major depressive disorder, without  psychotic features (HCC)  F33.2       Past Psychiatric History: Admission in May 2017 for angry agitated behavior in the context of substance abuse  Past Medical History:  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 %   Migraine    PONV (postoperative nausea and vomiting)    Seasonal allergies    Severe major depressive disorder (HCC)    Suicidal behavior     Past Surgical History:  Procedure Laterality Date   ABDOMINAL HYSTERECTOMY     CARPAL TUNNEL RELEASE Right    rt   CHOLECYSTECTOMY  10/11/2000   lap choli   COLONOSCOPY     COLONOSCOPY WITH PROPOFOL N/A 02/07/2014   Procedure: COLONOSCOPY WITH PROPOFOL;  Surgeon: Malissa Hippo, MD;  Location: [facility];  Service: Endoscopy;  Laterality: N/A;  entered cecum @ 0756 ; total cecal withdrawal time- 9 min   COLONOSCOPY WITH PROPOFOL N/A 05/25/2023   Procedure: COLONOSCOPY WITH PROPOFOL;  Surgeon: Dolores Frame, MD;  Location: [facility];  Service: Gastroenterology;  Laterality:  N/A;  11:00am, asa 2   CYSTOCELE REPAIR N/A 06/10/2015   Procedure: ANTERIOR REPAIR (CYSTOCELE);  Surgeon: Tilda Burrow, MD;  Location: [facility];  Service: Gynecology;  Laterality: N/A;   DIAGNOSTIC LAPAROSCOPY  10/11/2005   exploratory-nic bowel duct   DILATION AND CURETTAGE OF UTERUS  '91 & '96   ERCP  2005,2007,2013   several   HEMORROIDECTOMY     RECTOCELE REPAIR N/A 06/10/2015   Procedure: POSTERIOR REPAIR (RECTOCELE);  Surgeon: Tilda Burrow, MD;  Location: [facility];  Service: Gynecology;  Laterality: N/A;   SHOULDER ARTHROSCOPY WITH SUBACROMIAL  DECOMPRESSION, ROTATOR CUFF REPAIR AND BICEP TENDON REPAIR Right 05/24/2014   Procedure: RIGHT SHOULDER ARTHROSCOPY DEBRIDEMENT LIMITED, DECOMPRESSION SUBACROMIAL PARTIAL ACROMIOPLASTY WITH ROTATOR CUFF REPAIR;  Surgeon: Eulas Post, MD;  Location: [facility];  Service: Orthopedics;  Laterality: Right;   SUBMUCOSAL LIFTING INJECTION  05/25/2023   Procedure: SUBMUCOSAL LIFTING INJECTION;  Surgeon: Dolores Frame, MD;  [facility];  Service: Gastroenterology;;   TUBAL LIGATION     VAGINAL HYSTERECTOMY N/A 06/10/2015   Procedure: HYSTERECTOMY VAGINAL;  Surgeon: Tilda Burrow, MD;  Location: [facility];  Service: Gynecology;  Laterality: N/A;    Family Psychiatric History: See below  Family History:  Family History  Problem Relation Age of Onset   Hypertension Mother    Heart disease Mother    COPD Mother    Depression Mother    Anxiety disorder Mother    Heart disease Father    Diabetes Father    Hypertension Father    Anxiety disorder Father    Depression Father    Heart disease Sister    Depression Sister    Anxiety disorder Sister    Healthy Son    Healthy Daughter    Depression Sister    Anxiety disorder Sister    Depression Cousin    Depression Cousin    Depression Cousin     Social History:  Social History   Socioeconomic History   Marital status: Married    Spouse name: Not on file   Number of children: Not on file   Years of education: Not on file   Highest education level: Not on file  Occupational History   Not on file  Tobacco Use   Smoking status: Never    Passive exposure: Past   Smokeless tobacco: Never  Vaping Use   Vaping status: Never Used  Substance and Sexual Activity   Alcohol use: No    Alcohol/week: 1.0 standard drink of alcohol    Types: 1 Standard drinks or equivalent per week    Comment: rare 1-2 drinks per month, 04-27-16 per pt no and stopped 2 months ago   Drug use: No    Comment: 04-27-16 per pt  no   Sexual activity: Yes    Partners: Male    Birth control/protection: Surgical    Comment: hyst  Other Topics Concern   Not on file  Social History Narrative   Not on file   Social Drivers of Health   Financial Resource Strain: Low Risk  (11/13/2021)   Overall Financial Resource Strain (CARDIA)    Difficulty of Paying Living Expenses: Not very hard  Food Insecurity: No Food Insecurity (11/13/2021)   Hunger Vital Sign    Worried About Running Out of Food in the Last Year: Never true    Ran Out of Food in the Last Year: Never true  Transportation Needs: No Transportation Needs (11/13/2021)  PRAPARE - Administrator, Civil Service (Medical): No    Lack of Transportation (Non-Medical): No  Physical Activity: Inactive (11/13/2021)   Exercise Vital Sign    Days of Exercise per Week: 0 days    Minutes of Exercise per Session: 0 min  Stress: No Stress Concern Present (11/13/2021)   [employer] of Occupational Health - Occupational Stress Questionnaire    Feeling of Stress : Only a little  Social Connections: Moderately Integrated (11/13/2021)   Social Connection and Isolation Panel [NHANES]    Frequency of Communication with Friends and Family: More than three times a week    Frequency of Social Gatherings with Friends and Family: Twice a week    Attends Religious Services: More than 4 times per year    Active Member of [employer] or Organizations: No    Attends Banker Meetings: Never    Marital Status: Married    Allergies:  Allergies  Allergen Reactions   Penicillins Hives    Received 2GM Ancef preop with no reaction.     Promethazine Hcl Nausea And Vomiting    Metabolic Disorder Labs: No results found for: "HGBA1C", "MPG" No results found for: "PROLACTIN" Lab Results  Component Value Date   CHOL 231 (H) 07/22/2017   TRIG 76 07/22/2017   HDL 87 07/22/2017   CHOLHDL 2.7 07/22/2017   LDLCALC 129 (H) 07/22/2017   Lab Results  Component Value Date    TSH 1.060 07/22/2017    Therapeutic Level Labs: No results found for: "LITHIUM" No results found for: "VALPROATE" No results found for: "CBMZ"  Current Medications: Current Outpatient Medications  Medication Sig Dispense Refill   atorvastatin (LIPITOR) 10 MG tablet Take 10 mg by mouth daily.     dicyclomine (BENTYL) 20 MG tablet Take 1 tablet (20 mg total) by mouth 3 (three) times daily as needed for spasms. 90 tablet 2   DULoxetine (CYMBALTA) 30 MG capsule Take 1 capsule (30 mg total) by mouth daily. 90 capsule 3   esomeprazole (NEXIUM) 40 MG capsule TAKE ONE CAPSULE DAILY BEFORE BREAKFAST (INS ONLY COVERS 90 DAYS) 90 capsule 3   estradiol (ESTRACE) 1 MG tablet TAKE 1 TABLET BY MOUTH EVERY DAY 90 tablet 2   famotidine (PEPCID) 40 MG tablet Take 1 tablet (40 mg total) by mouth daily as needed for heartburn or indigestion. 90 tablet 3   hydrocortisone (ANUSOL-HC) 2.5 % rectal cream APPLY ONE APPLICATION RECTALLY TWICE DAILY 30 g 2   hydrOXYzine (ATARAX) 50 MG tablet Take 1 tablet (50 mg total) by mouth 2 (two) times daily as needed. Dosage decrease 180 tablet 2   meclizine (ANTIVERT) 25 MG tablet Take 25 mg by mouth 3 (three) times daily as needed for dizziness.     Multiple Vitamin (DAILY VITAMIN) tablet Take 1 tablet by mouth daily. For low Vitamin     ondansetron (ZOFRAN) 4 MG tablet Take 4 mg by mouth every 8 (eight) hours as needed for nausea or vomiting.     polyethylene glycol powder (GLYCOLAX/MIRALAX) powder TAKE 34 GRAMS (TWO CAPFULS) IN 8 OZ OF FLUID TWICE DAILY  5   No current facility-administered medications for this visit.     Musculoskeletal: Strength & Muscle Tone: na Gait & Station: na Patient leans: N/A  Psychiatric Specialty Exam: Review of Systems  Musculoskeletal:  Positive for arthralgias.  All other systems reviewed and are negative.   Last menstrual period 05/27/2015.There is no height or weight on file to calculate BMI.  General Appearance: NA  Eye  Contact:  NA  Speech:  Clear and Coherent  Volume:  Normal  Mood:  Euthymic  Affect:  Congruent  Thought Process:  Goal Directed  Orientation:  Full (Time, Place, and Person)  Thought Content: WDL   Suicidal Thoughts:  No  Homicidal Thoughts:  No  Memory:  Immediate;   Good Recent;   Good Remote;   Good  Judgement:  Good  Insight:  Good  Psychomotor Activity:  Normal  Concentration:  Concentration: Good and Attention Span: Good  Recall:  Good  Fund of Knowledge: Good  Language: Good  Akathisia:  No  Handed:  Right  AIMS (if indicated): not done  Assets:  Communication Skills Desire for Improvement Physical Health Resilience Social Support Talents/Skills  ADL's:  Intact  Cognition: WNL  Sleep:  Good   Screenings: AIMS    Flowsheet Row [facility]  AIMS Total Score 0      GAD-7    Flowsheet Row Office Visit from 04/27/2023 in [facility] [place] Office Visit from 11/13/2021 in [facility]  Total GAD-7 Score 4 1      MDI    Flowsheet Row Office Visit from 04/27/2016 in [facility]  Total Score (max 50) 27      PHQ2-9    Flowsheet Row Office Visit from 04/27/2023 in [facility] at [place] Video Visit from 04/14/2022 in [facility] at [place] Office Visit from 11/13/2021 in [facility] Video Visit from 11/06/2021 in [facility] [place] Video Visit from 05/11/2021 in [facility] at [facility] Total Score 1 0 0 0 0  PHQ-9 Total Score 4 -- 1 -- --      Flowsheet Row Admission (Discharged) from 05/25/2023 in [facility] Video Visit from 04/14/2022 in [facility] at  [place] Video Visit from 11/06/2021 in [facility] at [place]  C-SSRS RISK CATEGORY No Risk No Risk No Risk        Assessment and Plan: This patient is a 58 year old female with a history of depression anxiety and a remote history of substance use.  She continues to do well on her current regimen.  She will continue Cymbalta 30 mg daily for depression.  She states she only uses the hydroxyzine 50 mg up to twice daily for anxiety so this will be continued.  She will return to see me in 6 months  Collaboration of Care: Collaboration of Care: Primary Care Provider AEB notes to be shared with PCP at patient's request  Patient/Guardian was advised Release of Information must be obtained prior to any record release in order to collaborate their care with an outside provider. Patient/Guardian was advised if they have not already done so to contact the registration department to sign all necessary forms in order for [place] to release information regarding their care.   Consent: Patient/Guardian gives verbal consent for treatment and assignment of benefits for services provided during this visit. Patient/Guardian expressed understanding and agreed to proceed.    Diannia Ruder, MD 10/31/2023, 11:15 AM

## 2023-11-07 ENCOUNTER — Other Ambulatory Visit (HOSPITAL_COMMUNITY): Payer: Self-pay | Admitting: Internal Medicine

## 2023-11-07 DIAGNOSIS — Z1231 Encounter for screening mammogram for malignant neoplasm of breast: Secondary | ICD-10-CM

## 2023-11-09 ENCOUNTER — Ambulatory Visit (HOSPITAL_COMMUNITY): Payer: BLUE CROSS/BLUE SHIELD

## 2023-11-09 ENCOUNTER — Ambulatory Visit (HOSPITAL_COMMUNITY)
Admission: RE | Admit: 2023-11-09 | Discharge: 2023-11-09 | Disposition: A | Discharge status: Home / Self Care | Payer: BLUE CROSS/BLUE SHIELD | Source: Ambulatory Visit | Attending: Internal Medicine | Admitting: Internal Medicine

## 2023-11-09 DIAGNOSIS — Z1231 Encounter for screening mammogram for malignant neoplasm of breast: Secondary | ICD-10-CM | POA: Diagnosis present

## 2023-11-14 ENCOUNTER — Other Ambulatory Visit (HOSPITAL_COMMUNITY): Payer: Self-pay | Admitting: Internal Medicine

## 2023-11-14 DIAGNOSIS — R928 Other abnormal and inconclusive findings on diagnostic imaging of breast: Secondary | ICD-10-CM

## 2023-11-15 ENCOUNTER — Ambulatory Visit (HOSPITAL_COMMUNITY)
Admission: RE | Admit: 2023-11-15 | Discharge: 2023-11-15 | Disposition: A | Discharge status: Home / Self Care | Payer: BLUE CROSS/BLUE SHIELD | Source: Ambulatory Visit | Attending: Internal Medicine | Admitting: Internal Medicine

## 2023-11-15 ENCOUNTER — Encounter (HOSPITAL_COMMUNITY): Payer: Self-pay

## 2023-11-15 DIAGNOSIS — R928 Other abnormal and inconclusive findings on diagnostic imaging of breast: Secondary | ICD-10-CM | POA: Insufficient documentation

## 2023-12-15 ENCOUNTER — Other Ambulatory Visit: Payer: Self-pay | Admitting: Obstetrics & Gynecology

## 2024-01-13 ENCOUNTER — Other Ambulatory Visit (INDEPENDENT_AMBULATORY_CARE_PROVIDER_SITE_OTHER): Payer: Self-pay

## 2024-01-13 DIAGNOSIS — K644 Residual hemorrhoidal skin tags: Secondary | ICD-10-CM

## 2024-01-13 DIAGNOSIS — K649 Unspecified hemorrhoids: Secondary | ICD-10-CM

## 2024-01-13 DIAGNOSIS — K625 Hemorrhage of anus and rectum: Secondary | ICD-10-CM

## 2024-01-13 MED ORDER — HYDROCORTISONE (PERIANAL) 2.5 % EX CREA: TOPICAL_CREAM | Freq: Two times a day (BID) | CUTANEOUS | 2 refills | Status: AC

## 2024-03-01 NOTE — Progress Notes (Addendum)
 COVID Vaccine received:  []  No [x]  Yes Date of any COVID positive Test in last 90 days:  none  PCP -   Alvia Awkward, MD  Cardiologist - none  Chest x-ray -  EKG -02-01-14 Epic  repeated   03-02-24 Stress Test -  ECHO -  Cardiac Cath -  CT Coronary Calcium score:   Pacemaker / ICD device [x]  No []  Yes   Spinal Cord Stimulator:[x]  No []  Yes       History of Sleep Apnea? [x]  No []  Yes   CPAP used?- [x]  No []  Yes    Does the patient monitor blood sugar?   [x]  N/A   []  No []  Yes  Patient has: [x]  NO Hx DM   []  Pre-DM   []  DM1  []   DM2  Blood Thinner / Instructions: None Aspirin Instructions:  none  ERAS Protocol Ordered: []  No  []  Yes PRE-SURGERY []  ENSURE  []  G2   []  No Drink Ordered Patient is to be NPO after:  1030    NO ORDERS AT PST   Dental hx: []  Dentures:  [x]  N/A      []  Bridge or Partial:                   []  Loose or Damaged teeth:   Comments: Patient was given the 5 CHG shower / bath instructions for Reverse Shoulder arthroplasty surgery along with 2 bottles of the CHG soap. Patient will start this on: 03-04-24          Activity level: Able to walk up 2 flights of stairs without becoming significantly short of breath or having chest pain?  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   Patient had untreated TB at 59 yo, always tests positive   Patient denies shortness of breath, fever, cough and chest pain at PAT appointment.  Patient verbalized understanding and agreement to the Pre-Surgical Instructions that were given to them at this PAT appointment. Patient was also educated of the need to review these PAT instructions again prior to her surgery.I reviewed the appropriate phone numbers to call if they have any and questions or concerns.

## 2024-03-01 NOTE — Patient Instructions (Addendum)
 SURGICAL WAITING ROOM VISITATION Patients having surgery or a procedure may have no more than 2 support people in the waiting area - these visitors may rotate in the visitor waiting room.   If the patient needs to stay at the hospital during part of their recovery, the visitor guidelines for inpatient rooms apply.  PRE-OP VISITATION  Pre-op nurse will coordinate an appropriate time for 1 support person to accompany the patient in pre-op.  This support person may not rotate.  This visitor will be contacted when the time is appropriate for the visitor to come back in the pre-op area.  Please refer to the [facility] website for the visitor guidelines for Inpatients (after your surgery is over and you are in a regular room).  You are not required to quarantine at this time prior to your surgery. However, you must do this: Hand Hygiene often Do NOT share personal items Notify your provider if you are in close contact with someone who has COVID or you develop fever 100.4 or greater, new onset of sneezing, cough, sore throat, shortness of breath or body aches.  If you test positive for Covid or have been in contact with anyone that has tested positive in the last 10 days please notify you surgeon.    Your procedure is scheduled on:  Mar 08, 2024  Report to [facility] Main Entrance: [address] entrance where the [employer] is available.   Report to admitting at: 11:00 AM  Call this number if you have any questions or problems the morning of surgery [phone]  Do not eat food after Midnight the night prior to your surgery/procedure.  After Midnight you may have the following liquids until   10:30  AM  DAY OF SURGERY  Clear Liquid Diet Water  Black Coffee (sugar ok, NO MILK/CREAM OR CREAMERS)  Tea (sugar ok, NO MILK/CREAM OR CREAMERS) regular and decaf                             Plain Jell-O  with no fruit (NO RED)                                           Fruit ices (not with  fruit pulp, NO RED)                                     Popsicles (NO RED)                                                                  Juice: NO CITRUS JUICES: only apple, WHITE grape, WHITE cranberry Sports drinks like Gatorade or Powerade (NO RED)                FOLLOW ANY ADDITIONAL PRE OP INSTRUCTIONS YOU RECEIVED FROM YOUR SURGEON'S OFFICE!!!   Oral Hygiene is also important to reduce your risk of infection.        Remember - BRUSH YOUR TEETH THE MORNING OF SURGERY WITH YOUR REGULAR TOOTHPASTE  Do NOT smoke after  Midnight the night before surgery.  STOP TAKING all Vitamins, Herbs and supplements 1 week before your surgery.   Take ONLY these medicines the morning of surgery with A SIP OF WATER : esomeprazole , duloxetine  and you may take Tylenol  for pain.                    You may not have any metal on your body including hair pins, jewelry, and body piercing  Do not wear make-up, lotions, powders, perfumes or deodorant  Do not wear nail polish including gel and S&S, artificial / acrylic nails, or any other type of covering on natural nails including finger and toenails. If you have artificial nails, gel coating, etc., that needs to be removed by a nail salon, Please have this removed prior to surgery. Not doing so may mean that your surgery could be cancelled or delayed if the Surgeon or anesthesia staff feels like they are unable to monitor you safely.   Do not shave 48 hours prior to surgery to avoid nicks in your skin which may contribute to postoperative infections.   Contacts, Hearing Aids, dentures or bridgework may not be worn into surgery. DENTURES WILL BE REMOVED PRIOR TO SURGERY PLEASE DO NOT APPLY "Poly grip" OR ADHESIVES!!!  You may bring a small overnight bag with you on the day of surgery, only pack items that are not valuable. [facility] IS NOT RESPONSIBLE   FOR VALUABLES THAT ARE LOST OR STOLEN.   Patients discharged on the day of surgery will not be allowed to  drive home.  Someone NEEDS to stay with you for the first 24 hours after anesthesia.  Do not bring your home medications to the hospital. The Pharmacy will dispense medications listed on your medication list to you during your admission in the Hospital.  Special Instructions: Bring a copy of your healthcare power of attorney and living will documents the day of surgery, if you wish to have them scanned into your [facility]- EPIC  Please read over the following fact sheets you were given: IF YOU HAVE QUESTIONS ABOUT YOUR PRE-OP INSTRUCTIONS, PLEASE CALL [phone].     Pre-operative 5 CHG Bath Instructions   You can play a key role in reducing the risk of infection after surgery. Your skin needs to be as free of germs as possible. You can reduce the number of germs on your skin by washing with CHG (chlorhexidine  gluconate) soap before surgery. CHG is an antiseptic soap that kills germs and continues to kill germs even after washing.   DO NOT use if you have an allergy to chlorhexidine /CHG or antibacterial soaps. If your skin becomes reddened or irritated, stop using the CHG and notify one of our RNs at [phone]  Please shower with the CHG soap starting 4 days before surgery using the following schedule: START SHOWERS ON   SUNDAY  Mar 04, 2024  Please keep in mind the following:  DO NOT shave, including legs and underarms, starting the day of your first shower.   You may shave your face at any point before/day of surgery.   Place clean sheets on your bed the day you start using CHG soap. Use a clean washcloth (not used since being washed) for each shower. DO NOT sleep with pets once you start using the CHG.   CHG Shower Instructions:  If you choose to wash your hair and private area, wash first  with your normal shampoo/soap.  After you use shampoo/soap, rinse your hair and body thoroughly to remove shampoo/soap residue.  Turn the water  OFF and apply about 3 tablespoons (45 ml) of CHG soap to a CLEAN washcloth.  Apply CHG soap ONLY FROM YOUR NECK DOWN TO YOUR TOES (washing for 3-5 minutes)  DO NOT use CHG soap on face, private areas, open wounds, or sores.  Pay special attention to the area where your surgery is being performed.  If you are having back surgery, having someone wash your back for you may be helpful.  Wait 2 minutes after CHG soap is applied, then you may rinse off the CHG soap.  Pat dry with a clean towel  Put on clean clothes/pajamas   If you choose to wear lotion, please use ONLY the CHG-compatible lotions on the back of this paper.     Additional instructions for the day of surgery: DO NOT APPLY any lotions, deodorants, cologne, or perfumes.   Put on clean/comfortable clothes.  Brush your teeth.  Ask your nurse before applying any prescription medications to the skin.       Preparing for Total Shoulder Arthroplasty ================================================================= Please follow these instructions carefully, in addition to any other special Bathing information that was explained to you at the Presurgical Appointment:  BENZOYL PEROXIDE 5% GEL: Used to kill bacteria on the skin which could cause an infection at the surgery site.   Please do not use if you have an allergy to benzoyl peroxide. If your skin becomes reddened/irritated stop using the benzoyl peroxide and inform your Doctor.   Starting two days before surgery, apply as follows:  1. Apply benzoyl peroxide gel in the morning and at night. Apply after taking a shower. If you are not taking a shower, clean entire shoulder front, back, and side, along with the armpit with a clean wet washcloth.  2. Place a quarter-sized dollop of the gel on your SHOULDER and rub in thoroughly, making  sure to cover the front, back, and side of your shoulder, along with the armpit.   2 Days prior to Surgery    TUESDAY Mar 06, 2024 First Application _______ Morning Second Application _______ Night  Day Before Surgery    WEDNESDAY  Mar 07, 2024 First Application______ Morning  On the night before surgery, wash your entire body (except hair, face and private areas) with CHG Soap. THEN, rub in the LAST application of the Benzoyl Peroxide Gel on your shoulder.   3. On the Morning of Surgery wash your BODY AGAIN with CHG Soap (except hair, face and private areas)  4. DO NOT USE THE BENZOYL PEROXIDE GEL ON THE DAY OF YOUR SURGERY            CHG Compatible Lotions   Aveeno Moisturizing lotion  Cetaphil Moisturizing Cream  Cetaphil Moisturizing Lotion  Clairol Herbal Essence Moisturizing Lotion, Dry Skin  Clairol Herbal Essence Moisturizing Lotion, Extra Dry Skin  Clairol Herbal Essence  Moisturizing Lotion, Normal Skin  Curel Age Defying Therapeutic Moisturizing Lotion with Alpha Hydroxy  Curel Extreme Care Body Lotion  Curel Soothing Hands Moisturizing Hand Lotion  Curel Therapeutic Moisturizing Cream, Fragrance-Free  Curel Therapeutic Moisturizing Lotion, Fragrance-Free  Curel Therapeutic Moisturizing Lotion, Original Formula  Eucerin Daily Replenishing Lotion  Eucerin Dry Skin Therapy Plus Alpha Hydroxy Crme  Eucerin Dry Skin Therapy Plus Alpha Hydroxy Lotion  Eucerin Original Crme  Eucerin Original Lotion  Eucerin Plus Crme Eucerin Plus Lotion  Eucerin TriLipid Replenishing Lotion  Keri Anti-Bacterial Hand Lotion  Keri Deep Conditioning Original Lotion Dry Skin Formula Softly Scented  Keri Deep Conditioning Original Lotion, Fragrance Free Sensitive Skin Formula  Keri Lotion Fast Absorbing Fragrance Free Sensitive Skin Formula  Keri Lotion Fast Absorbing Softly Scented Dry Skin Formula  Keri Original Lotion  Keri Skin Renewal Lotion Keri Silky Smooth Lotion  Keri  Silky Smooth Sensitive Skin Lotion  Nivea Body Creamy Conditioning Oil  Nivea Body Extra Enriched Lotion  Nivea Body Original Lotion  Nivea Body Sheer Moisturizing Lotion Nivea Crme  Nivea Skin Firming Lotion  NutraDerm 30 Skin Lotion  NutraDerm Skin Lotion  NutraDerm Therapeutic Skin Cream  NutraDerm Therapeutic Skin Lotion  ProShield Protective Hand Cream  Provon moisturizing lotion   FAILURE TO FOLLOW THESE INSTRUCTIONS MAY RESULT IN THE CANCELLATION OF YOUR SURGERY  PATIENT SIGNATURE_________________________________  NURSE SIGNATURE__________________________________  ________________________________________________________________________

## 2024-03-02 ENCOUNTER — Encounter (HOSPITAL_COMMUNITY)
Admission: RE | Admit: 2024-03-02 | Discharge: 2024-03-02 | Disposition: A | Discharge status: Home / Self Care | Source: Ambulatory Visit | Attending: Orthopedic Surgery | Admitting: Orthopedic Surgery

## 2024-03-02 ENCOUNTER — Other Ambulatory Visit: Payer: Self-pay

## 2024-03-02 ENCOUNTER — Encounter (HOSPITAL_COMMUNITY): Payer: Self-pay

## 2024-03-02 VITALS — BP 144/82 | HR 88 | Temp 98.7°F | Resp 16 | Ht 63.0 in | Wt 166.0 lb

## 2024-03-02 DIAGNOSIS — Z79899 Other long term (current) drug therapy: Secondary | ICD-10-CM | POA: Diagnosis not present

## 2024-03-02 DIAGNOSIS — Z01812 Encounter for preprocedural laboratory examination: Secondary | ICD-10-CM | POA: Diagnosis present

## 2024-03-02 DIAGNOSIS — Z8614 Personal history of Methicillin resistant Staphylococcus aureus infection: Secondary | ICD-10-CM

## 2024-03-02 DIAGNOSIS — Z01818 Encounter for other preprocedural examination: Secondary | ICD-10-CM | POA: Diagnosis not present

## 2024-03-02 DIAGNOSIS — N289 Disorder of kidney and ureter, unspecified: Secondary | ICD-10-CM | POA: Diagnosis not present

## 2024-03-02 DIAGNOSIS — Z01810 Encounter for preprocedural cardiovascular examination: Secondary | ICD-10-CM | POA: Diagnosis present

## 2024-03-02 HISTORY — DX: Respiratory tuberculosis unspecified: A15.9

## 2024-03-02 HISTORY — DX: Pneumonia, unspecified organism: J18.9

## 2024-03-02 LAB — CBC
HCT: 35.3 % — ABNORMAL LOW (ref 36.0–46.0)
Hemoglobin: 11.1 g/dL — ABNORMAL LOW (ref 12.0–15.0)
MCH: 26.2 pg (ref 26.0–34.0)
MCHC: 31.4 g/dL (ref 30.0–36.0)
MCV: 83.5 fL (ref 80.0–100.0)
Platelets: 392 10*3/uL (ref 150–400)
RBC: 4.23 MIL/uL (ref 3.87–5.11)
RDW: 15.8 % — ABNORMAL HIGH (ref 11.5–15.5)
WBC: 7.5 10*3/uL (ref 4.0–10.5)
nRBC: 0 % (ref 0.0–0.2)

## 2024-03-02 LAB — COMPREHENSIVE METABOLIC PANEL WITH GFR
ALT: 17 U/L (ref 0–44)
AST: 20 U/L (ref 15–41)
Albumin: 3.7 g/dL (ref 3.5–5.0)
Alkaline Phosphatase: 61 U/L (ref 38–126)
Anion gap: 9 (ref 5–15)
BUN: 17 mg/dL (ref 6–20)
CO2: 24 mmol/L (ref 22–32)
Calcium: 10 mg/dL (ref 8.9–10.3)
Chloride: 103 mmol/L (ref 98–111)
Creatinine, Ser: 0.43 mg/dL — ABNORMAL LOW (ref 0.44–1.00)
GFR, Estimated: >60 mL/min (ref >60)
Glucose, Bld: 92 mg/dL (ref 70–99)
Potassium: 4.6 mmol/L (ref 3.5–5.1)
Sodium: 136 mmol/L (ref 135–145)
Total Bilirubin: 0.6 mg/dL (ref 0.0–1.2)
Total Protein: 7.7 g/dL (ref 6.5–8.1)

## 2024-03-02 LAB — SURGICAL PCR SCREEN
MRSA, PCR: NEGATIVE
Staphylococcus aureus: NEGATIVE

## 2024-03-08 ENCOUNTER — Encounter (HOSPITAL_COMMUNITY): Admission: RE | Payer: Self-pay | Source: Ambulatory Visit

## 2024-03-08 ENCOUNTER — Ambulatory Visit (HOSPITAL_COMMUNITY): Admission: RE | Admit: 2024-03-08 | Source: Ambulatory Visit | Admitting: Orthopedic Surgery

## 2024-03-08 SURGERY — ARTHROPLASTY, SHOULDER, TOTAL, REVERSE
Anesthesia: General | Site: Shoulder | Laterality: Right

## 2024-05-01 ENCOUNTER — Encounter (HOSPITAL_COMMUNITY): Payer: Self-pay | Admitting: Psychiatry

## 2024-05-01 ENCOUNTER — Telehealth (INDEPENDENT_AMBULATORY_CARE_PROVIDER_SITE_OTHER): Payer: BLUE CROSS/BLUE SHIELD | Admitting: Psychiatry

## 2024-05-01 DIAGNOSIS — F332 Major depressive disorder, recurrent severe without psychotic features: Secondary | ICD-10-CM

## 2024-05-01 MED ORDER — DULOXETINE HCL 30 MG PO CPEP: 30.0000 mg | ORAL_CAPSULE | Freq: Every day | ORAL | 3 refills | Status: AC

## 2024-05-01 NOTE — Progress Notes (Signed)
 Virtual Visit via Telephone Note  I connected with Marie Hurley on 05/01/24 at 11:20 AM EDT by telephone and verified that I am speaking with the correct person using two identifiers.  Location: Patient: home Provider: office   I discussed the limitations, risks, security and privacy concerns of performing an evaluation and management service by telephone and the availability of in person appointments. I also discussed with the patient that there may be a patient responsible charge related to this service. The patient expressed understanding and agreed to proceed.     I discussed the assessment and treatment plan with the patient. The patient was provided an opportunity to ask questions and all were answered. The patient agreed with the plan and demonstrated an understanding of the instructions.   The patient was advised to call back or seek an in-person evaluation if the symptoms worsen or if the condition fails to improve as anticipated.  I provided 20 minutes of non-face-to-face time during this encounter.   Barnie Gull, MD  [facility] MD/PA/NP OP Progress Note  05/01/2024 11:33 AM Marie Hurley  MRN:  [id number]  Chief Complaint:  Chief Complaint  Patient presents with   Anxiety   Depression   Follow-up   HPI: This patient is a 58 year old married white female who lives with her husband in [place]. She is on disability for migraine headaches and irritable bowel syndrome. 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  The patient returns for follow-up regarding her depression and anxiety after 6 months.  She states that she had reverse shoulder surgery about 7 weeks ago.  The first 2 weeks were pretty rough and she was in pain but now she is doing much better and is attending physical therapy.  She states that her mood has been good and she denies significant depression or anxiety.  Now that the pain is decreased she is sleeping well.  She denies depression anxiety  or thoughts of self-harm.  She is rarely using the hydroxyzine . Visit Diagnosis:    ICD-10-CM   1. Severe episode of recurrent major depressive disorder, without psychotic features (HCC)  F33.2       Past Psychiatric History: Admission in May 2017 for angry agitated behavior in the context of substance abuse  Past Medical History:  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 %   Migraine    Pneumonia    PONV (postoperative nausea and vomiting)    Seasonal allergies    Severe major depressive disorder (HCC)    Suicidal behavior    Tuberculosis    at age 31,   tests positive always,  gets xray    Past Surgical History:  Procedure Laterality Date   CARPAL TUNNEL RELEASE Right    rt   CHOLECYSTECTOMY  10/11/2000   lap choli   COLONOSCOPY     COLONOSCOPY WITH PROPOFOL  N/A 02/07/2014   Procedure: COLONOSCOPY WITH PROPOFOL ;  Surgeon: Claudis RAYMOND Rivet, MD;  Location: [facility];  Service: Endoscopy;  Laterality: N/A;  entered cecum @ 0756 ; total cecal withdrawal time- 9 min   COLONOSCOPY WITH PROPOFOL  N/A 05/25/2023   Procedure: COLONOSCOPY WITH PROPOFOL ;  Surgeon: Eartha Angelia Sieving, MD;  [facility]: [facility];  Service: Gastroenterology;  Laterality: N/A;  11:00am, asa 2   CYSTOCELE REPAIR N/A 06/10/2015   Procedure: ANTERIOR REPAIR (CYSTOCELE);  Surgeon: Norleen Edsel GAILS, MD;  Location: [facility];  Service: Gynecology;  Laterality: N/A;   DIAGNOSTIC LAPAROSCOPY  10/11/2005   exploratory-nic bowel duct   DILATION AND CURETTAGE OF UTERUS  '91 & '96   ERCP  2005,2007,2013   several   HEMORROIDECTOMY      RECTOCELE REPAIR N/A 06/10/2015   Procedure: POSTERIOR REPAIR (RECTOCELE);  Surgeon: Norleen Edsel GAILS, MD;  Location: [facility];  Service: Gynecology;  Laterality: N/A;   SHOULDER ARTHROSCOPY WITH SUBACROMIAL DECOMPRESSION, ROTATOR CUFF REPAIR AND BICEP TENDON REPAIR Right 05/24/2014   Procedure: RIGHT SHOULDER ARTHROSCOPY DEBRIDEMENT LIMITED, DECOMPRESSION SUBACROMIAL PARTIAL ACROMIOPLASTY WITH ROTATOR CUFF REPAIR;  Surgeon: Fonda SHAUNNA Olmsted, MD;  Location: [facility];  Service: Orthopedics;  Laterality: Right;   SUBMUCOSAL LIFTING INJECTION  05/25/2023   Procedure: SUBMUCOSAL LIFTING INJECTION;  Surgeon: Eartha Angelia Sieving, MD;  [facility];  Service: Gastroenterology;;   TUBAL LIGATION     VAGINAL HYSTERECTOMY N/A 06/10/2015   Procedure: HYSTERECTOMY VAGINAL;  Surgeon: Norleen Edsel GAILS, MD;  Location: [facility];  Service: Gynecology;  Laterality: N/A;    Family Psychiatric History: See below  Family History:  Family History  Problem Relation Age of Onset   Hypertension Mother    Heart disease Mother    COPD Mother    Depression Mother    Anxiety disorder Mother    Heart disease Father    Diabetes Father    Hypertension Father    Anxiety disorder Father    Depression Father    Heart disease Sister    Depression Sister    Anxiety disorder Sister    Healthy Son    Healthy Daughter    Depression Sister    Anxiety disorder Sister    Depression Cousin    Depression Cousin    Depression Cousin     Social History:  Social History   Socioeconomic History   Marital status: Married    Spouse name: Not on file   Number of children: Not on file   Years of education: Not on file   Highest education level: Not on file  Occupational History   Not on file  Tobacco Use   Smoking status: Never    Passive exposure: Past   Smokeless tobacco: Never  Vaping Use   Vaping status: Never Used  Substance and Sexual Activity   Alcohol use: No    Alcohol/week:  1.0 standard drink of alcohol    Types: 1 Standard drinks or equivalent per week    Comment: stopped in 2017   Drug use: No    Comment: 03-02-24 per pt no   Sexual activity: Yes    Partners: Male    Birth control/protection: Surgical    Comment: hyst  Other Topics Concern   Not on file  Social History Narrative   Not on file   Social Drivers of Health   Financial Resource Strain: Low Risk  (11/13/2021)   Overall Financial Resource Strain (CARDIA)    Difficulty of Paying Living Expenses: Not very hard  Food Insecurity: No Food Insecurity (11/13/2021)   Hunger Vital Sign  Worried About Programme researcher, broadcasting/film/video in the Last Year: Never true    Ran Out of Food in the Last Year: Never true  Transportation Needs: No Transportation Needs (11/13/2021)   PRAPARE - Administrator, Civil Service (Medical): No    Lack of Transportation (Non-Medical): No  Physical Activity: Inactive (11/13/2021)   Exercise Vital Sign    Days of Exercise per Week: 0 days    Minutes of Exercise per Session: 0 min  Stress: No Stress Concern Present (11/13/2021)   [employer] of Occupational Health - Occupational Stress Questionnaire    Feeling of Stress : Only a little  Social Connections: Moderately Integrated (11/13/2021)   Social Connection and Isolation Panel    Frequency of Communication with Friends and Family: More than three times a week    Frequency of Social Gatherings with Friends and Family: Twice a week    Attends Religious Services: More than 4 times per year    Active Member of [employer] or Organizations: No    Attends Banker Meetings: Never    Marital Status: Married    Allergies:  Allergies  Allergen Reactions   Penicillins Hives    Received 2GM Ancef  preop with no reaction.     Promethazine  Hcl Nausea And Vomiting    Metabolic Disorder Labs: No results found for: HGBA1C, MPG No results found for: PROLACTIN Lab Results  Component Value Date   CHOL 231 (H)  07/22/2017   TRIG 76 07/22/2017   HDL 87 07/22/2017   CHOLHDL 2.7 07/22/2017   LDLCALC 129 (H) 07/22/2017   Lab Results  Component Value Date   TSH 1.060 07/22/2017    Therapeutic Level Labs: No results found for: LITHIUM No results found for: VALPROATE No results found for: CBMZ  Current Medications: Current Outpatient Medications  Medication Sig Dispense Refill   acetaminophen  (TYLENOL ) 500 MG tablet Take 500 mg by mouth every 6 (six) hours as needed for moderate pain (pain score 4-6).     cyclobenzaprine  (FLEXERIL ) 10 MG tablet Take 10 mg by mouth 2 (two) times daily as needed for muscle spasms.     dicyclomine  (BENTYL ) 20 MG tablet Take 1 tablet (20 mg total) by mouth 3 (three) times daily as needed for spasms. 90 tablet 2   DULoxetine  (CYMBALTA ) 30 MG capsule Take 1 capsule (30 mg total) by mouth daily. 90 capsule 3   esomeprazole  (NEXIUM ) 40 MG capsule TAKE ONE CAPSULE DAILY BEFORE BREAKFAST (INS ONLY COVERS 90 DAYS) 90 capsule 3   estradiol  (ESTRACE ) 1 MG tablet TAKE 1 TABLET BY MOUTH EVERY DAY 90 tablet 2   famotidine  (PEPCID ) 40 MG tablet Take 1 tablet (40 mg total) by mouth daily as needed for heartburn or indigestion. 90 tablet 3   fexofenadine (ALLEGRA) 180 MG tablet Take 180 mg by mouth daily as needed for allergies or rhinitis.     hydrocortisone  (ANUSOL -HC) 2.5 % rectal cream Place rectally 2 (two) times daily. (Patient taking differently: Place 1 Application rectally 2 (two) times daily as needed for hemorrhoids.) 30 g 2   hydrOXYzine  (ATARAX ) 50 MG tablet Take 1 tablet (50 mg total) by mouth 2 (two) times daily as needed. Dosage decrease (Patient taking differently: Take 50 mg by mouth 3 (three) times daily as needed.) 180 tablet 2   ibuprofen  (ADVIL ) 200 MG tablet Take 400-600 mg by mouth every 6 (six) hours as needed for moderate pain (pain score 4-6).     Iron -Vitamin C (VITRON-C  PO) Take 1 tablet by mouth daily.     meclizine (ANTIVERT) 25 MG tablet Take 25  mg by mouth daily as needed for dizziness.     Multiple Vitamin (DAILY VITAMIN) tablet Take 1 tablet by mouth daily. For low Vitamin     ondansetron  (ZOFRAN ) 4 MG tablet Take 4 mg by mouth daily as needed for nausea or vomiting.     polyethylene glycol powder (GLYCOLAX /MIRALAX ) powder Take 34 g by mouth daily. May take a second 34g dose as needed for constipation  5   pseudoephedrine-guaifenesin (MUCINEX D) 60-600 MG 12 hr tablet Take 1 tablet by mouth at bedtime as needed for congestion.     rosuvastatin (CRESTOR) 20 MG tablet Take 20 mg by mouth at bedtime.     No current facility-administered medications for this visit.     Musculoskeletal: Strength & Muscle Tone: na Gait & Station: na Patient leans: N/A  Psychiatric Specialty Exam: Review of Systems  Musculoskeletal:  Positive for arthralgias.  All other systems reviewed and are negative.   Last menstrual period 05/27/2015.There is no height or weight on file to calculate BMI.  General Appearance: NA  Eye Contact:  NA  Speech:  Clear and Coherent  Volume:  Normal  Mood:  Euthymic  Affect:  NA  Thought Process:  Goal Directed  Orientation:  Full (Time, Place, and Person)  Thought Content: WDL   Suicidal Thoughts:  No  Homicidal Thoughts:  No  Memory:  Immediate;   Good Recent;   Good Remote;   Good  Judgement:  Good  Insight:  Good  Psychomotor Activity:  Normal  Concentration:  Concentration: Good and Attention Span: Good  Recall:  Good  Fund of Knowledge: Good  Language: Good  Akathisia:  No  Handed:  Right  AIMS (if indicated): not done  Assets:  Communication Skills Desire for Improvement Physical Health Resilience Social Support Talents/Skills  ADL's:  Intact  Cognition: WNL  Sleep:  Good   Screenings: AIMS    Flowsheet Row [facility]  AIMS Total Score 0   GAD-7    Flowsheet Row Office Visit from 04/27/2023 in [facility] [place] Office Visit from 11/13/2021 in [facility]  Total GAD-7 Score 4 1   MDI    Flowsheet Row Office Visit from 04/27/2016 in [facility]  Total Score (max 50) 27   PHQ2-9    Flowsheet Row Office Visit from 04/27/2023 in [facility] [place] Video Visit from 04/14/2022 in [facility] at [place] Office Visit from 11/13/2021 in [facility] Video Visit from 11/06/2021 in [facility] at [place] Video Visit from 05/11/2021 in [facility] at [facility] Total Score 1 0 0 0 0  PHQ-9 Total Score 4 -- 1 -- --   Flowsheet Row Admission (Discharged) from 05/25/2023 in [facility] Video Visit from 04/14/2022 in [facility] at [place] Video Visit from 11/06/2021 in [facility] at [place]  C-SSRS RISK CATEGORY No Risk No Risk No Risk     Assessment and Plan: This patient is a 58 year old female with a history of depression anxiety and a remote history of substance use.  She continues to do well on her current  regimen.  She will continue Cymbalta  30 mg daily for depression and hydroxyzine  50 mg twice daily only as needed for anxiety.  She will return to see me in 6 months  Collaboration of Care: Collaboration of Care: Primary Care Provider AEB notes to be shared with PCP at patient's request  Patient/Guardian was advised Release of Information must be obtained prior to any record release in order to collaborate their care with an outside provider. Patient/Guardian was advised if they have not already done so to contact the registration department to sign all necessary forms in order for us  to release information regarding their care.   Consent:  Patient/Guardian gives verbal consent for treatment and assignment of benefits for services provided during this visit. Patient/Guardian expressed understanding and agreed to proceed.    Barnie Gull, MD 05/01/2024, 11:33 AM

## 2024-05-02 ENCOUNTER — Other Ambulatory Visit (INDEPENDENT_AMBULATORY_CARE_PROVIDER_SITE_OTHER): Payer: Self-pay | Admitting: Gastroenterology

## 2024-05-02 DIAGNOSIS — K219 Gastro-esophageal reflux disease without esophagitis: Secondary | ICD-10-CM

## 2024-05-02 NOTE — Telephone Encounter (Signed)
 Patient needs office visit for further refills. Last seen 04/21/2023. Please have the patient call the office for an appointment.

## 2024-05-02 NOTE — Telephone Encounter (Signed)
 Patient last seen 04/21/2023. Will need office visit for further fills.

## 2024-05-25 ENCOUNTER — Other Ambulatory Visit (INDEPENDENT_AMBULATORY_CARE_PROVIDER_SITE_OTHER): Payer: Self-pay | Admitting: Gastroenterology

## 2024-05-25 NOTE — Telephone Encounter (Signed)
 Last seen 04/21/2023. Needs office visit and none currently scheduled.

## 2024-05-29 ENCOUNTER — Other Ambulatory Visit (INDEPENDENT_AMBULATORY_CARE_PROVIDER_SITE_OTHER): Payer: Self-pay | Admitting: Gastroenterology

## 2024-06-25 ENCOUNTER — Other Ambulatory Visit (INDEPENDENT_AMBULATORY_CARE_PROVIDER_SITE_OTHER): Payer: Self-pay | Admitting: Gastroenterology

## 2024-06-25 ENCOUNTER — Other Ambulatory Visit (HOSPITAL_COMMUNITY): Payer: Self-pay | Admitting: Psychiatry

## 2024-06-25 NOTE — Telephone Encounter (Signed)
 Can you call pt and have her schedule follow up? Thank you!!

## 2024-06-27 NOTE — Telephone Encounter (Signed)
 Noted

## 2024-07-25 ENCOUNTER — Encounter (INDEPENDENT_AMBULATORY_CARE_PROVIDER_SITE_OTHER): Payer: Self-pay | Admitting: Gastroenterology

## 2024-11-14 ENCOUNTER — Other Ambulatory Visit (HOSPITAL_COMMUNITY): Payer: Self-pay | Admitting: Internal Medicine

## 2024-11-14 DIAGNOSIS — Z1231 Encounter for screening mammogram for malignant neoplasm of breast: Secondary | ICD-10-CM

## 2024-11-15 ENCOUNTER — Inpatient Hospital Stay (HOSPITAL_COMMUNITY): Admission: RE | Admit: 2024-11-15 | Discharge: 2024-11-15 | Attending: Internal Medicine | Admitting: Internal Medicine

## 2024-11-15 DIAGNOSIS — Z1231 Encounter for screening mammogram for malignant neoplasm of breast: Secondary | ICD-10-CM
